# Patient Record
Sex: Male | Born: 1960 | Race: White | Hispanic: No | Marital: Married | Smoking: Former smoker
Health system: Southern US, Community
[De-identification: ages and names within clinical notes are randomized; demographics above are authoritative.]

## PROBLEM LIST (undated history)

## (undated) DIAGNOSIS — C801 Malignant (primary) neoplasm, unspecified: Secondary | ICD-10-CM

## (undated) HISTORY — PX: COLONOSCOPY: SHX174

---

## 1999-02-26 ENCOUNTER — Ambulatory Visit (HOSPITAL_COMMUNITY): Admission: RE | Admit: 1999-02-26 | Discharge: 1999-02-26 | Payer: Self-pay | Admitting: Family Medicine

## 1999-02-26 ENCOUNTER — Encounter: Payer: Self-pay | Admitting: Family Medicine

## 2017-07-05 DIAGNOSIS — J029 Acute pharyngitis, unspecified: Secondary | ICD-10-CM | POA: Diagnosis not present

## 2017-07-11 DIAGNOSIS — J029 Acute pharyngitis, unspecified: Secondary | ICD-10-CM | POA: Diagnosis not present

## 2017-07-11 DIAGNOSIS — H6123 Impacted cerumen, bilateral: Secondary | ICD-10-CM | POA: Diagnosis not present

## 2017-07-21 DIAGNOSIS — D649 Anemia, unspecified: Secondary | ICD-10-CM | POA: Diagnosis not present

## 2017-07-21 DIAGNOSIS — R7301 Impaired fasting glucose: Secondary | ICD-10-CM | POA: Diagnosis not present

## 2017-07-26 DIAGNOSIS — Z0000 Encounter for general adult medical examination without abnormal findings: Secondary | ICD-10-CM | POA: Diagnosis not present

## 2017-07-26 DIAGNOSIS — M1710 Unilateral primary osteoarthritis, unspecified knee: Secondary | ICD-10-CM | POA: Diagnosis not present

## 2017-07-26 DIAGNOSIS — R7301 Impaired fasting glucose: Secondary | ICD-10-CM | POA: Diagnosis not present

## 2018-01-27 DIAGNOSIS — R7301 Impaired fasting glucose: Secondary | ICD-10-CM | POA: Diagnosis not present

## 2018-01-27 DIAGNOSIS — Z0000 Encounter for general adult medical examination without abnormal findings: Secondary | ICD-10-CM | POA: Diagnosis not present

## 2018-01-27 DIAGNOSIS — D509 Iron deficiency anemia, unspecified: Secondary | ICD-10-CM | POA: Diagnosis not present

## 2018-02-02 DIAGNOSIS — M2550 Pain in unspecified joint: Secondary | ICD-10-CM | POA: Diagnosis not present

## 2018-02-02 DIAGNOSIS — R7301 Impaired fasting glucose: Secondary | ICD-10-CM | POA: Diagnosis not present

## 2018-02-02 DIAGNOSIS — Z23 Encounter for immunization: Secondary | ICD-10-CM | POA: Diagnosis not present

## 2018-02-02 DIAGNOSIS — D649 Anemia, unspecified: Secondary | ICD-10-CM | POA: Diagnosis not present

## 2018-02-23 DIAGNOSIS — X32XXXA Exposure to sunlight, initial encounter: Secondary | ICD-10-CM | POA: Diagnosis not present

## 2018-02-23 DIAGNOSIS — L280 Lichen simplex chronicus: Secondary | ICD-10-CM | POA: Diagnosis not present

## 2018-02-23 DIAGNOSIS — L570 Actinic keratosis: Secondary | ICD-10-CM | POA: Diagnosis not present

## 2019-03-28 ENCOUNTER — Other Ambulatory Visit: Payer: Self-pay | Admitting: Pharmacist

## 2019-12-12 ENCOUNTER — Other Ambulatory Visit: Payer: Self-pay

## 2019-12-12 ENCOUNTER — Encounter (HOSPITAL_COMMUNITY): Payer: Self-pay

## 2019-12-12 ENCOUNTER — Inpatient Hospital Stay (HOSPITAL_COMMUNITY): Payer: 59 | Attending: Hematology | Admitting: Hematology

## 2019-12-12 ENCOUNTER — Inpatient Hospital Stay (HOSPITAL_COMMUNITY): Payer: 59

## 2019-12-12 ENCOUNTER — Encounter (HOSPITAL_COMMUNITY): Payer: Self-pay | Admitting: *Deleted

## 2019-12-12 ENCOUNTER — Encounter (HOSPITAL_COMMUNITY): Payer: Self-pay | Admitting: Hematology

## 2019-12-12 ENCOUNTER — Telehealth (HOSPITAL_COMMUNITY): Payer: Self-pay | Admitting: *Deleted

## 2019-12-12 VITALS — BP 124/70 | HR 88 | Temp 98.0°F | Resp 18 | Wt 226.2 lb

## 2019-12-12 DIAGNOSIS — D649 Anemia, unspecified: Secondary | ICD-10-CM | POA: Diagnosis not present

## 2019-12-12 DIAGNOSIS — Z8052 Family history of malignant neoplasm of bladder: Secondary | ICD-10-CM | POA: Insufficient documentation

## 2019-12-12 DIAGNOSIS — Z79899 Other long term (current) drug therapy: Secondary | ICD-10-CM | POA: Insufficient documentation

## 2019-12-12 DIAGNOSIS — D539 Nutritional anemia, unspecified: Secondary | ICD-10-CM | POA: Insufficient documentation

## 2019-12-12 DIAGNOSIS — R0602 Shortness of breath: Secondary | ICD-10-CM | POA: Insufficient documentation

## 2019-12-12 DIAGNOSIS — D696 Thrombocytopenia, unspecified: Secondary | ICD-10-CM | POA: Diagnosis not present

## 2019-12-12 DIAGNOSIS — R5383 Other fatigue: Secondary | ICD-10-CM | POA: Insufficient documentation

## 2019-12-12 LAB — CBC WITH DIFFERENTIAL/PLATELET
Band Neutrophils: 6 %
Basophils Absolute: 0 10*3/uL (ref 0.0–0.1)
Basophils Relative: 0 %
Blasts: 13 %
Eosinophils Absolute: 0 10*3/uL (ref 0.0–0.5)
Eosinophils Relative: 0 %
HCT: 18.5 % — ABNORMAL LOW (ref 39.0–52.0)
Hemoglobin: 6 g/dL — CL (ref 13.0–17.0)
Lymphocytes Relative: 24 %
Lymphs Abs: 2.5 10*3/uL (ref 0.7–4.0)
MCH: 42.9 pg — ABNORMAL HIGH (ref 26.0–34.0)
MCHC: 32.4 g/dL (ref 30.0–36.0)
MCV: 132.1 fL — ABNORMAL HIGH (ref 80.0–100.0)
Metamyelocytes Relative: 9 %
Monocytes Absolute: 0.8 10*3/uL (ref 0.1–1.0)
Monocytes Relative: 8 %
Myelocytes: 13 %
Neutro Abs: 2.7 10*3/uL (ref 1.7–7.7)
Neutrophils Relative %: 20 %
Platelets: 53 10*3/uL — ABNORMAL LOW (ref 150–400)
Promyelocytes Relative: 7 %
RBC: 1.4 MIL/uL — ABNORMAL LOW (ref 4.22–5.81)
RDW: 18.6 % — ABNORMAL HIGH (ref 11.5–15.5)
WBC: 10.3 10*3/uL (ref 4.0–10.5)
nRBC: 0 % (ref 0.0–0.2)

## 2019-12-12 LAB — SAVE SMEAR(SSMR), FOR PROVIDER SLIDE REVIEW

## 2019-12-12 LAB — RETICULOCYTES
Immature Retic Fract: 28.1 % — ABNORMAL HIGH (ref 2.3–15.9)
RBC.: 1.41 MIL/uL — ABNORMAL LOW (ref 4.22–5.81)
Retic Count, Absolute: 38.6 10*3/uL (ref 19.0–186.0)
Retic Ct Pct: 2.7 % (ref 0.4–3.1)

## 2019-12-12 LAB — DIRECT ANTIGLOBULIN TEST (NOT AT ARMC)
DAT, IgG: NEGATIVE
DAT, complement: NEGATIVE

## 2019-12-12 LAB — FOLATE: Folate: 26.9 ng/mL (ref >5.9)

## 2019-12-12 LAB — LACTATE DEHYDROGENASE: LDH: 311 U/L — ABNORMAL HIGH (ref 98–192)

## 2019-12-12 LAB — MAGNESIUM: Magnesium: 2 mg/dL (ref 1.7–2.4)

## 2019-12-12 LAB — ABO/RH: ABO/RH(D): O POS

## 2019-12-12 LAB — FIBRINOGEN: Fibrinogen: 338 mg/dL (ref 210–475)

## 2019-12-12 LAB — VITAMIN B12: Vitamin B-12: 388 pg/mL (ref 180–914)

## 2019-12-12 LAB — PREPARE RBC (CROSSMATCH)

## 2019-12-12 LAB — URIC ACID: Uric Acid, Serum: 5.3 mg/dL (ref 3.7–8.6)

## 2019-12-12 LAB — VITAMIN D 25 HYDROXY (VIT D DEFICIENCY, FRACTURES): Vit D, 25-Hydroxy: 40.54 ng/mL (ref 30–100)

## 2019-12-12 MED ORDER — SODIUM CHLORIDE 0.9% FLUSH: 10.0000 mL | INTRAVENOUS | Status: DC | PRN

## 2019-12-12 MED ORDER — ACETAMINOPHEN 325 MG PO TABS
650.0000 mg | ORAL_TABLET | Freq: Once | ORAL | Status: AC
  Administered 2019-12-12: 650 mg via ORAL

## 2019-12-12 MED ORDER — HEPARIN SOD (PORK) LOCK FLUSH 100 UNIT/ML IV SOLN: 250.0000 [IU] | INTRAVENOUS | Status: DC | PRN

## 2019-12-12 MED ORDER — SODIUM CHLORIDE 0.9% FLUSH: 3.0000 mL | INTRAVENOUS | Status: DC | PRN

## 2019-12-12 MED ORDER — ACETAMINOPHEN 325 MG PO TABS
ORAL_TABLET | ORAL | Status: AC
  Filled 2019-12-12: qty 2

## 2019-12-12 MED ORDER — DIPHENHYDRAMINE HCL 25 MG PO CAPS
25.0000 mg | ORAL_CAPSULE | Freq: Once | ORAL | Status: AC
  Administered 2019-12-12: 25 mg via ORAL

## 2019-12-12 MED ORDER — HEPARIN SOD (PORK) LOCK FLUSH 100 UNIT/ML IV SOLN: 500.0000 [IU] | Freq: Every day | INTRAVENOUS | Status: DC | PRN

## 2019-12-12 MED ORDER — DIPHENHYDRAMINE HCL 25 MG PO CAPS
ORAL_CAPSULE | ORAL | Status: AC
  Filled 2019-12-12: qty 1

## 2019-12-12 MED ORDER — SODIUM CHLORIDE 0.9% IV SOLUTION
250.0000 mL | Freq: Once | INTRAVENOUS | Status: AC
  Administered 2019-12-12: 250 mL via INTRAVENOUS

## 2019-12-12 NOTE — Progress Notes (Signed)
CONSULT NOTE  Patient Care Team: Celene Squibb, MD as PCP - General (Internal Medicine)  CHIEF COMPLAINTS/PURPOSE OF CONSULTATION: Anemia  HISTORY OF PRESENTING ILLNESS:  Hunter Smith 59 y.o. male was sent here by his PCP for anemia.  Patient went to his PCP for increasing shortness of breath and fatigue.  Labs were drawn and hemoglobin was 6.8.  He was sent here for a consult.  Patient denies any bleeding issues such as hematochezia, hematuria or epistaxis.  He does report he is more short of breath with any type of exertion.  He also has occasional chest pain with the exertion.  He reports he has been so fatigued that he is unable to do any physical activity lately.  He reports his job is very labor-intensive.  He denies any history of blood clots or ever needing a blood transfusion.  He only drinks alcohol socially.  He denies any smoking or illegal drugs.  He denies any B symptoms including fevers, chills, night sweats or unexplained weight loss.  He reports he did lose a few weight by diet recently.  But the weight loss was intentional.  He denies any nausea vomiting or diarrhea.  He denies any over-the-counter NSAIDs or antiplatelet agents.  He reports a family history of a father with bladder cancer.  He lives at home with his wife and performs all of his own ADLs.   MEDICAL HISTORY:  History reviewed. No pertinent past medical history.  SURGICAL HISTORY: Past Surgical History:  Procedure Laterality Date  . COLONOSCOPY      SOCIAL HISTORY: Social History   Socioeconomic History  . Marital status: Married    Spouse name: Not on file  . Number of children: Not on file  . Years of education: Not on file  . Highest education level: Not on file  Occupational History  . Occupation: Employed  Tobacco Use  . Smoking status: Former Smoker    Types: Cigarettes    Quit date: 05/03/1988    Years since quitting: 31.6  . Smokeless tobacco: Never Used  Substance and Sexual Activity  .  Alcohol use: Yes    Alcohol/week: 1.0 - 2.0 standard drink    Types: 1 - 2 Standard drinks or equivalent per week  . Drug use: Never  . Sexual activity: Yes  Other Topics Concern  . Not on file  Social History Narrative  . Not on file   Social Determinants of Health   Financial Resource Strain:   . Difficulty of Paying Living Expenses:   Food Insecurity:   . Worried About Charity fundraiser in the Last Year:   . Arboriculturist in the Last Year:   Transportation Needs:   . Film/video editor (Medical):   Marland Kitchen Lack of Transportation (Non-Medical):   Physical Activity:   . Days of Exercise per Week:   . Minutes of Exercise per Session:   Stress:   . Feeling of Stress :   Social Connections:   . Frequency of Communication with Friends and Family:   . Frequency of Social Gatherings with Friends and Family:   . Attends Religious Services:   . Active Member of Clubs or Organizations:   . Attends Archivist Meetings:   Marland Kitchen Marital Status:   Intimate Partner Violence:   . Fear of Current or Ex-Partner:   . Emotionally Abused:   Marland Kitchen Physically Abused:   . Sexually Abused:     FAMILY HISTORY: Family History  Problem Relation Age of Onset  . Bladder Cancer Father   . Scoliosis Sister     ALLERGIES:  has No Known Allergies.  MEDICATIONS:  Current Outpatient Medications  Medication Sig Dispense Refill  . ferrous sulfate 324 MG TBEC Take 324 mg by mouth.    . Multiple Vitamin (MULTIVITAMIN) tablet Take 1 tablet by mouth daily.     No current facility-administered medications for this visit.   Facility-Administered Medications Ordered in Other Visits  Medication Dose Route Frequency Provider Last Rate Last Admin  . 0.9 %  sodium chloride infusion (Manually program via Guardrails IV Fluids)  250 mL Intravenous Once Lockamy, Randi L, NP-C      . heparin lock flush 100 unit/mL  250 Units Intracatheter PRN Lockamy, Randi L, NP-C      . heparin lock flush 100 unit/mL   500 Units Intracatheter Daily PRN Lockamy, Randi L, NP-C      . sodium chloride flush (NS) 0.9 % injection 10 mL  10 mL Intracatheter PRN Lockamy, Randi L, NP-C      . sodium chloride flush (NS) 0.9 % injection 3 mL  3 mL Intracatheter PRN Lockamy, Randi L, NP-C        REVIEW OF SYSTEMS:   Constitutional: Denies fevers, chills or abnormal night sweats, +headaches Respiratory: Denies cough, wheezes, +SOB Cardiovascular: Denies palpitation, chest discomfort or lower extremity swelling Gastrointestinal:  Denies nausea, heartburn or change in bowel habits Skin: Denies abnormal skin rashes Lymphatics: Denies new lymphadenopathy or easy bruising Neurological:Denies numbness, tingling or new weaknesses Behavioral/Psych: Mood is stable, no new changes  All other systems were reviewed with the patient and are negative.  PHYSICAL EXAMINATION: ECOG PERFORMANCE STATUS: 1 - Symptomatic but completely ambulatory  Vitals:   12/12/19 1148  BP: 130/74  Pulse: (!) 101  Resp: 18  Temp: 98.6 F (37 C)  SpO2: 99%   Filed Weights   12/12/19 1148  Weight: 226 lb 3.2 oz (102.6 kg)    GENERAL:alert, no distress and comfortable SKIN: skin color, texture, turgor are normal, no rashes or significant lesions NECK: supple, thyroid normal size, non-tender, without nodularity LYMPH:  no palpable lymphadenopathy in the cervical, axillary or inguinal LUNGS: clear to auscultation and percussion with normal breathing effort HEART: regular rate & rhythm and no murmurs and no lower extremity edema ABDOMEN:abdomen soft, non-tender and normal bowel sounds Musculoskeletal:no cyanosis of digits and no clubbing  PSYCH: alert & oriented x 3 with fluent speech NEURO: no focal motor/sensory deficits  LABORATORY DATA:  I have reviewed the data as listed No results found for this or any previous visit (from the past 2160 hour(s)).  RADIOGRAPHIC STUDIES: I have personally reviewed the radiological images as listed  and agreed with the findings in the report.  ASSESSMENT:  1.  Severe macrocytic anemia: -Patient seen at the request of Dr. Nevada Crane for severe anemia. -Patient reports 28-month history of worsening shortness of breath. -CBC from 11/30/2019 shows hemoglobin 6.8, MCV 119.  White count was 7.2 and platelet count was 45. -Differential showed 20% neutrophils, 51% lymphocytes, 16% monocytes, 0% eosinophils and 1% basophils.  7% metamyelocytes, 3% myelocytes and 2% blasts/blast-like cells seen.  ANC was 1.4. -Ferritin was 638, percent saturation was 93, TIBC was 238.  LFTs were normal.  Calcium was 8.5. -He lost about 10 pounds but was trying to lose weight.  Denies any fevers or infections. -He works at [employer] and denies any chemical exposure.  He is a non-smoker. -Family  history significant for father with bladder cancer. -Patient reported having an episode of blood in the semen about a year ago but had cystoscopy which was normal.   PLAN:  1.  Macrocytic anemia: -I have reviewed labs from Dr. Juel Burrow office from 11/30/2019 and discussed with the patient. -We will repeat CBC today and review smear.  We will check LDH, uric acid, fibrinogen levels.  We will check for flow cytometry.  Will evaluate for hemolysis by checking direct antiglobulin test, reticulocyte count.  We will also check BCR/ABL by FISH to evaluate for left shift. -As he is symptomatic, we will give him 1 unit of blood transfusion today. -He will require to be out of work until next week because of severe fatigue and shortness of breath. -He will likely need bone marrow aspiration and biopsy to rule out bone marrow infiltrative process like leukemia.  We will see him back in 3 to 4 days to discuss results.  2.  Thrombocytopenia: -Platelet count is 45.  Denies any easy bruising or bleeding. -Differential diagnosis includes bone marrow infiltrative process.   All questions were answered. The patient knows to call the clinic with  any problems, questions or concerns.     Derek Jack, MD 12/12/19 1:52 PM

## 2019-12-12 NOTE — Telephone Encounter (Signed)
.  CRITICAL VALUE ALERT Critical value received:  Hgb 6.0 Date of notification:  12/12/19 Time of notification: 2379 Critical value read back:  Hgb 6.0 Nurse who received alert:  Acquanetta Chain, LPN MD notified time and response: Francene Finders, NP [phone]. Pt to receive 1 unit of blood today in the clinic. Will continue to monitor at this time.

## 2019-12-13 LAB — TYPE AND SCREEN
ABO/RH(D): O POS
Antibody Screen: NEGATIVE
Unit division: 0

## 2019-12-13 LAB — PROTEIN ELECTROPHORESIS, SERUM
A/G Ratio: 1.3 (ref 0.7–1.7)
Albumin ELP: 3.7 g/dL (ref 2.9–4.4)
Alpha-1-Globulin: 0.2 g/dL (ref 0.0–0.4)
Alpha-2-Globulin: 0.6 g/dL (ref 0.4–1.0)
Beta Globulin: 1 g/dL (ref 0.7–1.3)
Gamma Globulin: 1.1 g/dL (ref 0.4–1.8)
Globulin, Total: 2.9 g/dL (ref 2.2–3.9)
Total Protein ELP: 6.6 g/dL (ref 6.0–8.5)

## 2019-12-13 LAB — BPAM RBC
Blood Product Expiration Date: 202109062359
ISSUE DATE / TIME: 202108041456
Unit Type and Rh: 5100

## 2019-12-13 LAB — HAPTOGLOBIN: Haptoglobin: 47 mg/dL (ref 29–370)

## 2019-12-13 LAB — PATHOLOGIST SMEAR REVIEW

## 2019-12-13 LAB — SURGICAL PATHOLOGY

## 2019-12-14 LAB — COPPER, SERUM: Copper: 94 ug/dL (ref 69–132)

## 2019-12-14 NOTE — Progress Notes (Signed)
Late entry- blood infusion stopped at 1645 on 12-12-2019.  Unable to chart this under the blood administration flow sheet.   Late entry- One unit of blood given per orders. Patient tolerated it well without problems. Vitals stable and discharged home from clinic ambulatory. Follow up as scheduled.

## 2019-12-17 ENCOUNTER — Encounter (HOSPITAL_COMMUNITY): Payer: Self-pay | Admitting: *Deleted

## 2019-12-17 ENCOUNTER — Other Ambulatory Visit (HOSPITAL_COMMUNITY): Payer: Self-pay | Admitting: *Deleted

## 2019-12-17 DIAGNOSIS — D696 Thrombocytopenia, unspecified: Secondary | ICD-10-CM

## 2019-12-17 DIAGNOSIS — D649 Anemia, unspecified: Secondary | ICD-10-CM

## 2019-12-17 DIAGNOSIS — D539 Nutritional anemia, unspecified: Secondary | ICD-10-CM

## 2019-12-17 LAB — METHYLMALONIC ACID, SERUM: Methylmalonic Acid, Quantitative: 144 nmol/L (ref 0–378)

## 2019-12-17 NOTE — Progress Notes (Signed)
Patient called clinic with questions about his upcoming appointment.  He states that he is very anxious/nervous about the unknown.  He says that his stomach is in knots. He wants to know if pepto bismol tablets is safe for him to continue taking. I have advised that he can continue taking it.  He asked advice on boost/ensure and I advised him to drink the ones high in protein/calories if he feels like he isn't getting enough nutrition.  He does state that he gets winded easily with stairs and feels like he may need another blood transfusion.  I told him that he would get labs tomorrow prior to his appointment with Dr. Delton Coombes and we will assess his levels at that time. Patient states that he feels better knowing that more answers will be provided at tomorrow's visit.

## 2019-12-17 NOTE — Progress Notes (Signed)
Orders placed for blood bank sample for tomorrow.

## 2019-12-18 ENCOUNTER — Other Ambulatory Visit: Payer: Self-pay

## 2019-12-18 ENCOUNTER — Inpatient Hospital Stay (HOSPITAL_BASED_OUTPATIENT_CLINIC_OR_DEPARTMENT_OTHER): Payer: 59 | Admitting: Hematology

## 2019-12-18 ENCOUNTER — Inpatient Hospital Stay (HOSPITAL_COMMUNITY): Payer: 59

## 2019-12-18 VITALS — BP 147/81 | HR 91 | Temp 97.7°F | Resp 18 | Wt 222.4 lb

## 2019-12-18 DIAGNOSIS — D649 Anemia, unspecified: Secondary | ICD-10-CM

## 2019-12-18 DIAGNOSIS — D696 Thrombocytopenia, unspecified: Secondary | ICD-10-CM

## 2019-12-18 DIAGNOSIS — D539 Nutritional anemia, unspecified: Secondary | ICD-10-CM | POA: Diagnosis not present

## 2019-12-18 LAB — CBC WITH DIFFERENTIAL/PLATELET
Basophils Absolute: 0 10*3/uL (ref 0.0–0.1)
Basophils Relative: 0 %
Blasts: 5 %
Eosinophils Absolute: 0 10*3/uL (ref 0.0–0.5)
Eosinophils Relative: 0 %
HCT: 21.2 % — ABNORMAL LOW (ref 39.0–52.0)
Hemoglobin: 7 g/dL — ABNORMAL LOW (ref 13.0–17.0)
Lymphocytes Relative: 23 %
Lymphs Abs: 3.3 10*3/uL (ref 0.7–4.0)
MCH: 39.8 pg — ABNORMAL HIGH (ref 26.0–34.0)
MCHC: 33 g/dL (ref 30.0–36.0)
MCV: 120.5 fL — ABNORMAL HIGH (ref 80.0–100.0)
Metamyelocytes Relative: 18 %
Monocytes Absolute: 2.6 10*3/uL — ABNORMAL HIGH (ref 0.1–1.0)
Monocytes Relative: 18 %
Myelocytes: 11 %
Neutro Abs: 3 10*3/uL (ref 1.7–7.7)
Neutrophils Relative %: 21 %
Platelets: 51 10*3/uL — ABNORMAL LOW (ref 150–400)
Promyelocytes Relative: 4 %
RBC: 1.76 MIL/uL — ABNORMAL LOW (ref 4.22–5.81)
RDW: 24.8 % — ABNORMAL HIGH (ref 11.5–15.5)
WBC: 14.5 10*3/uL — ABNORMAL HIGH (ref 4.0–10.5)
nRBC: 0.1 % (ref 0.0–0.2)

## 2019-12-18 LAB — COMPREHENSIVE METABOLIC PANEL
ALT: 34 U/L (ref 0–44)
AST: 30 U/L (ref 15–41)
Albumin: 4.5 g/dL (ref 3.5–5.0)
Alkaline Phosphatase: 46 U/L (ref 38–126)
Anion gap: 6 (ref 5–15)
BUN: 20 mg/dL (ref 6–20)
CO2: 24 mmol/L (ref 22–32)
Calcium: 8.7 mg/dL — ABNORMAL LOW (ref 8.9–10.3)
Chloride: 106 mmol/L (ref 98–111)
Creatinine, Ser: 1.11 mg/dL (ref 0.61–1.24)
GFR calc Af Amer: >60 mL/min (ref >60)
GFR calc non Af Amer: >60 mL/min (ref >60)
Glucose, Bld: 110 mg/dL — ABNORMAL HIGH (ref 70–99)
Potassium: 4.4 mmol/L (ref 3.5–5.1)
Sodium: 136 mmol/L (ref 135–145)
Total Bilirubin: 0.7 mg/dL (ref 0.3–1.2)
Total Protein: 6.9 g/dL (ref 6.5–8.1)

## 2019-12-18 LAB — SAMPLE TO BLOOD BANK

## 2019-12-18 NOTE — Progress Notes (Signed)
[place], [place] [address]   CLINIC:  Medical Oncology/Hematology  PCP:  Celene Squibb, MD [address] [place] [address]  [phone]  REASON FOR VISIT:  Follow-up for macrocytic anemia and thrombocytopenia  PRIOR THERAPY: None  CURRENT THERAPY: Intermittent blood transfusion  INTERVAL HISTORY:  [name], a 59 y.o. male, returns for routine follow-up for his macrocytic anemia and thrombocytopenia. Gerrell was last seen on 12/12/2019.  Today he is accompanied by his wife. He has not been over-exerting himself and monitors his BP every day; he gets SOB with even mild exertion. His appetite has decreased and he drinks Ensure to maintain his weight. He denies having any hematochezia, hematuria, or nosebleeds. He denies having any MI or CVA.  He saw Dr. Florene Glen at [facility] at 12/14/19.   REVIEW OF SYSTEMS:  Review of Systems  Constitutional: Positive for appetite change (mildly decreased) and fatigue (mild).  HENT:   Negative for nosebleeds.   Respiratory: Positive for shortness of breath (w/ exertion).   Cardiovascular: Positive for chest pain.  Gastrointestinal: Negative for blood in stool.  Genitourinary: Negative for hematuria.   All other systems reviewed and are negative.   PAST MEDICAL/SURGICAL HISTORY:  No past medical history on file. Past Surgical History:  Procedure Laterality Date  . COLONOSCOPY      SOCIAL HISTORY:  Social History   Socioeconomic History  . Marital status: Married    Spouse name: Not on file  . Number of children: Not on file  . Years of education: Not on file  . Highest education level: Not on file  Occupational History  . Occupation: Employed  Tobacco Use  . Smoking status: Former Smoker    Types: Cigarettes    Quit date: 05/03/1988    Years since quitting: 31.6  . Smokeless tobacco: Never Used  Substance and Sexual Activity  . Alcohol use: Yes    Alcohol/week: 1.0 - 2.0  standard drink    Types: 1 - 2 Standard drinks or equivalent per week  . Drug use: Never  . Sexual activity: Yes  Other Topics Concern  . Not on file  Social History Narrative  . Not on file   Social Determinants of Health   Financial Resource Strain:   . Difficulty of Paying Living Expenses:   Food Insecurity:   . Worried About Charity fundraiser in the Last Year:   . Arboriculturist in the Last Year:   Transportation Needs:   . Film/video editor (Medical):   Marland Kitchen Lack of Transportation (Non-Medical):   Physical Activity:   . Days of Exercise per Week:   . Minutes of Exercise per Session:   Stress:   . Feeling of Stress :   Social Connections:   . Frequency of Communication with Friends and Family:   . Frequency of Social Gatherings with Friends and Family:   . Attends Religious Services:   . Active Member of Clubs or Organizations:   . Attends Archivist Meetings:   Marland Kitchen Marital Status:   Intimate Partner Violence:   . Fear of Current or Ex-Partner:   . Emotionally Abused:   Marland Kitchen Physically Abused:   . Sexually Abused:     FAMILY HISTORY:  Family History  Problem Relation Age of Onset  . Bladder Cancer Father   . Scoliosis Sister     CURRENT MEDICATIONS:  Current Outpatient Medications  Medication Sig  Dispense Refill  . Multiple Vitamin (MULTIVITAMIN) tablet Take 1 tablet by mouth daily.     No current facility-administered medications for this visit.    ALLERGIES:  No Known Allergies  PHYSICAL EXAM:  Performance status (ECOG): 1 - Symptomatic but completely ambulatory  Vitals:   12/18/19 1554  BP: (!) 147/81  Pulse: 91  Resp: 18  Temp: 97.7 F (36.5 C)  SpO2: 98%   Wt Readings from Last 3 Encounters:  12/18/19 222 lb 6.4 oz (100.9 kg)  12/12/19 226 lb 3.2 oz (102.6 kg)   Physical Exam Vitals reviewed.  Constitutional:      Appearance: Normal appearance.  Cardiovascular:     Rate and Rhythm: Normal rate and regular rhythm.      Pulses: Normal pulses.     Heart sounds: Normal heart sounds.  Pulmonary:     Effort: Pulmonary effort is normal.     Breath sounds: Normal breath sounds.  Neurological:     General: No focal deficit present.     Mental Status: He is alert and oriented to person, place, and time.  Psychiatric:        Mood and Affect: Mood normal.        Behavior: Behavior normal.     LABORATORY DATA:  I have reviewed the labs as listed.  CBC Latest Ref Rng & Units 12/18/2019 12/12/2019  WBC 4.0 - 10.5 K/uL 14.5(H) 10.3  Hemoglobin 13.0 - 17.0 g/dL 7.0(L) 6.0(LL)  Hematocrit 39 - 52 % 21.2(L) 18.5(L)  Platelets 150 - 400 K/uL 51(L) 53(L)   CMP Latest Ref Rng & Units 12/18/2019  Glucose 70 - 99 mg/dL 110(H)  BUN 6 - 20 mg/dL 20  Creatinine 0.61 - 1.24 mg/dL 1.11  Sodium 135 - 145 mmol/L 136  Potassium 3.5 - 5.1 mmol/L 4.4  Chloride 98 - 111 mmol/L 106  CO2 22 - 32 mmol/L 24  Calcium 8.9 - 10.3 mg/dL 8.7(L)  Total Protein 6.5 - 8.1 g/dL 6.9  Total Bilirubin 0.3 - 1.2 mg/dL 0.7  Alkaline Phos 38 - 126 U/L 46  AST 15 - 41 U/L 30  ALT 0 - 44 U/L 34      Component Value Date/Time   RBC 1.76 (L) 12/18/2019 1457   MCV 120.5 (H) 12/18/2019 1457   MCH 39.8 (H) 12/18/2019 1457   MCHC 33.0 12/18/2019 1457   RDW 24.8 (H) 12/18/2019 1457   LYMPHSABS 3.3 12/18/2019 1457   MONOABS 2.6 (H) 12/18/2019 1457   EOSABS 0.0 12/18/2019 1457   BASOSABS 0.0 12/18/2019 1457   Lab Results  Component Value Date   VD25OH 40.54 12/12/2019    DIAGNOSTIC IMAGING:  I have independently reviewed the scans and discussed with the patient. No results found.   ASSESSMENT:  1.  Severe macrocytic anemia: -Patient seen at the request of Dr. Nevada Crane for severe anemia. -Patient reports 5-monthhistory of worsening shortness of breath. -CBC from 11/30/2019 shows hemoglobin 6.8, MCV 119.  White count was 7.2 and platelet count was 45. -Differential showed 20% neutrophils, 51% lymphocytes, 16% monocytes, 0% eosinophils and 1%  basophils.  7% metamyelocytes, 3% myelocytes and 2% blasts/blast-like cells seen.  ANC was 1.4. -Ferritin was 638, percent saturation was 93, TIBC was 238.  LFTs were normal.  Calcium was 8.5. -He lost about 10 pounds but was trying to lose weight.  Denies any fevers or infections. -He works at [employer] denies any chemical exposure.  He is a non-smoker. -Family history significant for  father with bladder cancer. -Patient reported having an episode of blood in the semen about a year ago but had cystoscopy which was normal.   PLAN:  1.  Macrocytic anemia: -He was evaluated by Dr. Florene Glen at [facility] and a bone marrow biopsy was done on 12/14/2019. 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 -We reviewed labs from today.  White count is 14.5 with predominantly monocytes.  Hemoglobin is 7.0 and platelet count is 51.  We will proceed with 1 unit of blood transfusion.  We will give irradiated blood. -I will get in touch with Dr. Florene Glen for final diagnosis.  We will see him back in 1 week for follow-up.  2.  Thrombocytopenia: -Denies any easy bruising or bleeding.  Platelet count today is 51.  No transfusion necessary.  Orders placed this encounter:  No orders of the defined types were placed in this encounter.    Derek Jack, MD [place] [phone]   I, Milinda Antis, am acting as a scribe for Dr. Sanda Linger.  I, Derek Jack MD, have reviewed the above documentation for accuracy and completeness, and I agree with the above.

## 2019-12-19 ENCOUNTER — Inpatient Hospital Stay (HOSPITAL_COMMUNITY): Payer: 59

## 2019-12-19 ENCOUNTER — Encounter (HOSPITAL_COMMUNITY): Payer: Self-pay

## 2019-12-19 DIAGNOSIS — D649 Anemia, unspecified: Secondary | ICD-10-CM

## 2019-12-19 DIAGNOSIS — D539 Nutritional anemia, unspecified: Secondary | ICD-10-CM | POA: Diagnosis not present

## 2019-12-19 LAB — PREPARE RBC (CROSSMATCH)

## 2019-12-19 MED ORDER — DIPHENHYDRAMINE HCL 25 MG PO CAPS
25.0000 mg | ORAL_CAPSULE | Freq: Once | ORAL | Status: AC
  Administered 2019-12-19: 25 mg via ORAL
  Filled 2019-12-19: qty 1

## 2019-12-19 MED ORDER — ACETAMINOPHEN 325 MG PO TABS: 650.0000 mg | ORAL_TABLET | Freq: Once | ORAL | Status: DC

## 2019-12-19 MED ORDER — SODIUM CHLORIDE 0.9% FLUSH
10.0000 mL | INTRAVENOUS | Status: AC | PRN
  Administered 2019-12-19: 10 mL

## 2019-12-19 MED ORDER — SODIUM CHLORIDE 0.9% IV SOLUTION
250.0000 mL | Freq: Once | INTRAVENOUS | Status: AC
  Administered 2019-12-19: 250 mL via INTRAVENOUS

## 2019-12-19 NOTE — Progress Notes (Signed)
Patient tolerated blood transfusion with no complaints voiced.  Peripheral IV site clean and dry with good blood return noted.  No bruising or swelling noted at site.  VSs with discharge and left in satisfactory condition with family with no s/s of distress noted.

## 2019-12-20 LAB — BCR-ABL1 FISH
Cells Analyzed: 200
Cells Counted: 200

## 2019-12-20 LAB — TYPE AND SCREEN
ABO/RH(D): O POS
Antibody Screen: NEGATIVE
Unit division: 0

## 2019-12-20 LAB — BPAM RBC
Blood Product Expiration Date: 202108232359
ISSUE DATE / TIME: 202108111113
Unit Type and Rh: 5100

## 2019-12-20 MED ORDER — PEGFILGRASTIM-JMDB 6 MG/0.6ML ~~LOC~~ SOSY
PREFILLED_SYRINGE | SUBCUTANEOUS | Status: AC
  Filled 2019-12-20: qty 0.6

## 2019-12-25 ENCOUNTER — Other Ambulatory Visit (HOSPITAL_COMMUNITY): Payer: Self-pay

## 2019-12-25 DIAGNOSIS — D696 Thrombocytopenia, unspecified: Secondary | ICD-10-CM

## 2019-12-25 DIAGNOSIS — D649 Anemia, unspecified: Secondary | ICD-10-CM

## 2019-12-25 DIAGNOSIS — D539 Nutritional anemia, unspecified: Secondary | ICD-10-CM

## 2019-12-26 ENCOUNTER — Inpatient Hospital Stay (HOSPITAL_COMMUNITY): Payer: 59

## 2019-12-26 ENCOUNTER — Inpatient Hospital Stay (HOSPITAL_BASED_OUTPATIENT_CLINIC_OR_DEPARTMENT_OTHER): Payer: 59 | Admitting: Hematology

## 2019-12-26 ENCOUNTER — Other Ambulatory Visit: Payer: Self-pay

## 2019-12-26 VITALS — BP 133/69 | HR 85 | Temp 97.2°F | Resp 18 | Wt 224.0 lb

## 2019-12-26 DIAGNOSIS — D46Z Other myelodysplastic syndromes: Secondary | ICD-10-CM | POA: Diagnosis not present

## 2019-12-26 DIAGNOSIS — D539 Nutritional anemia, unspecified: Secondary | ICD-10-CM | POA: Diagnosis not present

## 2019-12-26 DIAGNOSIS — D696 Thrombocytopenia, unspecified: Secondary | ICD-10-CM

## 2019-12-26 DIAGNOSIS — Z7189 Other specified counseling: Secondary | ICD-10-CM

## 2019-12-26 DIAGNOSIS — D649 Anemia, unspecified: Secondary | ICD-10-CM

## 2019-12-26 LAB — CBC WITH DIFFERENTIAL/PLATELET
Band Neutrophils: 4 %
Basophils Absolute: 0 10*3/uL (ref 0.0–0.1)
Basophils Relative: 0 %
Blasts: 3 %
Eosinophils Absolute: 0.2 10*3/uL (ref 0.0–0.5)
Eosinophils Relative: 1 %
HCT: 23.5 % — ABNORMAL LOW (ref 39.0–52.0)
Hemoglobin: 7.6 g/dL — ABNORMAL LOW (ref 13.0–17.0)
Lymphocytes Relative: 20 %
Lymphs Abs: 3.1 10*3/uL (ref 0.7–4.0)
MCH: 36.5 pg — ABNORMAL HIGH (ref 26.0–34.0)
MCHC: 32.3 g/dL (ref 30.0–36.0)
MCV: 113 fL — ABNORMAL HIGH (ref 80.0–100.0)
Metamyelocytes Relative: 12 %
Monocytes Absolute: 2.8 10*3/uL — ABNORMAL HIGH (ref 0.1–1.0)
Monocytes Relative: 18 %
Myelocytes: 16 %
Neutro Abs: 3.4 10*3/uL (ref 1.7–7.7)
Neutrophils Relative %: 18 %
Platelets: 44 10*3/uL — ABNORMAL LOW (ref 150–400)
Promyelocytes Relative: 8 %
RBC: 2.08 MIL/uL — ABNORMAL LOW (ref 4.22–5.81)
RDW: 27 % — ABNORMAL HIGH (ref 11.5–15.5)
WBC: 15.6 10*3/uL — ABNORMAL HIGH (ref 4.0–10.5)
nRBC: 0.1 % (ref 0.0–0.2)

## 2019-12-26 LAB — COMPREHENSIVE METABOLIC PANEL
ALT: 37 U/L (ref 0–44)
AST: 28 U/L (ref 15–41)
Albumin: 4 g/dL (ref 3.5–5.0)
Alkaline Phosphatase: 42 U/L (ref 38–126)
Anion gap: 7 (ref 5–15)
BUN: 18 mg/dL (ref 6–20)
CO2: 25 mmol/L (ref 22–32)
Calcium: 9 mg/dL (ref 8.9–10.3)
Chloride: 105 mmol/L (ref 98–111)
Creatinine, Ser: 1.06 mg/dL (ref 0.61–1.24)
GFR calc Af Amer: >60 mL/min (ref >60)
GFR calc non Af Amer: >60 mL/min (ref >60)
Glucose, Bld: 124 mg/dL — ABNORMAL HIGH (ref 70–99)
Potassium: 4 mmol/L (ref 3.5–5.1)
Sodium: 137 mmol/L (ref 135–145)
Total Bilirubin: 0.5 mg/dL (ref 0.3–1.2)
Total Protein: 6.8 g/dL (ref 6.5–8.1)

## 2019-12-26 NOTE — Patient Instructions (Addendum)
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  You will be treated in the clinic 5 days a week one week and on Monday and Tuesday of the following week.  You will repeat this process every 28 days/every 4 weeks.  The chemotherapy drug you'll be receiving is called azacitadine (Vidaza).  The intent of treatment is to control your disease, prevent it from growing or getting worse, and to alleviate any symptoms you may be having related to your disease.  You will see the doctor regularly throughout treatment. We will obtain blood work from you prior to every treatment and monitor your results to make sure it is safe to give your treatment. The doctor monitors your response to treatment by the way you are feeling, your blood work, and by obtaining scans periodically.  There will be wait times while you are here for treatment.  It will take about 30 minutes to 1 hour for your lab work to result.  Then there will be wait times while pharmacy mixes your medications.     Medication you will receive in the clinic prior to each Vidaza infusion:  Zofran - this is an anti-nausea medication that is given to help prevent nausea that may be caused by some chemotherapy drugs.   Azacitidine (Vidaza)  About This Drug Azacitidine is used to treat cancer. It is given in the vein (IV).  It will take 15 minutes to infuse.    Possible Side Effects . Bone marrow suppression. This is a decrease in the number of white blood cells, red blood cells, and platelets. This may raise your risk of infection, make you tired and weak, and raise your risk of bleeding.  . Fever and chills  . Nausea and vomiting (throwing up)  . Constipation (not able to move bowels)  . Diarrhea (loose bowel movements)  . Decreased potassium  . Weakness  . Bruising  . Skin and tissue irritation including redness, pain, warmth, or swelling at the injection site.  Marland Kitchen Petechiae. Tiny  red spots on the skin, often from low platelets.  Note: Each of the side effects above was reported in 30% or greater of patients treated with azacitidine. Not all possible side effects are included above.   Warnings and Precautions  . Risk of changes in your liver function if you have underlying liver disease.  . Changes in your kidney function, which can cause kidney failure and be life-threatening.  . Tumor lysis syndrome which can be life-threatening: This drug may act on the cancer cells very quickly. This may affect how your kidneys work.   Important Information  . This drug may be present in the saliva, tears, sweat, urine, stool, vomit, semen, and vaginal secretions. Talk to your doctor and/or your nurse about the necessary precautions to take during this time.   Treating Side Effects . Manage tiredness by pacing your activities for the day.  . Be sure to include periods of rest between energy-draining activities.  . To help decrease the risk of infections, wash your hands regularly.  . Avoid close contact with people who have a cold, the flu, or other infections.  . Take your temperature as your doctor or nurse tells you, and whenever you feel like you may have a fever.  . To help decrease the risk of bleeding, use a soft toothbrush. Check with your nurse before using dental floss.  . Be very careful when using knives or tools.  Marland Kitchen  Use an electric shaver instead of a razor.  . Drink plenty of fluids (a minimum of eight glasses per day is recommended).  . If you throw up or have diarrhea, you should drink more fluids so that you do not become dehydrated (lack of water in the body from losing too much fluid).  . To help with nausea and vomiting, eat small, frequent meals instead of three large meals a day. Choose foods and drinks that are at room temperature. Ask your nurse or doctor about other helpful tips and medicine that is available to help stop or lessen these  symptoms.  . If you have diarrhea, eat low-fiber foods that are high in protein and calories and avoid foods that can irritate your digestive tracts or lead to cramping.  . If you are not able to move your bowels, check with your doctor or nurse before you use enemas, laxatives, or suppositories.  . Ask your nurse or doctor about medicine that can lessen or stop your diarrhea and/or constipation.  Food and Drug Interactions . There are no known interactions of azacitidine with food.  . This drug may interact with other medicines. Tell your doctor and pharmacist about all the medicines and dietary supplements (vitamins, minerals, herbs, and others) that you are taking at this time. Also, check with your doctor or pharmacist before starting any new prescription or over-the-counter medicines, or dietary supplements to make sure that there are no interactions.  When to Call the Doctor  Call your doctor or nurse if you have any of these symptoms and/or any new or unusual symptoms: . Fever of 100.4 F (38 C) or higher  . Chills  . Tiredness that interferes with your daily activities  . Feeling dizzy or lightheaded  . Easy bleeding or bruising  . Nausea that stops you from eating or drinking and/or is not relieved by prescribed medicines  . Throwing up  . Diarrhea, 4 times in one day or diarrhea with lack of strength or a feeling of being dizzy  . No bowel movement in 3 days or when you feel uncomfortable  . Pain, redness, or swelling at the site of the injection  . Any new tiny red spots on the skin  . Decreased and/or dark urine  . Signs of possible liver problems: dark urine, pale bowel movements, pain in your abdomen, feeling very tired and weak, unusual itching, or yellowing of the eyes or skin  . Signs of tumor lysis: confusion or agitation, decreased urine, nausea/vomiting, diarrhea, muscle cramping, numbness and/or tingling, seizures  . If you think you may be pregnant  or may have impregnated your partner  Reproduction Warnings . Pregnancy warning: This drug can have harmful effects on the unborn baby. Women of childbearing potential and men with male partners of childbearing potential should use effective methods of birth control during your cancer treatment. Let your doctor know right away if you think you may be pregnant or may have impregnated your partner.  . Breastfeeding warning: It is not known if this drug passes into breast milk. Women should not breastfeed during treatment because this drug could enter the breast milk and cause harm to a breastfeeding baby.  . Fertility warning: In men and women both, this drug may affect your ability to have children in the future. Talk with your doctor or nurse if you plan to have children. Ask for information on sperm or egg banking.   SELF CARE ACTIVITIES WHILE RECEIVING CHEMOTHERAPY:  Hydration  Increase your fluid intake 48 hours prior to treatment and drink at least 8 to 12 cups (64 ounces) of water/decaffeinated beverages per day after treatment. You can still have your cup of coffee or soda but these beverages do not count as part of your 8 to 12 cups that you need to drink daily. No alcohol intake.  Medications Continue taking your normal prescription medication as prescribed.  If you start any new herbal or new supplements please let us know first to make sure it is safe.  Mouth Care Have teeth cleaned professionally before starting treatment. Keep dentures and partial plates clean. Use soft toothbrush and do not use mouthwashes that contain alcohol. Biotene is a good mouthwash that is available at most pharmacies or may be ordered by calling [phone]. Use warm salt water gargles (1 teaspoon salt per 1 quart warm water) before and after meals and at bedtime. If you need dental work, please let the doctor know before you go for your appointment so that we can coordinate the best possible time for  you in regards to your chemo regimen. You need to also let your dentist know that you are actively taking chemo. We may need to do labs prior to your dental appointment.  Skin Care Always use sunscreen that has not expired and with SPF (Sun Protection Factor) of 50 or higher. Wear hats to protect your head from the sun. Remember to use sunscreen on your hands, ears, face, & feet.  Use good moisturizing lotions such as udder cream, eucerin, or even Vaseline. Some chemotherapies can cause dry skin, color changes in your skin and nails.    . Avoid long, hot showers or baths. . Use gentle, fragrance-free soaps and laundry detergent. . Use moisturizers, preferably creams or ointments rather than lotions because the thicker consistency is better at preventing skin dehydration. Apply the cream or ointment within 15 minutes of showering. Reapply moisturizer at night, and moisturize your hands every time after you wash them.  Hair Loss (if your doctor says your hair will fall out)  . If your doctor says that your hair is likely to fall out, decide before you begin chemo whether you want to wear a wig. You may want to shop before treatment to match your hair color. . Hats, turbans, and scarves can also camouflage hair loss, although some people prefer to leave their heads uncovered. If you go bare-headed outdoors, be sure to use sunscreen on your scalp. . Cut your hair short. It eases the inconvenience of shedding lots of hair, but it also can reduce the emotional impact of watching your hair fall out. . Don't perm or color your hair during chemotherapy. Those chemical treatments are already damaging to hair and can enhance hair loss. Once your chemo treatments are done and your hair has grown back, it's OK to resume dyeing or perming hair.  With chemotherapy, hair loss is almost always temporary. But when it grows back, it may be a different color or texture. In older adults who still had hair color before  chemotherapy, the new growth may be completely gray.  Often, new hair is very fine and soft.  Infection Prevention Please wash your hands for at least 30 seconds using warm soapy water. Handwashing is the #1 way to prevent the spread of germs. Stay away from sick people or people who are getting over a cold. If you develop respiratory systems such as green/yellow mucus production or productive cough or persistent cough  let us know and we will see if you need an antibiotic. It is a good idea to keep a pair of gloves on when going into grocery stores/[employer] to decrease your risk of coming into contact with germs on the carts, etc. Carry alcohol hand gel with you at all times and use it frequently if out in public. If your temperature reaches 100.4 or higher please call the clinic and let us know.  If it is after hours or on the weekend please go to the ER if your temperature is over 100.4.  Please have your own personal thermometer at home to use.    Sex and bodily fluids If you are going to have sex, a condom must be used to protect the person that isn't taking chemotherapy. Chemo can decrease your libido (sex drive). For a few days after chemotherapy, chemotherapy can be excreted through your bodily fluids.  When using the toilet please close the lid and flush the toilet twice.  Do this for a few day after you have had chemotherapy.   Effects of chemotherapy on your sex life Some changes are simple and won't last long. They won't affect your sex life permanently.  Sometimes you may feel: . too tired . not strong enough to be very active . sick or sore  . not in the mood . anxious or low  Your anxiety might not seem related to sex. For example, you may be worried about the cancer and how your treatment is going. Or you may be worried about money, or about how you family are coping with your illness.  These things can cause stress, which can affect your interest in sex. It's important to talk to  your partner about how you feel.  Remember - the changes to your sex life don't usually last long. There's usually no medical reason to stop having sex during chemo. The drugs won't have any long term physical effects on your performance or enjoyment of sex. Cancer can't be passed on to your partner during sex  Contraception It's important to use reliable contraception during treatment. Avoid getting pregnant while you or your partner are having chemotherapy. This is because the drugs may harm the baby. Sometimes chemotherapy drugs can leave a man or woman infertile.  This means you would not be able to have children in the future. You might want to talk to someone about permanent infertility. It can be very difficult to learn that you may no longer be able to have children. Some people find counselling helpful. There might be ways to preserve your fertility, although this is easier for men than for women. You may want to speak to a fertility expert. You can talk about sperm banking or harvesting your eggs. You can also ask about other fertility options, such as donor eggs. If you have or have had breast cancer, your doctor might advise you not to take the contraceptive pill. This is because the hormones in it might affect the cancer. It is not known for sure whether or not chemotherapy drugs can be passed on through semen or secretions from the vagina. Because of this some doctors advise people to use a barrier method if you have sex during treatment. This applies to vaginal, anal or oral sex. Generally, doctors advise a barrier method only for the time you are actually having the treatment and for about a week after your treatment. Advice like this can be worrying, but this does not mean that you  have to avoid being intimate with your partner. You can still have close contact with your partner and continue to enjoy sex.  Animals If you have cats or birds we just ask that you not change the litter or change  the cage.  Please have someone else do this for you while you are on chemotherapy.   Food Safety During and After Cancer Treatment Food safety is important for people both during and after cancer treatment. Cancer and cancer treatments, such as chemotherapy, radiation therapy, and stem cell/bone marrow transplantation, often weaken the immune system. This makes it harder for your body to protect itself from foodborne illness, also called food poisoning. Foodborne illness is caused by eating food that contains harmful bacteria, parasites, or viruses.  Foods to avoid Some foods have a higher risk of becoming tainted with bacteria. These include: Marland Kitchen Unwashed fresh fruit and vegetables, especially leafy vegetables that can hide dirt and other contaminants . Raw sprouts, such as alfalfa sprouts . Raw or undercooked beef, especially ground beef, or other raw or undercooked meat and poultry . Fatty, fried, or spicy foods immediately before or after treatment.  These can sit heavy on your stomach and make you feel nauseous. . Raw or undercooked shellfish, such as oysters. . Sushi and sashimi, which often contain raw fish.  . Unpasteurized beverages, such as unpasteurized fruit juices, raw milk, raw yogurt, or cider . Undercooked eggs, such as soft boiled, over easy, and poached; raw, unpasteurized eggs; or foods made with raw egg, such as homemade raw cookie dough and homemade mayonnaise  Simple steps for food safety  Shop smart. . Do not buy food stored or displayed in an unclean area. . Do not buy bruised or damaged fruits or vegetables. . Do not buy cans that have cracks, dents, or bulges. . Pick up foods that can spoil at the end of your shopping trip and store them in a cooler on the way home.  Prepare and clean up foods carefully. . Rinse all fresh fruits and vegetables under running water, and dry them with a clean towel or paper towel. . Clean the top of cans before opening them. . After  preparing food, wash your hands for 20 seconds with hot water and soap. Pay special attention to areas between fingers and under nails. . Clean your utensils and dishes with hot water and soap. Marland Kitchen Disinfect your kitchen and cutting boards using 1 teaspoon of liquid, unscented bleach mixed into 1 quart of water.    Dispose of old food. . Eat canned and packaged food before its expiration date (the "use by" or "best before" date). . Consume refrigerated leftovers within 3 to 4 days. After that time, throw out the food. Even if the food does not smell or look spoiled, it still may be unsafe. Some bacteria, such as Listeria, can grow even on foods stored in the refrigerator if they are kept for too long.  Take precautions when eating out. . At restaurants, avoid buffets and salad bars where food sits out for a long time and comes in contact with many people. Food can become contaminated when someone with a virus, often a norovirus, or another "bug" handles it. . Put any leftover food in a "to-go" container yourself, rather than having the server do it. And, refrigerate leftovers as soon as you get home. . Choose restaurants that are clean and that are willing to prepare your food as you order it cooked.   AT HOME  MEDICATIONS:                                                                                                                                                                Compazine/Prochlorperazine 10mg  tablet. Take 1 tablet every 6 hours as needed for nausea/vomiting. (This can make you sleepy)   EMLA cream. Apply a quarter size amount to port site 1 hour prior to chemo. Do not rub in. Cover with plastic wrap.    Diarrhea Sheet   If you are having loose stools/diarrhea, please purchase Imodium and begin taking as outlined:  At the first sign of poorly formed or loose stools you should begin taking Imodium (loperamide) 2 mg capsules.  Take two tablets (4mg ) followed by one tablet (2mg )  every 2 hours - DO NOT EXCEED 8 tablets in 24 hours.  If it is bedtime and you are having loose stools, take 2 tablets at bedtime, then 2 tablets every 4 hours until morning.   Always call the [facility] if you are having loose stools/diarrhea that you can't get under control.  Loose stools/diarrhea leads to dehydration (loss of water) in your body.  We have other options of trying to get the loose stools/diarrhea to stop but you must let us know!   Constipation Sheet  Colace - 100 mg capsules - take 2 capsules daily.  If this doesn't help then you can increase to 2 capsules twice daily.  Please call if the above does not work for you. Do not go more than 2 days without a bowel movement.  It is very important that you do not become constipated.  It will make you feel sick to your stomach (nausea) and can cause abdominal pain and vomiting.  Nausea Sheet   Compazine/Prochlorperazine 10mg  tablet. Take 1 tablet every 6 hours as needed for nausea/vomiting (This can make you drowsy).  If you are having persistent nausea (nausea that does not stop) please call the [facility] and let us know the amount of nausea that you are experiencing.  If you begin to vomit, you need to call the [facility] and if it is the weekend and you have vomited more than one time and can't get it to stop-go to the Emergency Room.  Persistent nausea/vomiting can lead to dehydration (loss of fluid in your body) and will make you feel very weak and unwell. Ice chips, sips of clear liquids, foods that are at room temperature, crackers, and toast tend to be better tolerated.   SYMPTOMS TO REPORT AS SOON AS POSSIBLE AFTER TREATMENT:  FEVER GREATER THAN 100.4 F  CHILLS WITH OR WITHOUT FEVER  NAUSEA AND VOMITING THAT IS NOT CONTROLLED WITH YOUR NAUSEA MEDICATION  UNUSUAL SHORTNESS OF BREATH  UNUSUAL BRUISING OR BLEEDING  TENDERNESS  IN MOUTH AND THROAT WITH OR WITHOUT PRESENCE OF ULCERS  URINARY PROBLEMS  BOWEL  PROBLEMS  UNUSUAL RASH      Wear comfortable clothing and clothing appropriate for easy access to any Portacath or PICC line. Let us know if there is anything that we can do to make your therapy better!    What to do if you need assistance after hours or on the weekends: CALL [phone].  HOLD on the line, do not hang up.  You will hear multiple messages but at the end you will be connected with a nurse triage line.  They will contact the doctor if necessary.  Most of the time they will be able to assist you.  Do not call the hospital operator.      I have been informed and understand all of the instructions given to me and have received a copy. I have been instructed to call the clinic [phone] or my family physician as soon as possible for continued medical care, if indicated. I do not have any more questions at this time but understand that I may call the [facility] or the Patient Navigator at [phone] during office hours should I have questions or need assistance in obtaining follow-up care.

## 2019-12-26 NOTE — Progress Notes (Signed)
[place]unter [place], [place] [address]   CLINIC:  Medical Oncology/Hematology  PCP:  Celene Squibb, MD [address] Liana Crocker [place] [address]  [phone]  REASON FOR VISIT:  Follow-up for macrocytic anemia and thrombocytopenia  PRIOR THERAPY: None  CURRENT THERAPY: Intermittent blood transfusion  INTERVAL HISTORY:  Hunter Smith, a 59 y.o. male, returns for routine follow-up for his macrocytic anemia and thrombocytopenia. Hunter Smith was last seen on 12/18/2019.  Today he reports that he is feeling well after his blood transfusion on 8/10, though his energy levels have not improved that greatly. He denies having chest pain, though he gets chest tightness when he is exerting himself.  He received his first COVID vaccine was on 12/19/2019.  REVIEW OF SYSTEMS:  Review of Systems  Constitutional: Positive for appetite change (mildly decreased).  Respiratory: Positive for chest tightness (w/ exertion) and shortness of breath.   Cardiovascular: Positive for palpitations. Negative for chest pain.  Neurological: Positive for headaches.  All other systems reviewed and are negative.   PAST MEDICAL/SURGICAL HISTORY:  No past medical history on file. Past Surgical History:  Procedure Laterality Date  . COLONOSCOPY      SOCIAL HISTORY:  Social History   Socioeconomic History  . Marital status: Married    Spouse name: Not on file  . Number of children: Not on file  . Years of education: Not on file  . Highest education level: Not on file  Occupational History  . Occupation: Employed  Tobacco Use  . Smoking status: Former Smoker    Types: Cigarettes    Quit date: 05/03/1988    Years since quitting: 31.6  . Smokeless tobacco: Never Used  Substance and Sexual Activity  . Alcohol use: Yes    Alcohol/week: 1.0 - 2.0 standard drink    Types: 1 - 2 Standard drinks or equivalent per week  . Drug use: Never  . Sexual activity: Yes  Other Topics  Concern  . Not on file  Social History Narrative  . Not on file   Social Determinants of Health   Financial Resource Strain:   . Difficulty of Paying Living Expenses:   Food Insecurity:   . Worried About Charity fundraiser in the Last Year:   . Arboriculturist in the Last Year:   Transportation Needs:   . Film/video editor (Medical):   Marland Kitchen Lack of Transportation (Non-Medical):   Physical Activity:   . Days of Exercise per Week:   . Minutes of Exercise per Session:   Stress:   . Feeling of Stress :   Social Connections:   . Frequency of Communication with Friends and Family:   . Frequency of Social Gatherings with Friends and Family:   . Attends Religious Services:   . Active Member of Clubs or Organizations:   . Attends Archivist Meetings:   Marland Kitchen Marital Status:   Intimate Partner Violence:   . Fear of Current or Ex-Partner:   . Emotionally Abused:   Marland Kitchen Physically Abused:   . Sexually Abused:     FAMILY HISTORY:  Family History  Problem Relation Age of Onset  . Bladder Cancer Father   . Scoliosis Sister     CURRENT MEDICATIONS:  Current Outpatient Medications  Medication Sig Dispense Refill  . Multiple Vitamin (MULTIVITAMIN) tablet Take 1 tablet by mouth daily.     No current facility-administered medications for this visit.  ALLERGIES:  No Known Allergies  PHYSICAL EXAM:  Performance status (ECOG): 1 - Symptomatic but completely ambulatory  Vitals:   12/26/19 0953  BP: 133/69  Pulse: 85  Resp: 18  Temp: (!) 97.2 F (36.2 C)  SpO2: 100%   Wt Readings from Last 3 Encounters:  12/26/19 224 lb (101.6 kg)  12/19/19 220 lb 11.2 oz (100.1 kg)  12/18/19 222 lb 6.4 oz (100.9 kg)   Physical Exam Vitals reviewed.  Constitutional:      Appearance: Normal appearance. He is obese.  Cardiovascular:     Rate and Rhythm: Normal rate and regular rhythm.     Pulses: Normal pulses.     Heart sounds: Normal heart sounds.  Pulmonary:     Effort:  Pulmonary effort is normal.     Breath sounds: Normal breath sounds.  Abdominal:     Palpations: Abdomen is soft. There is no hepatomegaly, splenomegaly or mass.     Tenderness: There is no abdominal tenderness.     Hernia: No hernia is present.  Lymphadenopathy:     Cervical: No cervical adenopathy.     Upper Body:     Right upper body: No supraclavicular or axillary adenopathy.     Left upper body: No supraclavicular or axillary adenopathy.  Neurological:     General: No focal deficit present.     Mental Status: He is alert and oriented to person, place, and time.  Psychiatric:        Mood and Affect: Mood normal.        Behavior: Behavior normal.     LABORATORY DATA:  I have reviewed the labs as listed.  CBC Latest Ref Rng & Units 12/26/2019 12/18/2019 12/12/2019  WBC 4.0 - 10.5 K/uL 15.6(H) 14.5(H) 10.3  Hemoglobin 13.0 - 17.0 g/dL 7.6(L) 7.0(L) 6.0(LL)  Hematocrit 39 - 52 % 23.5(L) 21.2(L) 18.5(L)  Platelets 150 - 400 K/uL 44(L) 51(L) 53(L)   CMP Latest Ref Rng & Units 12/26/2019 12/18/2019  Glucose 70 - 99 mg/dL 767(M) 094(B)  BUN 6 - 20 mg/dL 18 20  Creatinine 0.96 - 1.24 mg/dL 2.83 6.62  Sodium 947 - 145 mmol/L 137 136  Potassium 3.5 - 5.1 mmol/L 4.0 4.4  Chloride 98 - 111 mmol/L 105 106  CO2 22 - 32 mmol/L 25 24  Calcium 8.9 - 10.3 mg/dL 9.0 6.5(Y)  Total Protein 6.5 - 8.1 g/dL 6.8 6.9  Total Bilirubin 0.3 - 1.2 mg/dL 0.5 0.7  Alkaline Phos 38 - 126 U/L 42 46  AST 15 - 41 U/L 28 30  ALT 0 - 44 U/L 37 34      Component Value Date/Time   RBC 2.08 (L) 12/26/2019 0914   MCV 113.0 (H) 12/26/2019 0914   MCH 36.5 (H) 12/26/2019 0914   MCHC 32.3 12/26/2019 0914   RDW 27.0 (H) 12/26/2019 0914   LYMPHSABS 3.1 12/26/2019 0914   MONOABS 2.8 (H) 12/26/2019 0914   EOSABS 0.2 12/26/2019 0914   BASOSABS 0.0 12/26/2019 0914    DIAGNOSTIC IMAGING:  I have independently reviewed the scans and discussed with the patient. No results found.   ASSESSMENT:  1.  High-grade MDS  with excess blasts/CMML: -Presentation with pancytopenia and transfusion dependent anemia. -Bone marrow biopsy on 12/14/2019 at [facility] showed hypercellular marrow (90%) with dyserythropoiesis, dysgranulopoiesis and 16% blasts.  Findings consistent with MDS-EB 2.  FLT3 ITD/TKD negative. -Chromosome analysis 47, XY, +8 [19]/46, XY[1]. -MDS FISH panel pending. -However his last 2 CBC showing increased  white count of 14-15 K with peripheral blood monocytosis, also likely CMML. -He was evaluated by Dr. Jerrye Noble at [facility].  Bone marrow biopsy after cycle 2 at [facility] recommended.   PLAN:  1.  High-grade MDS: -I have reached out to Dr. Florene Glen at [facility].  Unfortunately there are no frontline clinical trials available for this patient. -We have recommended first-line therapy with azacitidine (75 mg per metered square) 5-2-2 regimen. -We discussed the side effects and regimen in detail. -He will have a bone marrow biopsy done after cycle 2 at [facility]. -We will likely start his chemotherapy next Monday.  I have also discussed the need for port placement.  We will make referral to our surgical colleagues.  2. Thrombocytopenia: -Denies any easy bruising or bleeding.  Platelet count at presentation between 50-60 K.  Today it has dropped to 40 4K.  3.  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  As he is a candidate for transplant, we will use irradiated blood products only.  Orders placed this encounter:  No orders of the defined types were placed in this encounter.    Derek Jack, MD [place] [phone]   I, Milinda Antis, am acting as a scribe for Dr. Sanda Linger.  I, Derek Jack MD, have reviewed the above documentation for accuracy and completeness, and I agree with the above.

## 2019-12-26 NOTE — Patient Instructions (Signed)
[facility] at [facility] Discharge Instructions  You were see today by Dr. Katragadda.  He talked with you about your recent labs/procedures by Dr. Powell.  He talked about the recommendations of using Vidaza.  It is given 7 days in a row.  So it will be given Monday through Friday and then Monday and Tuesday the next week.  It is given as an infusion.  We will do this every 28 days.  You may experience but not limited to: tiredness, decrease in white blood cell count, diarrhea, and nausea.    We will send you to a local surgeon here in town to have them place a port a cath.  This is the safest way to administer treatments.  We will plan to start treatments next Monday. It may be that we start with IV treatment until the port is placed.   Dr. Katragadda discussed with you the need to start treatment to get you into remission before we move forward with talking about transplant.    We will do another bone marrow biopsy after 2 cycles.  We will get Dr. Powell to do this at [facility].     Thank you for choosing  Cancer Center at [facility] to provide your oncology and hematology care.  To afford each patient quality time with our provider, please arrive at least 15 minutes before your scheduled appointment time.   If you have a lab appointment with the Cancer Center please come in thru the Main Entrance and check in at the main information desk.  You need to re-schedule your appointment should you arrive 10 or more minutes late.  We strive to give you quality time with our providers, and arriving late affects you and other patients whose appointments are after yours.  Also, if you no show three or more times for appointments you may be dismissed from the clinic at the providers discretion.     Again, thank you for choosing [facility].  Our hope is that these requests will decrease the amount of time that you wait before being seen by our physicians.        _____________________________________________________________  Should you have questions after your visit to [facility], please contact our office at [phone] and follow the prompts.  Our office hours are 8:00 a.m. and 4:30 p.m. Monday - Friday.  Please note that voicemails left after 4:00 p.m. may not be returned until the following business day.  We are closed weekends and major holidays.  You do have access to a nurse 24-7, just call the main number to the clinic [phone] and do not press any options, hold on the line and a nurse will answer the phone.    For prescription refill requests, have your pharmacy contact our office and allow 72 hours.    Due to Covid, you will need to wear a mask upon entering the hospital. If you do not have a mask, a mask will be given to you at the Main Entrance upon arrival. For doctor visits, patients may have 1 support person age 18 or older with them. For treatment visits, patients can not have anyone with them due to social distancing guidelines and our immunocompromised population.      

## 2019-12-27 ENCOUNTER — Inpatient Hospital Stay (HOSPITAL_COMMUNITY): Payer: 59

## 2019-12-27 ENCOUNTER — Other Ambulatory Visit: Payer: Self-pay

## 2019-12-27 NOTE — Progress Notes (Signed)

## 2019-12-28 ENCOUNTER — Encounter (HOSPITAL_COMMUNITY): Payer: Self-pay

## 2019-12-28 DIAGNOSIS — D46Z Other myelodysplastic syndromes: Secondary | ICD-10-CM | POA: Insufficient documentation

## 2019-12-28 DIAGNOSIS — Z95828 Presence of other vascular implants and grafts: Secondary | ICD-10-CM

## 2019-12-28 DIAGNOSIS — Z7189 Other specified counseling: Secondary | ICD-10-CM | POA: Insufficient documentation

## 2019-12-28 HISTORY — DX: Presence of other vascular implants and grafts: Z95.828

## 2019-12-28 MED ORDER — LIDOCAINE-PRILOCAINE 2.5-2.5 % EX CREA: TOPICAL_CREAM | CUTANEOUS | 3 refills | Status: DC

## 2019-12-28 MED ORDER — PROCHLORPERAZINE MALEATE 10 MG PO TABS: 10.0000 mg | ORAL_TABLET | Freq: Four times a day (QID) | ORAL | 1 refills | Status: DC | PRN

## 2019-12-28 NOTE — Progress Notes (Signed)
START ON PATHWAY REGIMEN - MDS     A cycle is every 28 days:     Azacitidine   **Always confirm dose/schedule in your pharmacy ordering system**  Patient Characteristics: Higher-Risk (IPSS-R Score > 3.5), First Line, Transplant Candidate WHO Disease Classification: MDS-EB2 Bone Marrow Blasts (percent): > 10% Cytogenetic Category: Unknown Platelets (x 10^9/L): 50 to < 100 Absolute Neutrophil Count (x 10^9/L): Unknown Line of Therapy: First Line IPSS-R Risk Category: Unknown IPSS-R Risk Score: Unknown Check here if patient's risk score was calculated prior to the International Prognostic Scoring System-Revised (IPSS-R): true Hemoglobin (g/dl): < 8 Patient Characteristics: Transplant Candidate Intent of Therapy: Non-Curative / Palliative Intent, Discussed with Patient

## 2019-12-31 ENCOUNTER — Inpatient Hospital Stay (HOSPITAL_BASED_OUTPATIENT_CLINIC_OR_DEPARTMENT_OTHER): Payer: 59 | Admitting: Hematology

## 2019-12-31 ENCOUNTER — Inpatient Hospital Stay (HOSPITAL_COMMUNITY): Payer: 59

## 2019-12-31 ENCOUNTER — Encounter (HOSPITAL_COMMUNITY): Payer: Self-pay | Admitting: Hematology

## 2019-12-31 ENCOUNTER — Other Ambulatory Visit (HOSPITAL_COMMUNITY): Payer: Self-pay

## 2019-12-31 ENCOUNTER — Other Ambulatory Visit: Payer: Self-pay

## 2019-12-31 VITALS — BP 114/53 | HR 83 | Temp 97.3°F | Resp 18

## 2019-12-31 VITALS — BP 130/72 | HR 95 | Temp 98.9°F | Resp 16 | Wt 223.8 lb

## 2019-12-31 DIAGNOSIS — D46Z Other myelodysplastic syndromes: Secondary | ICD-10-CM

## 2019-12-31 DIAGNOSIS — D696 Thrombocytopenia, unspecified: Secondary | ICD-10-CM

## 2019-12-31 DIAGNOSIS — D539 Nutritional anemia, unspecified: Secondary | ICD-10-CM

## 2019-12-31 DIAGNOSIS — D649 Anemia, unspecified: Secondary | ICD-10-CM

## 2019-12-31 DIAGNOSIS — Z95828 Presence of other vascular implants and grafts: Secondary | ICD-10-CM

## 2019-12-31 LAB — CBC WITH DIFFERENTIAL/PLATELET
Abs Immature Granulocytes: 5 10*3/uL — ABNORMAL HIGH (ref 0.00–0.07)
Band Neutrophils: 14 %
Basophils Absolute: 0 10*3/uL (ref 0.0–0.1)
Basophils Relative: 0 %
Eosinophils Absolute: 0 10*3/uL (ref 0.0–0.5)
Eosinophils Relative: 0 %
HCT: 22.2 % — ABNORMAL LOW (ref 39.0–52.0)
Hemoglobin: 7.1 g/dL — ABNORMAL LOW (ref 13.0–17.0)
Lymphocytes Relative: 20 %
Lymphs Abs: 3.7 10*3/uL (ref 0.7–4.0)
MCH: 36.6 pg — ABNORMAL HIGH (ref 26.0–34.0)
MCHC: 32 g/dL (ref 30.0–36.0)
MCV: 114.4 fL — ABNORMAL HIGH (ref 80.0–100.0)
Metamyelocytes Relative: 6 %
Monocytes Absolute: 6.5 10*3/uL — ABNORMAL HIGH (ref 0.1–1.0)
Monocytes Relative: 35 %
Myelocytes: 6 %
Neutro Abs: 5.9 10*3/uL (ref 1.7–7.7)
Neutrophils Relative %: 18 %
Platelets: 37 10*3/uL — ABNORMAL LOW (ref 150–400)
Promyelocytes Relative: 1 %
RBC: 1.94 MIL/uL — ABNORMAL LOW (ref 4.22–5.81)
RDW: 28 % — ABNORMAL HIGH (ref 11.5–15.5)
WBC: 18.5 10*3/uL — ABNORMAL HIGH (ref 4.0–10.5)
nRBC: 0.1 % (ref 0.0–0.2)

## 2019-12-31 LAB — SAMPLE TO BLOOD BANK

## 2019-12-31 LAB — PREPARE RBC (CROSSMATCH)

## 2019-12-31 MED ORDER — AZACITIDINE CHEMO SQ INJECTION: 75.0000 mg/m2 | Freq: Once | INTRAMUSCULAR | Status: DC

## 2019-12-31 MED ORDER — ONDANSETRON HCL 4 MG PO TABS
ORAL_TABLET | ORAL | Status: AC
  Filled 2019-12-31: qty 2

## 2019-12-31 MED ORDER — SODIUM CHLORIDE 0.9 % IV SOLN
10.0000 mg | Freq: Once | INTRAVENOUS | Status: AC
  Administered 2019-12-31: 10 mg via INTRAVENOUS
  Filled 2019-12-31: qty 10

## 2019-12-31 MED ORDER — ONDANSETRON HCL 4 MG PO TABS
8.0000 mg | ORAL_TABLET | Freq: Once | ORAL | Status: AC
  Administered 2019-12-31: 8 mg via ORAL

## 2019-12-31 MED ORDER — LIDOCAINE-PRILOCAINE 2.5-2.5 % EX CREA: TOPICAL_CREAM | CUTANEOUS | 3 refills | Status: DC

## 2019-12-31 MED ORDER — SODIUM CHLORIDE 0.9 % IV SOLN
75.0000 mg/m2 | Freq: Once | INTRAVENOUS | Status: AC
  Administered 2019-12-31: 170 mg via INTRAVENOUS
  Filled 2019-12-31: qty 17

## 2019-12-31 MED ORDER — PROCHLORPERAZINE MALEATE 10 MG PO TABS: 10.0000 mg | ORAL_TABLET | Freq: Four times a day (QID) | ORAL | 1 refills | Status: DC | PRN

## 2019-12-31 MED ORDER — SODIUM CHLORIDE 0.9 % IV SOLN: INTRAVENOUS | Status: DC

## 2019-12-31 NOTE — Patient Instructions (Signed)
[name] at [facility] Discharge Instructions  You were seen today by Dr. Delton Coombes. He went over your recent results. You received your treatment today. Continue getting your weekly labs to monitor the need for transfusions. Dr. Delton Coombes will see you back in 4 weeks for labs and follow up.   Thank you for choosing [place] at [facility] to provide your oncology and hematology care.  To afford each patient quality time with our provider, please arrive at least 15 minutes before your scheduled appointment time.   If you have a lab appointment with the [facility] please come in thru the Main Entrance and check in at the main information desk  You need to re-schedule your appointment should you arrive 10 or more minutes late.  We strive to give you quality time with our providers, and arriving late affects you and other patients whose appointments are after yours.  Also, if you no show three or more times for appointments you may be dismissed from the clinic at the providers discretion.     Again, thank you for choosing [facility].  Our hope is that these requests will decrease the amount of time that you wait before being seen by our physicians.       _____________________________________________________________  Should you have questions after your visit to [facility], please contact our office at [phone] between the hours of 8:00 a.m. and 4:30 p.m.  Voicemails left after 4:00 p.m. will not be returned until the following business day.  For prescription refill requests, have your pharmacy contact our office and allow 72 hours.    Cancer Center Support Programs:   > Cancer Support Group  2nd Tuesday of the month 1pm-2pm, Journey Room

## 2019-12-31 NOTE — Patient Instructions (Signed)
[facility] Discharge Instructions for Patients Receiving Chemotherapy  Today you received the following chemotherapy agents   To help prevent nausea and vomiting after your treatment, we encourage you to take your nausea medication   If you develop nausea and vomiting that is not controlled by your nausea medication, call the clinic.   BELOW ARE SYMPTOMS THAT SHOULD BE REPORTED IMMEDIATELY:  *FEVER GREATER THAN 100.5 F  *CHILLS WITH OR WITHOUT FEVER  NAUSEA AND VOMITING THAT IS NOT CONTROLLED WITH YOUR NAUSEA MEDICATION  *UNUSUAL SHORTNESS OF BREATH  *UNUSUAL BRUISING OR BLEEDING  TENDERNESS IN MOUTH AND THROAT WITH OR WITHOUT PRESENCE OF ULCERS  *URINARY PROBLEMS  *BOWEL PROBLEMS  UNUSUAL RASH Items with * indicate a potential emergency and should be followed up as soon as possible.  Feel free to call the clinic should you have any questions or concerns. The clinic phone number is [phone].  Please show the CHEMO ALERT CARD at check-in to the Emergency Department and triage nurse.   

## 2019-12-31 NOTE — Progress Notes (Signed)
Patient has been assessed by Dr. Delton Coombes and labs from last week reviewed. He is okay to proceed with cycle 1 treatment today.  We will recheck his CBC to assess need for blood transfusion today.  Primary RN and pharmacy aware.

## 2019-12-31 NOTE — Progress Notes (Signed)
H[place], [place] [address]   CLINIC:  Medical Oncology/Hematology  PCP:  Hunter Squibb, MD [address] [place] [address] [phone]   REASON FOR VISIT:  Follow-up for MDS, macrocytic anemia and thrombocytopenia  PRIOR THERAPY: None  NGS Results: Not done  CURRENT THERAPY: Intermittent blood transfusion and azacitidine monthly  BRIEF ONCOLOGIC HISTORY:  Oncology History  MDS (myelodysplastic syndrome), high grade ([place])  12/28/2019 Initial Diagnosis   MDS (myelodysplastic syndrome), high grade ([place])   12/31/2019 -  Chemotherapy   The patient had azaCITIDine (VIDAZA) chemo injection 170 mg, 75 mg/m2, Subcutaneous,  Once, 0 of 4 cycles  for chemotherapy treatment.      CANCER STAGING: Cancer Staging No matching staging information was found for the patient.  INTERVAL HISTORY:  Hunter Smith, a 59 y.o. male, returns for routine follow-up and consideration for first cycle of chemotherapy. Hunter Smith was last seen on 12/26/2019.   Due for initiating cycle #1 of azacitidine today.   Overall, he tells me he has been feeling pretty well. He got his last blood transfusion on 8/10. He endorses having CP when he exerts himself, which is a chronic for him. He also reports getting light-headed if he rises up quickly. His initial port visit is scheduled on 8/26.  Overall, he feels ready for first cycle of chemo today.    REVIEW OF SYSTEMS:  Review of Systems  Constitutional: Positive for appetite change (mildly decreased) and fatigue (mild).  Cardiovascular: Positive for chest pain (occasional w/ tachycardia).  Skin: Positive for rash (left armpit).  All other systems reviewed and are negative.   PAST MEDICAL/SURGICAL HISTORY:  Past Medical History:  Diagnosis Date  . Port-A-Cath in place 12/28/2019   Past Surgical History:  Procedure Laterality Date  . COLONOSCOPY      SOCIAL HISTORY:  Social History    Socioeconomic History  . Marital status: Married    Spouse name: Not on file  . Number of children: Not on file  . Years of education: Not on file  . Highest education level: Not on file  Occupational History  . Occupation: Employed  Tobacco Use  . Smoking status: Former Smoker    Types: Cigarettes    Quit date: 05/03/1988    Years since quitting: 31.6  . Smokeless tobacco: Never Used  Substance and Sexual Activity  . Alcohol use: Yes    Alcohol/week: 1.0 - 2.0 standard drink    Types: 1 - 2 Standard drinks or equivalent per week  . Drug use: Never  . Sexual activity: Yes  Other Topics Concern  . Not on file  Social History Narrative  . Not on file   Social Determinants of Health   Financial Resource Strain:   . Difficulty of Paying Living Expenses: Not on file  Food Insecurity:   . Worried About Charity fundraiser in the Last Year: Not on file  . Ran Out of Food in the Last Year: Not on file  Transportation Needs:   . Lack of Transportation (Medical): Not on file  . Lack of Transportation (Non-Medical): Not on file  Physical Activity:   . Days of Exercise per Week: Not on file  . Minutes of Exercise per Session: Not on file  Stress:   . Feeling of Stress : Not on file  Social Connections:   . Frequency of Communication with Friends and Family: Not on file  . Frequency of  Social Gatherings with Friends and Family: Not on file  . Attends Religious Services: Not on file  . Active Member of Clubs or Organizations: Not on file  . Attends Archivist Meetings: Not on file  . Marital Status: Not on file  Intimate Partner Violence:   . Fear of Current or Ex-Partner: Not on file  . Emotionally Abused: Not on file  . Physically Abused: Not on file  . Sexually Abused: Not on file    FAMILY HISTORY:  Family History  Problem Relation Age of Onset  . Bladder Cancer Father   . Scoliosis Sister     CURRENT MEDICATIONS:  Current Outpatient Medications   Medication Sig Dispense Refill  . azaCITIDine 5 mg/2 mLs in lactated ringers infusion Inject into the vein daily. Days 1-7 every 28 days    . lidocaine-prilocaine (EMLA) cream Apply a small amount to port a cath site and cover with plastic wrap 1 hour prior to chemotherapy appointments 30 g 3  . Multiple Vitamin (MULTIVITAMIN) tablet Take 1 tablet by mouth daily.    . prochlorperazine (COMPAZINE) 10 MG tablet Take 1 tablet (10 mg total) by mouth every 6 (six) hours as needed (Nausea or vomiting). 30 tablet 1   No current facility-administered medications for this visit.    ALLERGIES:  No Known Allergies  PHYSICAL EXAM:  Performance status (ECOG): 1 - Symptomatic but completely ambulatory  Vitals:   12/31/19 0825  BP: 130/72  Pulse: 95  Resp: 16  Temp: 98.9 F (37.2 C)  SpO2: 98%   Wt Readings from Last 3 Encounters:  12/31/19 223 lb 13.6 oz (101.5 kg)  12/26/19 224 lb (101.6 kg)  12/19/19 220 lb 11.2 oz (100.1 kg)   Physical Exam Vitals reviewed.  Constitutional:      Appearance: Normal appearance. He is obese.  Cardiovascular:     Rate and Rhythm: Normal rate and regular rhythm.     Pulses: Normal pulses.     Heart sounds: Normal heart sounds.  Pulmonary:     Effort: Pulmonary effort is normal.     Breath sounds: Normal breath sounds.  Abdominal:     General: Bowel sounds are normal.     Palpations: Abdomen is soft. There is no mass.     Tenderness: There is no abdominal tenderness.  Musculoskeletal:     Right lower leg: No edema.     Left lower leg: No edema.  Skin:    Findings: Rash (petechial rash on posterior L armpit) present.  Neurological:     General: No focal deficit present.     Mental Status: He is alert and oriented to person, place, and time.  Psychiatric:        Mood and Affect: Mood normal.        Behavior: Behavior normal.     LABORATORY DATA:  I have reviewed the labs as listed.  CBC Latest Ref Rng & Units 12/26/2019 12/18/2019 12/12/2019   WBC 4.0 - 10.5 K/uL 15.6(H) 14.5(H) 10.3  Hemoglobin 13.0 - 17.0 g/dL 7.6(L) 7.0(L) 6.0(LL)  Hematocrit 39 - 52 % 23.5(L) 21.2(L) 18.5(L)  Platelets 150 - 400 K/uL 44(L) 51(L) 53(L)   CMP Latest Ref Rng & Units 12/26/2019 12/18/2019  Glucose 70 - 99 mg/dL 124(H) 110(H)  BUN 6 - 20 mg/dL 18 20  Creatinine 0.61 - 1.24 mg/dL 1.06 1.11  Sodium 135 - 145 mmol/L 137 136  Potassium 3.5 - 5.1 mmol/L 4.0 4.4  Chloride 98 - 111 mmol/L  105 106  CO2 22 - 32 mmol/L 25 24  Calcium 8.9 - 10.3 mg/dL 9.0 8.7(L)  Total Protein 6.5 - 8.1 g/dL 6.8 6.9  Total Bilirubin 0.3 - 1.2 mg/dL 0.5 0.7  Alkaline Phos 38 - 126 U/L 42 46  AST 15 - 41 U/L 28 30  ALT 0 - 44 U/L 37 34    DIAGNOSTIC IMAGING:  I have independently reviewed the scans and discussed with the patient. No results found.   ASSESSMENT:  1.  High-grade MDS with excess blasts/CMML: -Presentation with pancytopenia and transfusion dependent anemia. -Bone marrow biopsy on 12/14/2019 at [facility] showed hypercellular marrow (90%) with dyserythropoiesis, dysgranulopoiesis and 16% blasts.  Findings consistent with MDS-EB 2.  FLT3 ITD/TKD negative. -Chromosome analysis 47, XY, +8 [19]/46, XY[1]. -MDS FISH panel pending. -However his last 2 CBC showing increased white count of 14-15 K with peripheral blood monocytosis, also likely CMML. -He was evaluated by Dr. Jerrye Noble at [facility].  Bone marrow biopsy after cycle 2 at [facility] recommended.   PLAN:  1.  High-grade MDS/CMML: -He has occasional chest pain with exertion. -Reviewed his labs.  Hemoglobin is 7.1, white count is 18.7 platelet count 37. -His absolute monocyte count is steadily going up. -We will proceed with his first cycle of azacitidine today.  We will continue monitor his labs weekly to see if he needs any blood transfusion. -For his hemoglobin of 7.1, will arrange for 1 unit of PRBC. -I will see him back in 4 weeks for follow-up. -Plan is to repeat bone marrow  biopsy after cycle 2 prior to cycle 3.  2. Thrombocytopenia: -He had some petechial rash in the left posterior axillary fold. -Platelets are 37 today.  No other active bleeding issues. -We will continue to monitor platelet count weekly.  3.  Macrocytic anemia: -We will give 1 unit of irradiated PRBC today for hemoglobin of 7.1. -Continue to monitor hemoglobin weekly.  Addendum: -Towards the end of his azacitidine infusion, he developed warmth over his chest.  I have reassessed him and his vitals were stable.  He denies any chest pain.  No nausea or vomiting reported. I would recommend watching him for 30 minutes and give him 250 mL of normal saline.  Orders placed this encounter:  Orders Placed This Encounter  Procedures  . CBC with Differential/Platelet  . CBC with Differential/Platelet  . Comprehensive metabolic panel     Derek Jack, MD [place] [phone]   I, Milinda Antis, am acting as a scribe for Dr. Sanda Linger.  I, Derek Jack MD, have reviewed the above documentation for accuracy and completeness, and I agree with the above.

## 2019-12-31 NOTE — Progress Notes (Signed)
Labs reviewed from last week, waiting on CBC today. Proceed with treatment per MD. Do not have to wait on CBC to start treatment per MD.  Hemoglobin  7.1. will give one unit PRBC per MD.  Treatment given per orders. Patient tolerated it well without problems. Vitals stable and discharged home from clinic ambulatory. Follow up as scheduled.

## 2020-01-01 ENCOUNTER — Inpatient Hospital Stay (HOSPITAL_COMMUNITY): Payer: 59

## 2020-01-01 ENCOUNTER — Inpatient Hospital Stay (HOSPITAL_COMMUNITY): Payer: 59 | Admitting: General Practice

## 2020-01-01 VITALS — BP 126/73 | HR 65 | Temp 97.5°F | Resp 18

## 2020-01-01 DIAGNOSIS — D46Z Other myelodysplastic syndromes: Secondary | ICD-10-CM

## 2020-01-01 DIAGNOSIS — D539 Nutritional anemia, unspecified: Secondary | ICD-10-CM | POA: Diagnosis not present

## 2020-01-01 DIAGNOSIS — Z95828 Presence of other vascular implants and grafts: Secondary | ICD-10-CM

## 2020-01-01 DIAGNOSIS — D649 Anemia, unspecified: Secondary | ICD-10-CM

## 2020-01-01 MED ORDER — SODIUM CHLORIDE 0.9 % IV SOLN: Freq: Once | INTRAVENOUS | Status: AC

## 2020-01-01 MED ORDER — SODIUM CHLORIDE 0.9 % IV SOLN
75.0000 mg/m2 | Freq: Once | INTRAVENOUS | Status: AC
  Administered 2020-01-01: 170 mg via INTRAVENOUS
  Filled 2020-01-01: qty 17

## 2020-01-01 MED ORDER — SODIUM CHLORIDE 0.9% IV SOLUTION
250.0000 mL | Freq: Once | INTRAVENOUS | Status: AC
  Administered 2020-01-01: 250 mL via INTRAVENOUS

## 2020-01-01 MED ORDER — HEPARIN SOD (PORK) LOCK FLUSH 100 UNIT/ML IV SOLN: 500.0000 [IU] | Freq: Every day | INTRAVENOUS | Status: DC | PRN

## 2020-01-01 MED ORDER — ACETAMINOPHEN 325 MG PO TABS
ORAL_TABLET | ORAL | Status: AC
  Filled 2020-01-01: qty 2

## 2020-01-01 MED ORDER — SODIUM CHLORIDE 0.9% FLUSH: 10.0000 mL | INTRAVENOUS | Status: DC | PRN

## 2020-01-01 MED ORDER — SODIUM CHLORIDE 0.9% FLUSH: 3.0000 mL | INTRAVENOUS | Status: DC | PRN

## 2020-01-01 MED ORDER — DIPHENHYDRAMINE HCL 25 MG PO CAPS
25.0000 mg | ORAL_CAPSULE | Freq: Once | ORAL | Status: AC
  Administered 2020-01-01: 25 mg via ORAL
  Filled 2020-01-01: qty 1

## 2020-01-01 MED ORDER — DIPHENHYDRAMINE HCL 25 MG PO CAPS
ORAL_CAPSULE | ORAL | Status: AC
  Filled 2020-01-01: qty 1

## 2020-01-01 MED ORDER — ONDANSETRON HCL 4 MG PO TABS
ORAL_TABLET | ORAL | Status: AC
  Filled 2020-01-01: qty 2

## 2020-01-01 MED ORDER — ONDANSETRON HCL 4 MG PO TABS
8.0000 mg | ORAL_TABLET | Freq: Once | ORAL | Status: AC
  Administered 2020-01-01: 8 mg via ORAL

## 2020-01-01 MED ORDER — ACETAMINOPHEN 325 MG PO TABS
650.0000 mg | ORAL_TABLET | Freq: Once | ORAL | Status: AC
  Administered 2020-01-01: 650 mg via ORAL

## 2020-01-01 MED ORDER — SODIUM CHLORIDE 0.9 % IV SOLN
10.0000 mg | Freq: Once | INTRAVENOUS | Status: AC
  Administered 2020-01-01: 10 mg via INTRAVENOUS
  Filled 2020-01-01: qty 10

## 2020-01-01 NOTE — Patient Instructions (Signed)
[facility] Discharge Instructions for Patients Receiving Chemotherapy  Today you received the following chemotherapy agents   To help prevent nausea and vomiting after your treatment, we encourage you to take your nausea medication   If you develop nausea and vomiting that is not controlled by your nausea medication, call the clinic.   BELOW ARE SYMPTOMS THAT SHOULD BE REPORTED IMMEDIATELY:  *FEVER GREATER THAN 100.5 F  *CHILLS WITH OR WITHOUT FEVER  NAUSEA AND VOMITING THAT IS NOT CONTROLLED WITH YOUR NAUSEA MEDICATION  *UNUSUAL SHORTNESS OF BREATH  *UNUSUAL BRUISING OR BLEEDING  TENDERNESS IN MOUTH AND THROAT WITH OR WITHOUT PRESENCE OF ULCERS  *URINARY PROBLEMS  *BOWEL PROBLEMS  UNUSUAL RASH Items with * indicate a potential emergency and should be followed up as soon as possible.  Feel free to call the clinic should you have any questions or concerns. The clinic phone number is [phone].  Please show the CHEMO ALERT CARD at check-in to the Emergency Department and triage nurse.   

## 2020-01-01 NOTE — Progress Notes (Signed)
One unit of blood given today per orders.   Treatment given per orders. Patient tolerated it well without problems. Vitals stable and discharged home from clinic ambulatory. Follow up as scheduled.  

## 2020-01-01 NOTE — Progress Notes (Signed)
[facility] Initial Psychosocial Assessment Clinical Social Work  Clinical Social Work met w patient in infusion to assess psychosocial, emotional, mental health, and spiritual needs of the patient.   Barriers to care/review of distress screen:  - Transportation:  Do you anticipate any problems getting to appointments?  Do you have someone who can help run errands for you if you need it?  Able to drive.  Wife takes to/from appointments. - Help at home:  What is your living situation (alone, family, other)?  If you are physically unable to care for yourself, who would you call on to help you?  Lives w wife who is able to support him as needed.   - Support system:  What does your support system look like?  Who would you call on if you needed some kind of practical help?  What if you needed someone to talk to for emotional support?  Significant support from family, friends, coworkers, [name], academic at work.  Helps to care for two grandchildren in afternoons (ages 81 and 22).   - Finances:  Are you concerned about finances.  Considering returning to work?  If not, applying for disability?  On short term disability, no financial concerns.  Works for [employer].    What is your understanding of where you are with your cancer? Its cause?  Your treatment plan and what happens next?  Newly diagnosed w MDS, high grade.  Will undergo infusion therapy, possible BMT in a year approx.  Was basically asymptomatic except for some SOB and mild fatigue prior to diagnosis. Investigation of issue began after routine physical blood work showed abnormalities.   What are your worries for the future as you begin treatment for cancer? Does not have worries, feels his situation is very hopeful.  He reports no concerning side effects or symptoms at this time, has a very positive attitude.    What are your hopes and priorities during your treatment? What is important to you? What are your goals for your care?  Wants to remain active as  possible, does not like to "sit around at home."  Has increased his physic al activity and this makes him feel better.   CSW Summary:  Patient and family psychosocial functioning including strengths, limitations, and coping skills:  59 year old married male, newly diagnosed w MDS, just beginning initial treatment.  Out of work on short term disability, has good coverage from his employer.  Experiencing mild fatigue, but says he is tolerating treatment well.  Lives w wife, has significant support from family and friends.  Does not worry about his prognosis or the treatments needed - "I take it one day at a time."  Wife tends to worry more and does research disease and treatments.  He is using his engineering/problems solving mindset on his current situation - seeing obstacles as problems to be analyzed and solves vs insurmountable hurdles.  CSW and patient discussed common feeling and emotions when being diagnosed with cancer, and the importance of support during treatment. CSW informed patient of the support team and support services at [facility]. CSW provided contact information and encouraged patient to call with any questions or concerns.  Identifications of barriers to care:  None noted  Availability of community resources:  Provided resources for [place] and mentioned [place] as possible avenues for support/connection.    Clinical Social Worker follow up needed: No.   Edwyna Shell, [place] Social Worker Phone:  [phone] Cell:  [phone]

## 2020-01-02 ENCOUNTER — Other Ambulatory Visit: Payer: Self-pay

## 2020-01-02 ENCOUNTER — Encounter (HOSPITAL_COMMUNITY): Payer: Self-pay

## 2020-01-02 ENCOUNTER — Ambulatory Visit (HOSPITAL_COMMUNITY): Payer: 59

## 2020-01-02 ENCOUNTER — Inpatient Hospital Stay (HOSPITAL_COMMUNITY): Payer: 59

## 2020-01-02 VITALS — BP 108/61 | HR 72 | Temp 97.8°F | Resp 18

## 2020-01-02 DIAGNOSIS — D539 Nutritional anemia, unspecified: Secondary | ICD-10-CM | POA: Diagnosis not present

## 2020-01-02 DIAGNOSIS — Z95828 Presence of other vascular implants and grafts: Secondary | ICD-10-CM

## 2020-01-02 DIAGNOSIS — D46Z Other myelodysplastic syndromes: Secondary | ICD-10-CM

## 2020-01-02 LAB — TYPE AND SCREEN
ABO/RH(D): O POS
Antibody Screen: NEGATIVE
Unit division: 0

## 2020-01-02 LAB — BPAM RBC
Blood Product Expiration Date: 202109282359
ISSUE DATE / TIME: 202108240952
Unit Type and Rh: 5100

## 2020-01-02 MED ORDER — HEPARIN SOD (PORK) LOCK FLUSH 100 UNIT/ML IV SOLN: 500.0000 [IU] | Freq: Once | INTRAVENOUS | Status: DC | PRN

## 2020-01-02 MED ORDER — PALONOSETRON HCL INJECTION 0.25 MG/5ML
0.2500 mg | Freq: Once | INTRAVENOUS | Status: AC
  Administered 2020-01-02: 0.25 mg via INTRAVENOUS
  Filled 2020-01-02: qty 5

## 2020-01-02 MED ORDER — SODIUM CHLORIDE 0.9 % IV SOLN: Freq: Once | INTRAVENOUS | Status: AC

## 2020-01-02 MED ORDER — SODIUM CHLORIDE 0.9 % IV SOLN
10.0000 mg | Freq: Once | INTRAVENOUS | Status: AC
  Administered 2020-01-02: 10 mg via INTRAVENOUS
  Filled 2020-01-02: qty 10

## 2020-01-02 MED ORDER — SODIUM CHLORIDE 0.9 % IV SOLN
75.0000 mg/m2 | Freq: Once | INTRAVENOUS | Status: AC
  Administered 2020-01-02: 170 mg via INTRAVENOUS
  Filled 2020-01-02: qty 17

## 2020-01-02 MED ORDER — SODIUM CHLORIDE 0.9% FLUSH
10.0000 mL | INTRAVENOUS | Status: DC | PRN
  Administered 2020-01-02: 10 mL

## 2020-01-02 NOTE — Progress Notes (Signed)
Here for day 3 treatment.  Vital signs WNL for treatment today.  Hunter Smith tolerated treatment well today without incidence. Vital signs stable prior to discharge.  Discharged ambulatory.

## 2020-01-02 NOTE — Patient Instructions (Signed)
[facility] Discharge Instructions for Patients Receiving Chemotherapy  Today you received the following chemotherapy agents   To help prevent nausea and vomiting after your treatment, we encourage you to take your nausea medication   If you develop nausea and vomiting that is not controlled by your nausea medication, call the clinic.   BELOW ARE SYMPTOMS THAT SHOULD BE REPORTED IMMEDIATELY:  *FEVER GREATER THAN 100.5 F  *CHILLS WITH OR WITHOUT FEVER  NAUSEA AND VOMITING THAT IS NOT CONTROLLED WITH YOUR NAUSEA MEDICATION  *UNUSUAL SHORTNESS OF BREATH  *UNUSUAL BRUISING OR BLEEDING  TENDERNESS IN MOUTH AND THROAT WITH OR WITHOUT PRESENCE OF ULCERS  *URINARY PROBLEMS  *BOWEL PROBLEMS  UNUSUAL RASH Items with * indicate a potential emergency and should be followed up as soon as possible.  Feel free to call the clinic should you have any questions or concerns. The clinic phone number is [phone].  Please show the CHEMO ALERT CARD at check-in to the Emergency Department and triage nurse.   

## 2020-01-03 ENCOUNTER — Ambulatory Visit (INDEPENDENT_AMBULATORY_CARE_PROVIDER_SITE_OTHER): Payer: 59 | Admitting: General Surgery

## 2020-01-03 ENCOUNTER — Inpatient Hospital Stay (HOSPITAL_COMMUNITY): Payer: 59

## 2020-01-03 ENCOUNTER — Encounter (HOSPITAL_COMMUNITY): Payer: Self-pay

## 2020-01-03 ENCOUNTER — Ambulatory Visit (HOSPITAL_COMMUNITY): Payer: Self-pay | Admitting: Hematology

## 2020-01-03 ENCOUNTER — Encounter: Payer: Self-pay | Admitting: General Surgery

## 2020-01-03 ENCOUNTER — Other Ambulatory Visit: Payer: Self-pay

## 2020-01-03 VITALS — BP 118/63 | HR 73 | Temp 97.5°F | Resp 18

## 2020-01-03 VITALS — BP 135/79 | HR 76 | Temp 97.8°F | Resp 12 | Ht 71.0 in | Wt 224.0 lb

## 2020-01-03 DIAGNOSIS — Z95828 Presence of other vascular implants and grafts: Secondary | ICD-10-CM

## 2020-01-03 DIAGNOSIS — D46Z Other myelodysplastic syndromes: Secondary | ICD-10-CM

## 2020-01-03 DIAGNOSIS — D539 Nutritional anemia, unspecified: Secondary | ICD-10-CM | POA: Diagnosis not present

## 2020-01-03 MED ORDER — SODIUM CHLORIDE 0.9 % IV SOLN: Freq: Once | INTRAVENOUS | Status: AC

## 2020-01-03 MED ORDER — SODIUM CHLORIDE 0.9% FLUSH: 10.0000 mL | INTRAVENOUS | Status: DC | PRN

## 2020-01-03 MED ORDER — SODIUM CHLORIDE 0.9 % IV SOLN
75.0000 mg/m2 | Freq: Once | INTRAVENOUS | Status: AC
  Administered 2020-01-03: 170 mg via INTRAVENOUS
  Filled 2020-01-03: qty 17

## 2020-01-03 MED ORDER — SODIUM CHLORIDE 0.9 % IV SOLN
10.0000 mg | Freq: Once | INTRAVENOUS | Status: AC
  Administered 2020-01-03: 10 mg via INTRAVENOUS
  Filled 2020-01-03: qty 10

## 2020-01-03 NOTE — Patient Instructions (Signed)

## 2020-01-03 NOTE — Progress Notes (Signed)
Hunter Smith; [id number]; Dec 15, 1960   HPI Patient is a 59yo wm who is referred to my care by Drs. [place] Crane and [place] for a portacath placement.  Has myelodysplastic disorder and needs central venous access for chemotherapy.  Does have thrombocytopenia. Past Medical History:  Diagnosis Date  . Port-A-Cath in place 12/28/2019    Past Surgical History:  Procedure Laterality Date  . COLONOSCOPY      Family History  Problem Relation Age of Onset  . Bladder Cancer Father   . Scoliosis Sister     Current Outpatient Medications on File Prior to Visit  Medication Sig Dispense Refill  . azaCITIDine 5 mg/2 mLs in lactated ringers infusion Inject into the vein daily. Days 1-7 every 28 days    . lidocaine-prilocaine (EMLA) cream Apply a small amount to port a cath site and cover with plastic wrap 1 hour prior to chemotherapy appointments 30 g 3  . Multiple Vitamin (MULTIVITAMIN) tablet Take 1 tablet by mouth daily.    . prochlorperazine (COMPAZINE) 10 MG tablet Take 1 tablet (10 mg total) by mouth every 6 (six) hours as needed (Nausea or vomiting). 30 tablet 1   Current Facility-Administered Medications on File Prior to Visit  Medication Dose Route Frequency Provider Last Rate Last Admin  . sodium chloride flush (NS) 0.9 % injection 10 mL  10 mL Intracatheter PRN Derek Jack, MD        No Known Allergies  Social History   Substance and Sexual Activity  Alcohol Use Yes  . Alcohol/week: 1.0 - 2.0 standard drink  . Types: 1 - 2 Standard drinks or equivalent per week    Social History   Tobacco Use  Smoking Status Former Smoker  . Types: Cigarettes  . Quit date: 05/03/1988  . Years since quitting: 31.6  Smokeless Tobacco Never Used    Review of Systems  Constitutional: Negative.   HENT: Negative.   Eyes: Negative.   Respiratory: Negative.   Cardiovascular: Negative.   Gastrointestinal: Negative.   Genitourinary: Negative.   Musculoskeletal: Negative.   Skin:  Negative.   Neurological: Negative.   Endo/Heme/Allergies: Bruises/bleeds easily.  Psychiatric/Behavioral: Negative.     Objective   Vitals:   01/03/20 1018  BP: 135/79  Pulse: 76  Resp: 12  Temp: 97.8 F (36.6 C)  SpO2: 95%    Physical Exam Vitals reviewed.  Constitutional:      Appearance: Normal appearance. He is not ill-appearing.  HENT:     Head: Normocephalic and atraumatic.  Cardiovascular:     Rate and Rhythm: Normal rate and regular rhythm.     Heart sounds: Normal heart sounds. No murmur heard.  No friction rub. No gallop.   Pulmonary:     Effort: Pulmonary effort is normal. No respiratory distress.     Breath sounds: Normal breath sounds. No wheezing, rhonchi or rales.  Skin:    General: Skin is warm and dry.  Neurological:     Mental Status: He is alert and oriented to person, place, and time.   Oncology notes reviewed.  Assessment  Myelodysplastic syndrome Plan   Will have to coordinate platelet transfusion with portacath placement.  The risks and benefits of the procedure including bleeding, infection, and pneumothorax were fully explained to the patient, who gives informed consent.

## 2020-01-03 NOTE — Patient Instructions (Signed)
[facility] Discharge Instructions for Patients Receiving Chemotherapy  Today you received the following chemotherapy agents   To help prevent nausea and vomiting after your treatment, we encourage you to take your nausea medication   If you develop nausea and vomiting that is not controlled by your nausea medication, call the clinic.   BELOW ARE SYMPTOMS THAT SHOULD BE REPORTED IMMEDIATELY:  *FEVER GREATER THAN 100.5 F  *CHILLS WITH OR WITHOUT FEVER  NAUSEA AND VOMITING THAT IS NOT CONTROLLED WITH YOUR NAUSEA MEDICATION  *UNUSUAL SHORTNESS OF BREATH  *UNUSUAL BRUISING OR BLEEDING  TENDERNESS IN MOUTH AND THROAT WITH OR WITHOUT PRESENCE OF ULCERS  *URINARY PROBLEMS  *BOWEL PROBLEMS  UNUSUAL RASH Items with * indicate a potential emergency and should be followed up as soon as possible.  Feel free to call the clinic should you have any questions or concerns. The clinic phone number is [phone].  Please show the CHEMO ALERT CARD at check-in to the Emergency Department and triage nurse.   

## 2020-01-03 NOTE — Progress Notes (Signed)
Pt here for day 4 treatment [place].  Vital signs WNL for treatment.    Hunter Smith tolerated treatment well today without incidence.  Discharged ambulatory.  Vital signs WNL prior to discharge.

## 2020-01-04 ENCOUNTER — Encounter (HOSPITAL_COMMUNITY): Payer: Self-pay

## 2020-01-04 ENCOUNTER — Other Ambulatory Visit: Payer: Self-pay

## 2020-01-04 ENCOUNTER — Inpatient Hospital Stay (HOSPITAL_COMMUNITY): Payer: 59

## 2020-01-04 VITALS — BP 111/72 | HR 73 | Temp 97.6°F | Resp 18

## 2020-01-04 DIAGNOSIS — D539 Nutritional anemia, unspecified: Secondary | ICD-10-CM | POA: Diagnosis not present

## 2020-01-04 DIAGNOSIS — Z95828 Presence of other vascular implants and grafts: Secondary | ICD-10-CM

## 2020-01-04 DIAGNOSIS — D46Z Other myelodysplastic syndromes: Secondary | ICD-10-CM

## 2020-01-04 MED ORDER — SODIUM CHLORIDE 0.9 % IV SOLN: Freq: Once | INTRAVENOUS | Status: AC

## 2020-01-04 MED ORDER — PALONOSETRON HCL INJECTION 0.25 MG/5ML
0.2500 mg | Freq: Once | INTRAVENOUS | Status: AC
  Administered 2020-01-04: 0.25 mg via INTRAVENOUS
  Filled 2020-01-04: qty 5

## 2020-01-04 MED ORDER — SODIUM CHLORIDE 0.9 % IV SOLN
10.0000 mg | Freq: Once | INTRAVENOUS | Status: AC
  Administered 2020-01-04: 10 mg via INTRAVENOUS
  Filled 2020-01-04: qty 10

## 2020-01-04 MED ORDER — SODIUM CHLORIDE 0.9 % IV SOLN
75.0000 mg/m2 | Freq: Once | INTRAVENOUS | Status: AC
  Administered 2020-01-04: 170 mg via INTRAVENOUS
  Filled 2020-01-04: qty 17

## 2020-01-04 MED ORDER — SODIUM CHLORIDE 0.9% FLUSH: 10.0000 mL | INTRAVENOUS | Status: DC | PRN

## 2020-01-04 NOTE — Patient Instructions (Signed)
[facility] Discharge Instructions for Patients Receiving Chemotherapy  Today you received the following chemotherapy agents   To help prevent nausea and vomiting after your treatment, we encourage you to take your nausea medication   If you develop nausea and vomiting that is not controlled by your nausea medication, call the clinic.   BELOW ARE SYMPTOMS THAT SHOULD BE REPORTED IMMEDIATELY:  *FEVER GREATER THAN 100.5 F  *CHILLS WITH OR WITHOUT FEVER  NAUSEA AND VOMITING THAT IS NOT CONTROLLED WITH YOUR NAUSEA MEDICATION  *UNUSUAL SHORTNESS OF BREATH  *UNUSUAL BRUISING OR BLEEDING  TENDERNESS IN MOUTH AND THROAT WITH OR WITHOUT PRESENCE OF ULCERS  *URINARY PROBLEMS  *BOWEL PROBLEMS  UNUSUAL RASH Items with * indicate a potential emergency and should be followed up as soon as possible.  Feel free to call the clinic should you have any questions or concerns. The clinic phone number is [phone].  Please show the CHEMO ALERT CARD at check-in to the Emergency Department and triage nurse.   

## 2020-01-04 NOTE — Progress Notes (Signed)
Day 5 treatment today.  Vital signs WNL for treatment. No change from yesterday.  Tolerated treatment well today without incidence.  Vital signs WNL prior to discharge. Discharged ambulatory.

## 2020-01-07 ENCOUNTER — Inpatient Hospital Stay (HOSPITAL_COMMUNITY): Payer: 59

## 2020-01-07 ENCOUNTER — Encounter (HOSPITAL_COMMUNITY): Payer: Self-pay

## 2020-01-07 ENCOUNTER — Ambulatory Visit (HOSPITAL_COMMUNITY): Payer: 59 | Admitting: Hematology

## 2020-01-07 ENCOUNTER — Other Ambulatory Visit (HOSPITAL_COMMUNITY): Payer: Self-pay

## 2020-01-07 ENCOUNTER — Other Ambulatory Visit: Payer: Self-pay

## 2020-01-07 VITALS — BP 114/70 | HR 87 | Temp 98.6°F | Resp 18 | Wt 222.8 lb

## 2020-01-07 DIAGNOSIS — D46Z Other myelodysplastic syndromes: Secondary | ICD-10-CM

## 2020-01-07 DIAGNOSIS — D539 Nutritional anemia, unspecified: Secondary | ICD-10-CM | POA: Diagnosis not present

## 2020-01-07 DIAGNOSIS — D649 Anemia, unspecified: Secondary | ICD-10-CM

## 2020-01-07 DIAGNOSIS — Z95828 Presence of other vascular implants and grafts: Secondary | ICD-10-CM

## 2020-01-07 DIAGNOSIS — D696 Thrombocytopenia, unspecified: Secondary | ICD-10-CM

## 2020-01-07 LAB — CBC WITH DIFFERENTIAL/PLATELET
Abs Immature Granulocytes: 4.46 10*3/uL — ABNORMAL HIGH (ref 0.00–0.07)
Band Neutrophils: 7 %
Basophils Absolute: 0 10*3/uL (ref 0.0–0.1)
Basophils Relative: 0 %
Eosinophils Absolute: 0.3 10*3/uL (ref 0.0–0.5)
Eosinophils Relative: 2 %
HCT: 25 % — ABNORMAL LOW (ref 39.0–52.0)
Hemoglobin: 8.2 g/dL — ABNORMAL LOW (ref 13.0–17.0)
Lymphocytes Relative: 22 %
Lymphs Abs: 3.5 10*3/uL (ref 0.7–4.0)
MCH: 35.2 pg — ABNORMAL HIGH (ref 26.0–34.0)
MCHC: 32.8 g/dL (ref 30.0–36.0)
MCV: 107.3 fL — ABNORMAL HIGH (ref 80.0–100.0)
Metamyelocytes Relative: 11 %
Monocytes Absolute: 6.7 10*3/uL — ABNORMAL HIGH (ref 0.1–1.0)
Monocytes Relative: 42 %
Myelocytes: 3 %
Neutro Abs: 3.2 10*3/uL (ref 1.7–7.7)
Neutrophils Relative %: 13 %
Platelets: 32 10*3/uL — ABNORMAL LOW (ref 150–400)
RBC: 2.33 MIL/uL — ABNORMAL LOW (ref 4.22–5.81)
RDW: 24.9 % — ABNORMAL HIGH (ref 11.5–15.5)
WBC: 15.9 10*3/uL — ABNORMAL HIGH (ref 4.0–10.5)
nRBC: 0 % (ref 0.0–0.2)

## 2020-01-07 LAB — COMPREHENSIVE METABOLIC PANEL
ALT: 30 U/L (ref 0–44)
AST: 20 U/L (ref 15–41)
Albumin: 3.8 g/dL (ref 3.5–5.0)
Alkaline Phosphatase: 38 U/L (ref 38–126)
Anion gap: 7 (ref 5–15)
BUN: 19 mg/dL (ref 6–20)
CO2: 26 mmol/L (ref 22–32)
Calcium: 8.9 mg/dL (ref 8.9–10.3)
Chloride: 103 mmol/L (ref 98–111)
Creatinine, Ser: 1.06 mg/dL (ref 0.61–1.24)
GFR calc Af Amer: >60 mL/min (ref >60)
GFR calc non Af Amer: >60 mL/min (ref >60)
Glucose, Bld: 98 mg/dL (ref 70–99)
Potassium: 4.4 mmol/L (ref 3.5–5.1)
Sodium: 136 mmol/L (ref 135–145)
Total Bilirubin: 0.6 mg/dL (ref 0.3–1.2)
Total Protein: 6.4 g/dL — ABNORMAL LOW (ref 6.5–8.1)

## 2020-01-07 LAB — SAMPLE TO BLOOD BANK

## 2020-01-07 MED ORDER — PALONOSETRON HCL INJECTION 0.25 MG/5ML
INTRAVENOUS | Status: AC
  Filled 2020-01-07: qty 5

## 2020-01-07 MED ORDER — SODIUM CHLORIDE 0.9 % IV SOLN
10.0000 mg | Freq: Once | INTRAVENOUS | Status: AC
  Administered 2020-01-07: 10 mg via INTRAVENOUS
  Filled 2020-01-07: qty 10

## 2020-01-07 MED ORDER — SODIUM CHLORIDE 0.9% FLUSH: 10.0000 mL | INTRAVENOUS | Status: DC | PRN

## 2020-01-07 MED ORDER — SODIUM CHLORIDE 0.9 % IV SOLN
75.0000 mg/m2 | Freq: Once | INTRAVENOUS | Status: AC
  Administered 2020-01-07: 170 mg via INTRAVENOUS
  Filled 2020-01-07: qty 17

## 2020-01-07 MED ORDER — SODIUM CHLORIDE 0.9 % IV SOLN: Freq: Once | INTRAVENOUS | Status: AC

## 2020-01-07 MED ORDER — PALONOSETRON HCL INJECTION 0.25 MG/5ML
0.2500 mg | Freq: Once | INTRAVENOUS | Status: AC
  Administered 2020-01-07: 0.25 mg via INTRAVENOUS

## 2020-01-07 NOTE — Patient Instructions (Signed)
[facility] Discharge Instructions for Patients Receiving Chemotherapy  Today you received the following chemotherapy agents   To help prevent nausea and vomiting after your treatment, we encourage you to take your nausea medication   If you develop nausea and vomiting that is not controlled by your nausea medication, call the clinic.   BELOW ARE SYMPTOMS THAT SHOULD BE REPORTED IMMEDIATELY:  *FEVER GREATER THAN 100.5 F  *CHILLS WITH OR WITHOUT FEVER  NAUSEA AND VOMITING THAT IS NOT CONTROLLED WITH YOUR NAUSEA MEDICATION  *UNUSUAL SHORTNESS OF BREATH  *UNUSUAL BRUISING OR BLEEDING  TENDERNESS IN MOUTH AND THROAT WITH OR WITHOUT PRESENCE OF ULCERS  *URINARY PROBLEMS  *BOWEL PROBLEMS  UNUSUAL RASH Items with * indicate a potential emergency and should be followed up as soon as possible.  Feel free to call the clinic should you have any questions or concerns. The clinic phone number is [phone].  Please show the CHEMO ALERT CARD at check-in to the Emergency Department and triage nurse.   

## 2020-01-07 NOTE — Progress Notes (Signed)
Pt here for [facility] for azacitidine.  Pt did have issues with constipation over the weekend.  He did take ex-lax which gave him diarrhea.  We went over how to use stool softeners/miralax.    Labs verified with Dr Raliegh Ip, Platelets 32,000 and hemoglobin 8.2.  Okay to proceed with treatment.   Tolerated treatment today without incidence. Vital signs stable prior to discharge.  Discharged ambulatory.

## 2020-01-07 NOTE — H&P (Addendum)
Hunter Smith; [id number]; 01-Nov-1960   HPI Patient is a 59yo wm who is referred to my care by Drs. [place] Crane and [place] for a portacath placement.  Has myelodysplastic disorder and needs central venous access for chemotherapy.  Does have thrombocytopenia. Past Medical History:  Diagnosis Date  . Port-A-Cath in place 12/28/2019    Past Surgical History:  Procedure Laterality Date  . COLONOSCOPY      Family History  Problem Relation Age of Onset  . Bladder Cancer Father   . Scoliosis Sister     Current Outpatient Medications on File Prior to Visit  Medication Sig Dispense Refill  . azaCITIDine 5 mg/2 mLs in lactated ringers infusion Inject into the vein daily. Days 1-7 every 28 days    . lidocaine-prilocaine (EMLA) cream Apply a small amount to port a cath site and cover with plastic wrap 1 hour prior to chemotherapy appointments 30 g 3  . Multiple Vitamin (MULTIVITAMIN) tablet Take 1 tablet by mouth daily.    . prochlorperazine (COMPAZINE) 10 MG tablet Take 1 tablet (10 mg total) by mouth every 6 (six) hours as needed (Nausea or vomiting). 30 tablet 1   Current Facility-Administered Medications on File Prior to Visit  Medication Dose Route Frequency Provider Last Rate Last Admin  . sodium chloride flush (NS) 0.9 % injection 10 mL  10 mL Intracatheter PRN Derek Jack, MD        No Known Allergies  Social History   Substance and Sexual Activity  Alcohol Use Yes  . Alcohol/week: 1.0 - 2.0 standard drink  . Types: 1 - 2 Standard drinks or equivalent per week    Social History   Tobacco Use  Smoking Status Former Smoker  . Types: Cigarettes  . Quit date: 05/03/1988  . Years since quitting: 31.6  Smokeless Tobacco Never Used    Review of Systems  Constitutional: Negative.   HENT: Negative.   Eyes: Negative.   Respiratory: Negative.   Cardiovascular: Negative.   Gastrointestinal: Negative.   Genitourinary: Negative.   Musculoskeletal: Negative.   Skin:  Negative.   Neurological: Negative.   Endo/Heme/Allergies: Bruises/bleeds easily.  Psychiatric/Behavioral: Negative.     Objective   Vitals:   01/03/20 1018  BP: 135/79  Pulse: 76  Resp: 12  Temp: 97.8 F (36.6 C)  SpO2: 95%    Physical Exam Vitals reviewed.  Constitutional:      Appearance: Normal appearance. He is not ill-appearing.  HENT:     Head: Normocephalic and atraumatic.  Cardiovascular:     Rate and Rhythm: Normal rate and regular rhythm.     Heart sounds: Normal heart sounds. No murmur heard.  No friction rub. No gallop.   Pulmonary:     Effort: Pulmonary effort is normal. No respiratory distress.     Breath sounds: Normal breath sounds. No wheezing, rhonchi or rales.  Skin:    General: Skin is warm and dry.  Neurological:     Mental Status: He is alert and oriented to person, place, and time.   Oncology notes reviewed.  Assessment  Myelodysplastic syndrome Plan   Will have to coordinate platelet transfusion with portacath placement.  The risks and benefits of the procedure including bleeding, infection, and pneumothorax were fully explained to the patient, who gives informed consent. Addendum:  To receive platelet transfusion in Oncology just before procedure that morning.

## 2020-01-08 ENCOUNTER — Inpatient Hospital Stay (HOSPITAL_COMMUNITY): Payer: 59

## 2020-01-08 VITALS — BP 120/67 | HR 68 | Temp 97.9°F | Resp 16

## 2020-01-08 DIAGNOSIS — D539 Nutritional anemia, unspecified: Secondary | ICD-10-CM | POA: Diagnosis not present

## 2020-01-08 DIAGNOSIS — Z95828 Presence of other vascular implants and grafts: Secondary | ICD-10-CM

## 2020-01-08 DIAGNOSIS — D46Z Other myelodysplastic syndromes: Secondary | ICD-10-CM

## 2020-01-08 MED ORDER — SODIUM CHLORIDE 0.9 % IV SOLN
10.0000 mg | Freq: Once | INTRAVENOUS | Status: AC
  Administered 2020-01-08: 10 mg via INTRAVENOUS
  Filled 2020-01-08: qty 10

## 2020-01-08 MED ORDER — SODIUM CHLORIDE 0.9 % IV SOLN: Freq: Once | INTRAVENOUS | Status: AC

## 2020-01-08 MED ORDER — SODIUM CHLORIDE 0.9 % IV SOLN
75.0000 mg/m2 | Freq: Once | INTRAVENOUS | Status: AC
  Administered 2020-01-08: 170 mg via INTRAVENOUS
  Filled 2020-01-08: qty 17

## 2020-01-08 NOTE — Patient Instructions (Signed)
[facility] Discharge Instructions for Patients Receiving Chemotherapy  Today you received the following chemotherapy agents   To help prevent nausea and vomiting after your treatment, we encourage you to take your nausea medication   If you develop nausea and vomiting that is not controlled by your nausea medication, call the clinic.   BELOW ARE SYMPTOMS THAT SHOULD BE REPORTED IMMEDIATELY:  *FEVER GREATER THAN 100.5 F  *CHILLS WITH OR WITHOUT FEVER  NAUSEA AND VOMITING THAT IS NOT CONTROLLED WITH YOUR NAUSEA MEDICATION  *UNUSUAL SHORTNESS OF BREATH  *UNUSUAL BRUISING OR BLEEDING  TENDERNESS IN MOUTH AND THROAT WITH OR WITHOUT PRESENCE OF ULCERS  *URINARY PROBLEMS  *BOWEL PROBLEMS  UNUSUAL RASH Items with * indicate a potential emergency and should be followed up as soon as possible.  Feel free to call the clinic should you have any questions or concerns. The clinic phone number is [phone].  Please show the CHEMO ALERT CARD at check-in to the Emergency Department and triage nurse.   

## 2020-01-08 NOTE — Progress Notes (Signed)
PT here for day 7 of treatment. PT denies any changes from yesterday. Proceed with treatment today per protocol.

## 2020-01-08 NOTE — Progress Notes (Signed)
Treatment given per orders. Patient tolerated it well without problems. Vitals stable and discharged home from clinic ambulatory. Follow up as scheduled.  

## 2020-01-09 ENCOUNTER — Telehealth (HOSPITAL_COMMUNITY): Payer: Self-pay

## 2020-01-09 NOTE — Telephone Encounter (Signed)
24-follow up call- spoke with patient this morning. Patient stated he felt good today, no issues at this time.

## 2020-01-10 NOTE — Patient Instructions (Signed)
Hunter Smith  01/10/2020     @PREFPERIOPPHARMACY @   Your procedure is scheduled on  01/18/2020.  Report to [facility] at  0830  A.M.  Call this number if you have problems the morning of surgery:  [phone]   Remember:  Do not eat or drink after midnight.                        Take these medicines the morning of surgery with A SIP OF WATER  Compazine(if needed).    Do not wear jewelry, make-up or nail polish.  Do not wear lotions, powders, or perfumes or deodorant. Please brush your teeth.  Do not shave 48 hours prior to surgery.  Men may shave face and neck.  Do not bring valuables to the hospital.  [facility] is not responsible for any belongings or valuables.  Contacts, dentures or bridgework may not be worn into surgery.  Leave your suitcase in the car.  After surgery it may be brought to your room.  For patients admitted to the hospital, discharge time will be determined by your treatment team.  Patients discharged the day of surgery will not be allowed to drive home.   Name and phone number of your driver:   family Special instructions:  DO NOT smoke the morning of your procedure.  Please read over the following fact sheets that you were given. Anesthesia Post-op Instructions and Care and Recovery After Surgery       Implanted Port Insertion, Care After This sheet gives you information about how to care for yourself after your procedure. Your health care provider may also give you more specific instructions. If you have problems or questions, contact your health care provider. What can I expect after the procedure? After the procedure, it is common to have:  Discomfort at the port insertion site.  Bruising on the skin over the port. This should improve over 3-4 days. Follow these instructions at home: [employer] care  After your port is placed, you will get a manufacturer's information card. The card has information about your port. Keep this card  with you at all times.  Take care of the port as told by your health care provider. Ask your health care provider if you or a family member can get training for taking care of the port at home. A home health care nurse may also take care of the port.  Make sure to remember what type of port you have. Incision care      Follow instructions from your health care provider about how to take care of your port insertion site. Make sure you: ? Wash your hands with soap and water before and after you change your bandage (dressing). If soap and water are not available, use hand sanitizer. ? Change your dressing as told by your health care provider. ? Leave stitches (sutures), skin glue, or adhesive strips in place. These skin closures may need to stay in place for 2 weeks or longer. If adhesive strip edges start to loosen and curl up, you may trim the loose edges. Do not remove adhesive strips completely unless your health care provider tells you to do that.  Check your port insertion site every day for signs of infection. Check for: ? Redness, swelling, or pain. ? Fluid or blood. ? Warmth. ? Pus or a bad smell. Activity  Return to your normal activities as  told by your health care provider. Ask your health care provider what activities are safe for you.  Do not lift anything that is heavier than 10 lb (4.5 kg), or the limit that you are told, until your health care provider says that it is safe. General instructions  Take over-the-counter and prescription medicines only as told by your health care provider.  Do not take baths, swim, or use a hot tub until your health care provider approves. Ask your health care provider if you may take showers. You may only be allowed to take sponge baths.  Do not drive for 24 hours if you were given a sedative during your procedure.  Wear a medical alert bracelet in case of an emergency. This will tell any health care providers that you have a port.  Keep  all follow-up visits as told by your health care provider. This is important. Contact a health care provider if:  You cannot flush your port with saline as directed, or you cannot draw blood from the port.  You have a fever or chills.  You have redness, swelling, or pain around your port insertion site.  You have fluid or blood coming from your port insertion site.  Your port insertion site feels warm to the touch.  You have pus or a bad smell coming from the port insertion site. Get help right away if:  You have chest pain or shortness of breath.  You have bleeding from your port that you cannot control. Summary  Take care of the port as told by your health care provider. Keep the manufacturer's information card with you at all times.  Change your dressing as told by your health care provider.  Contact a health care provider if you have a fever or chills or if you have redness, swelling, or pain around your port insertion site.  Keep all follow-up visits as told by your health care provider. This information is not intended to replace advice given to you by your health care provider. Make sure you discuss any questions you have with your health care provider. Document Revised: 11/22/2017 Document Reviewed: 11/22/2017 Elsevier Patient Education  [place] An implanted port is a device that is placed under the skin. It is usually placed in the chest. The device can be used to give IV medicine, to take blood, or for dialysis. You may have an implanted port if:  You need IV medicine that would be irritating to the small veins in your hands or arms.  You need IV medicines, such as antibiotics, for a long period of time.  You need IV nutrition for a long period of time.  You need dialysis. Having a port means that your health care provider will not need to use the veins in your arms for these procedures. You may have fewer limitations when  using a port than you would if you used other types of long-term IVs, and you will likely be able to return to normal activities after your incision heals. An implanted port has two main parts:  Reservoir. The reservoir is the part where a needle is inserted to give medicines or draw blood. The reservoir is round. After it is placed, it appears as a small, raised area under your skin.  Catheter. The catheter is a thin, flexible tube that connects the reservoir to a vein. Medicine that is inserted into the reservoir goes into the catheter and then into the vein. How  is my port accessed? To access your port:  A numbing cream may be placed on the skin over the port site.  Your health care provider will put on a mask and sterile gloves.  The skin over your port will be cleaned carefully with a germ-killing soap and allowed to dry.  Your health care provider will gently pinch the port and insert a needle into it.  Your health care provider will check for a blood return to make sure the port is in the vein and is not clogged.  If your port needs to remain accessed to get medicine continuously (constant infusion), your health care provider will place a clear bandage (dressing) over the needle site. The dressing and needle will need to be changed every week, or as told by your health care provider. What is flushing? Flushing helps keep the port from getting clogged. Follow instructions from your health care provider about how and when to flush the port. Ports are usually flushed with saline solution or a medicine called heparin. The need for flushing will depend on how the port is used:  If the port is only used from time to time to give medicines or draw blood, the port may need to be flushed: ? Before and after medicines have been given. ? Before and after blood has been drawn. ? As part of routine maintenance. Flushing may be recommended every 4-6 weeks.  If a constant infusion is running, the  port may not need to be flushed.  Throw away any syringes in a disposal container that is meant for sharp items (sharps container). You can buy a sharps container from a pharmacy, or you can make one by using an empty hard plastic bottle with a cover. How long will my port stay implanted? The port can stay in for as long as your health care provider thinks it is needed. When it is time for the port to come out, a surgery will be done to remove it. The surgery will be similar to the procedure that was done to put the port in. Follow these instructions at home:   Flush your port as told by your health care provider.  If you need an infusion over several days, follow instructions from your health care provider about how to take care of your port site. Make sure you: ? Wash your hands with soap and water before you change your dressing. If soap and water are not available, use alcohol-based hand sanitizer. ? Change your dressing as told by your health care provider. ? Place any used dressings or infusion bags into a plastic bag. Throw that bag in the trash. ? Keep the dressing that covers the needle clean and dry. Do not get it wet. ? Do not use scissors or sharp objects near the tube. ? Keep the tube clamped, unless it is being used.  Check your port site every day for signs of infection. Check for: ? Redness, swelling, or pain. ? Fluid or blood. ? Pus or a bad smell.  Protect the skin around the port site. ? Avoid wearing bra straps that rub or irritate the site. ? Protect the skin around your port from seat belts. Place a soft pad over your chest if needed.  Bathe or shower as told by your health care provider. The site may get wet as long as you are not actively receiving an infusion.  Return to your normal activities as told by your health care provider. Ask your  health care provider what activities are safe for you.  Carry a medical alert card or wear a medical alert bracelet at all  times. This will let health care providers know that you have an implanted port in case of an emergency. Get help right away if:  You have redness, swelling, or pain at the port site.  You have fluid or blood coming from your port site.  You have pus or a bad smell coming from the port site.  You have a fever. Summary  Implanted ports are usually placed in the chest for long-term IV access.  Follow instructions from your health care provider about flushing the port and changing bandages (dressings).  Take care of the area around your port by avoiding clothing that puts pressure on the area, and by watching for signs of infection.  Protect the skin around your port from seat belts. Place a soft pad over your chest if needed.  Get help right away if you have a fever or you have redness, swelling, pain, drainage, or a bad smell at the port site. This information is not intended to replace advice given to you by your health care provider. Make sure you discuss any questions you have with your health care provider. Document Revised: 08/18/2018 Document Reviewed: 05/29/2016 Elsevier Patient Education  2020 [place] After These instructions provide you with information about caring for yourself after your procedure. Your health care provider may also give you more specific instructions. Your treatment has been planned according to current medical practices, but problems sometimes occur. Call your health care provider if you have any problems or questions after your procedure. What can I expect after the procedure? After your procedure, you may:  Feel sleepy for several hours.  Feel clumsy and have poor balance for several hours.  Feel forgetful about what happened after the procedure.  Have poor judgment for several hours.  Feel nauseous or vomit.  Have a sore throat if you had a breathing tube during the procedure. Follow these instructions at  home: For at least 24 hours after the procedure:      Have a responsible adult stay with you. It is important to have someone help care for you until you are awake and alert.  Rest as needed.  Do not: ? Participate in activities in which you could fall or become injured. ? Drive. ? Use heavy machinery. ? Drink alcohol. ? Take sleeping pills or medicines that cause drowsiness. ? Make important decisions or sign legal documents. ? Take care of children on your own. Eating and drinking  Follow the diet that is recommended by your health care provider.  If you vomit, drink water, juice, or soup when you can drink without vomiting.  Make sure you have little or no nausea before eating solid foods. General instructions  Take over-the-counter and prescription medicines only as told by your health care provider.  If you have sleep apnea, surgery and certain medicines can increase your risk for breathing problems. Follow instructions from your health care provider about wearing your sleep device: ? Anytime you are sleeping, including during daytime naps. ? While taking prescription pain medicines, sleeping medicines, or medicines that make you drowsy.  If you smoke, do not smoke without supervision.  Keep all follow-up visits as told by your health care provider. This is important. Contact a health care provider if:  You keep feeling nauseous or you keep vomiting.  You feel light-headed.  You develop a rash.  You have a fever. Get help right away if:  You have trouble breathing. Summary  For several hours after your procedure, you may feel sleepy and have poor judgment.  Have a responsible adult stay with you for at least 24 hours or until you are awake and alert. This information is not intended to replace advice given to you by your health care provider. Make sure you discuss any questions you have with your health care provider. Document Revised: 07/25/2017 Document  Reviewed: 08/17/2015 Elsevier Patient Education  [place]. How to Use Chlorhexidine for Bathing Chlorhexidine gluconate (CHG) is a germ-killing (antiseptic) solution that is used to clean the skin. It can get rid of the bacteria that normally live on the skin and can keep them away for about 24 hours. To clean your skin with CHG, you may be given:  A CHG solution to use in the shower or as part of a sponge bath.  A prepackaged cloth that contains CHG. Cleaning your skin with CHG may help lower the risk for infection:  While you are staying in the intensive care unit of the hospital.  If you have a vascular access, such as a central line, to provide short-term or long-term access to your veins.  If you have a catheter to drain urine from your bladder.  If you are on a ventilator. A ventilator is a machine that helps you breathe by moving air in and out of your lungs.  After surgery. What are the risks? Risks of using CHG include:  A skin reaction.  Hearing loss, if CHG gets in your ears.  Eye injury, if CHG gets in your eyes and is not rinsed out.  The CHG product catching fire. Make sure that you avoid smoking and flames after applying CHG to your skin. Do not use CHG:  If you have a chlorhexidine allergy or have previously reacted to chlorhexidine.  On babies younger than 92 months of age. How to use CHG solution  Use CHG only as told by your health care provider, and follow the instructions on the label.  Use the full amount of CHG as directed. Usually, this is one bottle. During a shower Follow these steps when using CHG solution during a shower (unless your health care provider gives you different instructions): 1. Start the shower. 2. Use your normal soap and shampoo to wash your face and hair. 3. Turn off the shower or move out of the shower stream. 4. Pour the CHG onto a clean washcloth. Do not use any type of brush or rough-edged sponge. 5. Starting at  your neck, lather your body down to your toes. Make sure you follow these instructions: ? If you will be having surgery, pay special attention to the part of your body where you will be having surgery. Scrub this area for at least 1 minute. ? Do not use CHG on your head or face. If the solution gets into your ears or eyes, rinse them well with water. ? Avoid your genital area. ? Avoid any areas of skin that have broken skin, cuts, or scrapes. ? Scrub your back and under your arms. Make sure to wash skin folds. 6. Let the lather sit on your skin for 1-2 minutes or as long as told by your health care provider. 7. Thoroughly rinse your entire body in the shower. Make sure that all body creases and crevices are rinsed well. 8. Dry off with a clean  towel. Do not put any substances on your body afterward--such as powder, lotion, or perfume--unless you are told to do so by your health care provider. Only use lotions that are recommended by the manufacturer. 9. Put on clean clothes or pajamas. 10. If it is the night before your surgery, sleep in clean sheets.  During a sponge bath Follow these steps when using CHG solution during a sponge bath (unless your health care provider gives you different instructions): 1. Use your normal soap and shampoo to wash your face and hair. 2. Pour the CHG onto a clean washcloth. 3. Starting at your neck, lather your body down to your toes. Make sure you follow these instructions: ? If you will be having surgery, pay special attention to the part of your body where you will be having surgery. Scrub this area for at least 1 minute. ? Do not use CHG on your head or face. If the solution gets into your ears or eyes, rinse them well with water. ? Avoid your genital area. ? Avoid any areas of skin that have broken skin, cuts, or scrapes. ? Scrub your back and under your arms. Make sure to wash skin folds. 4. Let the lather sit on your skin for 1-2 minutes or as long as told  by your health care provider. 5. Using a different clean, wet washcloth, thoroughly rinse your entire body. Make sure that all body creases and crevices are rinsed well. 6. Dry off with a clean towel. Do not put any substances on your body afterward--such as powder, lotion, or perfume--unless you are told to do so by your health care provider. Only use lotions that are recommended by the manufacturer. 7. Put on clean clothes or pajamas. 8. If it is the night before your surgery, sleep in clean sheets. How to use CHG prepackaged cloths  Only use CHG cloths as told by your health care provider, and follow the instructions on the label.  Use the CHG cloth on clean, dry skin.  Do not use the CHG cloth on your head or face unless your health care provider tells you to.  When washing with the CHG cloth: ? Avoid your genital area. ? Avoid any areas of skin that have broken skin, cuts, or scrapes. Before surgery Follow these steps when using a CHG cloth to clean before surgery (unless your health care provider gives you different instructions): 1. Using the CHG cloth, vigorously scrub the part of your body where you will be having surgery. Scrub using a back-and-forth motion for 3 minutes. The area on your body should be completely wet with CHG when you are done scrubbing. 2. Do not rinse. Discard the cloth and let the area air-dry. Do not put any substances on the area afterward, such as powder, lotion, or perfume. 3. Put on clean clothes or pajamas. 4. If it is the night before your surgery, sleep in clean sheets.  For general bathing Follow these steps when using CHG cloths for general bathing (unless your health care provider gives you different instructions). 1. Use a separate CHG cloth for each area of your body. Make sure you wash between any folds of skin and between your fingers and toes. Wash your body in the following order, switching to a new cloth after each step: ? The front of your  neck, shoulders, and chest. ? Both of your arms, under your arms, and your hands. ? Your stomach and groin area, avoiding the genitals. ? Your right  leg and foot. ? Your left leg and foot. ? The back of your neck, your back, and your buttocks. 2. Do not rinse. Discard the cloth and let the area air-dry. Do not put any substances on your body afterward--such as powder, lotion, or perfume--unless you are told to do so by your health care provider. Only use lotions that are recommended by the manufacturer. 3. Put on clean clothes or pajamas. Contact a health care provider if:  Your skin gets irritated after scrubbing.  You have questions about using your solution or cloth. Get help right away if:  Your eyes become very red or swollen.  Your eyes itch badly.  Your skin itches badly and is red or swollen.  Your hearing changes.  You have trouble seeing.  You have swelling or tingling in your mouth or throat.  You have trouble breathing.  You swallow any chlorhexidine. Summary  Chlorhexidine gluconate (CHG) is a germ-killing (antiseptic) solution that is used to clean the skin. Cleaning your skin with CHG may help to lower your risk for infection.  You may be given CHG to use for bathing. It may be in a bottle or in a prepackaged cloth to use on your skin. Carefully follow your health care provider's instructions and the instructions on the product label.  Do not use CHG if you have a chlorhexidine allergy.  Contact your health care provider if your skin gets irritated after scrubbing. This information is not intended to replace advice given to you by your health care provider. Make sure you discuss any questions you have with your health care provider. Document Revised: 07/13/2018 Document Reviewed: 03/24/2017 Elsevier Patient Education  [place].

## 2020-01-11 LAB — FLOW CYTOMETRY

## 2020-01-15 ENCOUNTER — Inpatient Hospital Stay (HOSPITAL_COMMUNITY): Payer: 59 | Attending: Hematology

## 2020-01-15 ENCOUNTER — Other Ambulatory Visit: Payer: Self-pay

## 2020-01-15 DIAGNOSIS — D696 Thrombocytopenia, unspecified: Secondary | ICD-10-CM | POA: Insufficient documentation

## 2020-01-15 DIAGNOSIS — Z5111 Encounter for antineoplastic chemotherapy: Secondary | ICD-10-CM | POA: Insufficient documentation

## 2020-01-15 DIAGNOSIS — Z79899 Other long term (current) drug therapy: Secondary | ICD-10-CM | POA: Insufficient documentation

## 2020-01-15 DIAGNOSIS — D469 Myelodysplastic syndrome, unspecified: Secondary | ICD-10-CM | POA: Insufficient documentation

## 2020-01-15 DIAGNOSIS — D46Z Other myelodysplastic syndromes: Secondary | ICD-10-CM

## 2020-01-15 LAB — CBC WITH DIFFERENTIAL/PLATELET
Band Neutrophils: 2 %
Basophils Absolute: 0 10*3/uL (ref 0.0–0.1)
Basophils Relative: 0 %
Blasts: 7 %
Eosinophils Absolute: 0.2 10*3/uL (ref 0.0–0.5)
Eosinophils Relative: 1 %
HCT: 19.9 % — ABNORMAL LOW (ref 39.0–52.0)
Hemoglobin: 6.3 g/dL — CL (ref 13.0–17.0)
Lymphocytes Relative: 13 %
Lymphs Abs: 2.3 10*3/uL (ref 0.7–4.0)
MCH: 34.6 pg — ABNORMAL HIGH (ref 26.0–34.0)
MCHC: 31.7 g/dL (ref 30.0–36.0)
MCV: 109.3 fL — ABNORMAL HIGH (ref 80.0–100.0)
Monocytes Absolute: 5.2 10*3/uL — ABNORMAL HIGH (ref 0.1–1.0)
Monocytes Relative: 29 %
Myelocytes: 5 %
Neutro Abs: 6.7 10*3/uL (ref 1.7–7.7)
Neutrophils Relative %: 35 %
Platelets: 17 10*3/uL — CL (ref 150–400)
Promyelocytes Relative: 8 %
RBC: 1.82 MIL/uL — ABNORMAL LOW (ref 4.22–5.81)
RDW: 25.4 % — ABNORMAL HIGH (ref 11.5–15.5)
WBC: 18 10*3/uL — ABNORMAL HIGH (ref 4.0–10.5)
nRBC: 0 % (ref 0.0–0.2)

## 2020-01-15 LAB — COMPREHENSIVE METABOLIC PANEL
ALT: 29 U/L (ref 0–44)
AST: 24 U/L (ref 15–41)
Albumin: 3.5 g/dL (ref 3.5–5.0)
Alkaline Phosphatase: 36 U/L — ABNORMAL LOW (ref 38–126)
Anion gap: 9 (ref 5–15)
BUN: 12 mg/dL (ref 6–20)
CO2: 24 mmol/L (ref 22–32)
Calcium: 8.5 mg/dL — ABNORMAL LOW (ref 8.9–10.3)
Chloride: 105 mmol/L (ref 98–111)
Creatinine, Ser: 1.06 mg/dL (ref 0.61–1.24)
GFR calc Af Amer: >60 mL/min (ref >60)
GFR calc non Af Amer: >60 mL/min (ref >60)
Glucose, Bld: 130 mg/dL — ABNORMAL HIGH (ref 70–99)
Potassium: 4.1 mmol/L (ref 3.5–5.1)
Sodium: 138 mmol/L (ref 135–145)
Total Bilirubin: 1.1 mg/dL (ref 0.3–1.2)
Total Protein: 6.3 g/dL — ABNORMAL LOW (ref 6.5–8.1)

## 2020-01-15 LAB — PREPARE RBC (CROSSMATCH)

## 2020-01-15 NOTE — Progress Notes (Signed)
CRITICAL VALUE ALERT  Critical Value:  hgb 6.3; platelets 17  Date & Time Notied:  01/15/2020 at [place]  Provider Notified: Dr. Delton Coombes  Orders Received/Actions taken: transfuse 2 units PRBC and 1 unit platelets

## 2020-01-16 ENCOUNTER — Inpatient Hospital Stay (HOSPITAL_COMMUNITY): Payer: 59

## 2020-01-16 ENCOUNTER — Other Ambulatory Visit (HOSPITAL_COMMUNITY): Payer: 59

## 2020-01-16 ENCOUNTER — Encounter (HOSPITAL_COMMUNITY)
Admission: RE | Admit: 2020-01-16 | Discharge: 2020-01-16 | Disposition: A | Discharge status: Home / Self Care | Payer: 59 | Source: Ambulatory Visit | Attending: General Surgery | Admitting: General Surgery

## 2020-01-16 ENCOUNTER — Other Ambulatory Visit (HOSPITAL_COMMUNITY)
Admission: RE | Admit: 2020-01-16 | Discharge: 2020-01-16 | Disposition: A | Discharge status: Home / Self Care | Payer: 59 | Source: Ambulatory Visit | Attending: General Surgery | Admitting: General Surgery

## 2020-01-16 ENCOUNTER — Other Ambulatory Visit: Payer: Self-pay

## 2020-01-16 VITALS — BP 115/71 | HR 67 | Temp 96.9°F | Resp 18

## 2020-01-16 DIAGNOSIS — Z01812 Encounter for preprocedural laboratory examination: Secondary | ICD-10-CM | POA: Diagnosis present

## 2020-01-16 DIAGNOSIS — Z20822 Contact with and (suspected) exposure to covid-19: Secondary | ICD-10-CM | POA: Diagnosis not present

## 2020-01-16 DIAGNOSIS — D649 Anemia, unspecified: Secondary | ICD-10-CM

## 2020-01-16 DIAGNOSIS — D469 Myelodysplastic syndrome, unspecified: Secondary | ICD-10-CM | POA: Diagnosis not present

## 2020-01-16 DIAGNOSIS — D46Z Other myelodysplastic syndromes: Secondary | ICD-10-CM

## 2020-01-16 DIAGNOSIS — D696 Thrombocytopenia, unspecified: Secondary | ICD-10-CM

## 2020-01-16 LAB — SARS CORONAVIRUS 2 (TAT 6-24 HRS): SARS Coronavirus 2: NEGATIVE

## 2020-01-16 MED ORDER — SODIUM CHLORIDE 0.9% FLUSH: 10.0000 mL | INTRAVENOUS | Status: DC | PRN

## 2020-01-16 MED ORDER — DIPHENHYDRAMINE HCL 25 MG PO CAPS
25.0000 mg | ORAL_CAPSULE | Freq: Once | ORAL | Status: AC
  Administered 2020-01-16: 25 mg via ORAL

## 2020-01-16 MED ORDER — SODIUM CHLORIDE 0.9% IV SOLUTION
250.0000 mL | Freq: Once | INTRAVENOUS | Status: AC
  Administered 2020-01-16: 250 mL via INTRAVENOUS

## 2020-01-16 MED ORDER — DIPHENHYDRAMINE HCL 25 MG PO CAPS
ORAL_CAPSULE | ORAL | Status: AC
  Filled 2020-01-16: qty 1

## 2020-01-16 NOTE — Patient Instructions (Signed)
[facility]  Discharge Instructions:   _______________________________________________________________  Thank you for choosing [facility] at [facility] to provide your oncology and hematology care.  To afford each patient quality time with our providers, please arrive at least 15 minutes before your scheduled appointment.  You need to re-schedule your appointment if you arrive 10 or more minutes late.  We strive to give you quality time with our providers, and arriving late affects you and other patients whose appointments are after yours.  Also, if you no show three or more times for appointments you may be dismissed from the clinic.  Again, thank you for choosing [facility]. Our hope is that these requests will allow you access to exceptional care and in a timely manner. _______________________________________________________________  If you have questions after your visit, please contact our office at [phone] between the hours of 8:30 a.m. and 5:00 p.m. Voicemails left after 4:30 p.m. will not be returned until the following business day. _______________________________________________________________  For prescription refill requests, have your pharmacy contact our office. _______________________________________________________________  Recommendations made by the consultant and any test results will be sent to your referring physician. _______________________________________________________________ 

## 2020-01-16 NOTE — Progress Notes (Signed)
Tolerated 2 units PRBCs and 1 unit of platelets.  Vital signs stable prior to discharge.  Discharged ambulatory.

## 2020-01-17 LAB — PREPARE PLATELET PHERESIS: Unit division: 0

## 2020-01-17 LAB — TYPE AND SCREEN
ABO/RH(D): O POS
Antibody Screen: NEGATIVE
Unit division: 0
Unit division: 0

## 2020-01-17 LAB — BPAM RBC
Blood Product Expiration Date: 202110042359
Blood Product Expiration Date: 202110042359
ISSUE DATE / TIME: 202109080854
ISSUE DATE / TIME: 202109081018
Unit Type and Rh: 5100
Unit Type and Rh: 5100

## 2020-01-17 LAB — BPAM PLATELET PHERESIS
Blood Product Expiration Date: 202109102359
ISSUE DATE / TIME: 202109081212
Unit Type and Rh: 6200

## 2020-01-18 ENCOUNTER — Ambulatory Visit (HOSPITAL_COMMUNITY): Payer: 59 | Admitting: Certified Registered"

## 2020-01-18 ENCOUNTER — Encounter (HOSPITAL_COMMUNITY)
Admission: RE | Disposition: A | Discharge status: Home / Self Care | Payer: Self-pay | Source: Home / Self Care | Attending: General Surgery

## 2020-01-18 ENCOUNTER — Inpatient Hospital Stay (HOSPITAL_COMMUNITY): Payer: 59 | Attending: Hematology

## 2020-01-18 ENCOUNTER — Ambulatory Visit (HOSPITAL_COMMUNITY): Payer: 59

## 2020-01-18 ENCOUNTER — Encounter (HOSPITAL_COMMUNITY): Payer: Self-pay | Admitting: General Surgery

## 2020-01-18 ENCOUNTER — Other Ambulatory Visit: Payer: Self-pay

## 2020-01-18 ENCOUNTER — Ambulatory Visit (HOSPITAL_COMMUNITY)
Admission: RE | Admit: 2020-01-18 | Discharge: 2020-01-18 | Disposition: A | Discharge status: Home / Self Care | Payer: 59 | Attending: General Surgery | Admitting: General Surgery

## 2020-01-18 ENCOUNTER — Encounter (HOSPITAL_COMMUNITY): Payer: Self-pay

## 2020-01-18 VITALS — BP 126/61 | HR 81 | Temp 97.5°F | Resp 18

## 2020-01-18 DIAGNOSIS — Z8052 Family history of malignant neoplasm of bladder: Secondary | ICD-10-CM | POA: Insufficient documentation

## 2020-01-18 DIAGNOSIS — Z87891 Personal history of nicotine dependence: Secondary | ICD-10-CM | POA: Insufficient documentation

## 2020-01-18 DIAGNOSIS — D469 Myelodysplastic syndrome, unspecified: Secondary | ICD-10-CM | POA: Insufficient documentation

## 2020-01-18 DIAGNOSIS — D539 Nutritional anemia, unspecified: Secondary | ICD-10-CM | POA: Insufficient documentation

## 2020-01-18 DIAGNOSIS — Z79899 Other long term (current) drug therapy: Secondary | ICD-10-CM | POA: Diagnosis not present

## 2020-01-18 DIAGNOSIS — D696 Thrombocytopenia, unspecified: Secondary | ICD-10-CM | POA: Insufficient documentation

## 2020-01-18 DIAGNOSIS — Z8269 Family history of other diseases of the musculoskeletal system and connective tissue: Secondary | ICD-10-CM | POA: Insufficient documentation

## 2020-01-18 DIAGNOSIS — R630 Anorexia: Secondary | ICD-10-CM | POA: Insufficient documentation

## 2020-01-18 DIAGNOSIS — Z8049 Family history of malignant neoplasm of other genital organs: Secondary | ICD-10-CM | POA: Insufficient documentation

## 2020-01-18 DIAGNOSIS — R079 Chest pain, unspecified: Secondary | ICD-10-CM | POA: Diagnosis not present

## 2020-01-18 DIAGNOSIS — D46Z Other myelodysplastic syndromes: Secondary | ICD-10-CM

## 2020-01-18 DIAGNOSIS — Z95828 Presence of other vascular implants and grafts: Secondary | ICD-10-CM

## 2020-01-18 DIAGNOSIS — R21 Rash and other nonspecific skin eruption: Secondary | ICD-10-CM | POA: Diagnosis not present

## 2020-01-18 HISTORY — PX: PORTACATH PLACEMENT: SHX2246

## 2020-01-18 SURGERY — INSERTION, TUNNELED CENTRAL VENOUS DEVICE, WITH PORT
Anesthesia: General | Site: Chest | Laterality: Right

## 2020-01-18 MED ORDER — MIDAZOLAM HCL 5 MG/5ML IJ SOLN
INTRAMUSCULAR | Status: DC | PRN
  Administered 2020-01-18: 2 mg via INTRAVENOUS

## 2020-01-18 MED ORDER — LACTATED RINGERS IV SOLN: INTRAVENOUS | Status: DC

## 2020-01-18 MED ORDER — CHLORHEXIDINE GLUCONATE CLOTH 2 % EX PADS: 6.0000 | MEDICATED_PAD | Freq: Once | CUTANEOUS | Status: DC

## 2020-01-18 MED ORDER — PROPOFOL 500 MG/50ML IV EMUL
INTRAVENOUS | Status: DC | PRN
  Administered 2020-01-18: 125 ug/kg/min via INTRAVENOUS

## 2020-01-18 MED ORDER — CEFAZOLIN SODIUM-DEXTROSE 2-4 GM/100ML-% IV SOLN
2.0000 g | INTRAVENOUS | Status: AC
  Administered 2020-01-18: 2 g via INTRAVENOUS

## 2020-01-18 MED ORDER — LIDOCAINE HCL (PF) 1 % IJ SOLN
INTRAMUSCULAR | Status: AC
  Filled 2020-01-18: qty 30

## 2020-01-18 MED ORDER — PROPOFOL 10 MG/ML IV BOLUS
INTRAVENOUS | Status: AC
  Filled 2020-01-18: qty 20

## 2020-01-18 MED ORDER — FENTANYL CITRATE (PF) 100 MCG/2ML IJ SOLN: 25.0000 ug | INTRAMUSCULAR | Status: DC | PRN

## 2020-01-18 MED ORDER — DIPHENHYDRAMINE HCL 25 MG PO CAPS
25.0000 mg | ORAL_CAPSULE | Freq: Once | ORAL | Status: AC
  Administered 2020-01-18: 25 mg via ORAL

## 2020-01-18 MED ORDER — SODIUM CHLORIDE (PF) 0.9 % IJ SOLN
INTRAMUSCULAR | Status: DC | PRN
  Administered 2020-01-18: 10 mL via INTRAVENOUS

## 2020-01-18 MED ORDER — HEPARIN SOD (PORK) LOCK FLUSH 100 UNIT/ML IV SOLN
INTRAVENOUS | Status: DC | PRN
  Administered 2020-01-18: 500 [IU] via INTRAVENOUS

## 2020-01-18 MED ORDER — SODIUM CHLORIDE 0.9% IV SOLUTION
250.0000 mL | Freq: Once | INTRAVENOUS | Status: AC
  Administered 2020-01-18: 250 mL via INTRAVENOUS

## 2020-01-18 MED ORDER — HYDROCODONE-ACETAMINOPHEN 5-325 MG PO TABS
1.0000 | ORAL_TABLET | ORAL | 0 refills | Status: DC | PRN
Start: 2020-01-18 — End: 2020-03-03

## 2020-01-18 MED ORDER — LACTATED RINGERS IV SOLN: INTRAVENOUS | Status: DC | PRN

## 2020-01-18 MED ORDER — CHLORHEXIDINE GLUCONATE 0.12 % MT SOLN: 15.0000 mL | Freq: Once | OROMUCOSAL | Status: DC

## 2020-01-18 MED ORDER — DIPHENHYDRAMINE HCL 25 MG PO CAPS
ORAL_CAPSULE | ORAL | Status: AC
  Filled 2020-01-18: qty 1

## 2020-01-18 MED ORDER — FENTANYL CITRATE (PF) 100 MCG/2ML IJ SOLN
INTRAMUSCULAR | Status: AC
  Filled 2020-01-18: qty 2

## 2020-01-18 MED ORDER — ONDANSETRON HCL 4 MG/2ML IJ SOLN: 4.0000 mg | Freq: Once | INTRAMUSCULAR | Status: DC | PRN

## 2020-01-18 MED ORDER — ACETAMINOPHEN 325 MG PO TABS
650.0000 mg | ORAL_TABLET | Freq: Once | ORAL | Status: AC
  Administered 2020-01-18: 650 mg via ORAL

## 2020-01-18 MED ORDER — ACETAMINOPHEN 325 MG PO TABS
ORAL_TABLET | ORAL | Status: AC
  Filled 2020-01-18: qty 2

## 2020-01-18 MED ORDER — CEFAZOLIN SODIUM-DEXTROSE 2-4 GM/100ML-% IV SOLN
INTRAVENOUS | Status: AC
  Filled 2020-01-18: qty 100

## 2020-01-18 MED ORDER — MIDAZOLAM HCL 2 MG/2ML IJ SOLN
INTRAMUSCULAR | Status: AC
  Filled 2020-01-18: qty 2

## 2020-01-18 MED ORDER — ORAL CARE MOUTH RINSE: 15.0000 mL | Freq: Once | OROMUCOSAL | Status: DC

## 2020-01-18 MED ORDER — FENTANYL CITRATE (PF) 100 MCG/2ML IJ SOLN
INTRAMUSCULAR | Status: DC | PRN
  Administered 2020-01-18 (×2): 25 ug via INTRAVENOUS

## 2020-01-18 MED ORDER — HEPARIN SOD (PORK) LOCK FLUSH 100 UNIT/ML IV SOLN
INTRAVENOUS | Status: AC
  Filled 2020-01-18: qty 5

## 2020-01-18 MED ORDER — LIDOCAINE HCL (PF) 1 % IJ SOLN
INTRAMUSCULAR | Status: DC | PRN
  Administered 2020-01-18: 6 mL

## 2020-01-18 SURGICAL SUPPLY — 32 items
ADH SKN CLS APL DERMABOND .7 (GAUZE/BANDAGES/DRESSINGS) ×1
APL PRP STRL LF ISPRP CHG 10.5 (MISCELLANEOUS) ×1
APPLICATOR CHLORAPREP 10.5 ORG (MISCELLANEOUS) ×2 IMPLANT
BAG DECANTER FOR FLEXI CONT (MISCELLANEOUS) ×2 IMPLANT
CLOTH BEACON ORANGE TIMEOUT ST (SAFETY) ×2 IMPLANT
COVER LIGHT HANDLE STERIS (MISCELLANEOUS) ×4 IMPLANT
COVER WAND RF STERILE (DRAPES) ×2 IMPLANT
DECANTER SPIKE VIAL GLASS SM (MISCELLANEOUS) ×2 IMPLANT
DERMABOND ADVANCED (GAUZE/BANDAGES/DRESSINGS) ×1
DERMABOND ADVANCED .7 DNX12 (GAUZE/BANDAGES/DRESSINGS) ×1 IMPLANT
DRAPE C-ARM FOLDED MOBILE STRL (DRAPES) ×2 IMPLANT
ELECT REM PT RETURN 9FT ADLT (ELECTROSURGICAL) ×2
ELECTRODE REM PT RTRN 9FT ADLT (ELECTROSURGICAL) ×1 IMPLANT
GLOVE BIOGEL PI IND STRL 7.0 (GLOVE) ×2 IMPLANT
GLOVE BIOGEL PI INDICATOR 7.0 (GLOVE) ×2
GLOVE ECLIPSE 6.5 STRL STRAW (GLOVE) ×1 IMPLANT
GLOVE SURG SS PI 7.5 STRL IVOR (GLOVE) ×2 IMPLANT
GOWN STRL REUS W/TWL LRG LVL3 (GOWN DISPOSABLE) ×4 IMPLANT
IV NS 500ML (IV SOLUTION) ×2
IV NS 500ML BAXH (IV SOLUTION) ×1 IMPLANT
KIT PORT POWER 8FR ISP MRI (Port) ×2 IMPLANT
KIT TURNOVER KIT A (KITS) ×2 IMPLANT
NDL HYPO 25X1 1.5 SAFETY (NEEDLE) ×1 IMPLANT
NEEDLE HYPO 25X1 1.5 SAFETY (NEEDLE) ×2 IMPLANT
PACK MINOR (CUSTOM PROCEDURE TRAY) ×2 IMPLANT
PAD ARMBOARD 7.5X6 YLW CONV (MISCELLANEOUS) ×2 IMPLANT
SET BASIN LINEN APH (SET/KITS/TRAYS/PACK) ×2 IMPLANT
SUT MNCRL AB 4-0 PS2 18 (SUTURE) ×2 IMPLANT
SUT VIC AB 3-0 SH 27 (SUTURE) ×2
SUT VIC AB 3-0 SH 27X BRD (SUTURE) ×1 IMPLANT
SYR 5ML LL (SYRINGE) ×2 IMPLANT
SYR CONTROL 10ML LL (SYRINGE) ×2 IMPLANT

## 2020-01-18 NOTE — Op Note (Signed)
Patient:  Hunter Smith  DOB:  [birth year]  MRN:  [id number]   Preop Diagnosis: Myelodysplastic syndrome  Postop Diagnosis: Same  Procedure: Port-A-Cath insertion  Surgeon: Aviva Signs, MD  Anes: MAC  Indications: Patient is a 59 year old white male who presents for Port-A-Cath placement.  He has myelodysplastic syndrome and needs central venous access.  The risks and benefits of the procedure including bleeding, infection, and pneumothorax were fully explained to the patient, who gave informed consent.  Procedure note: Prior to the procedure, the patient received a platelet transfusion due to thrombocytopenia.  This was done in the oncology.  The patient was placed in Trendelenburg position after the right upper chest was prepped and draped using usual sterile technique with ChloraPrep.  Surgical site confirmation was performed.  1% Xylocaine was used for local anesthesia.  Incision was made below the left clavicle.  A subcutaneous pocket was formed.  A needle was advanced into the right subclavian vein using the Seldinger technique without difficulty.  A guidewire was then advanced into the right atrium under fluoroscopic guidance.  An introducer and peel-away sheath were placed over the guidewire.  The catheter was inserted through the peel-away sheath and the peel-away sheath was removed.  The catheter was then attached to the port and the port placed in subcutaneous pocket.  Adequate positioning was confirmed by fluoroscopy.  Good backflow of venous blood was noted on aspiration of the port.  The port was flushed with heparin flush.  Subcutaneous layer was reapproximated using a 3-0 Vicryl interrupted suture.  The skin was closed using a 4-0 Monocryl subcuticular suture.  Dermabond was applied.  All tape and needle counts were correct at the end of the procedure.  Patient was awakened and transferred to PACU in stable condition.  A chest x-ray will be performed at that time.  Complications:  None  EBL: Minimal  Specimen: None

## 2020-01-18 NOTE — Progress Notes (Signed)
Pt tolerated 1 unit of Platelets today well with no s/s of distress or reaction. PT transferred to Short Stay and report given to nurses in short stay and pt now in pre-op room 4. PT's wife Hunter Smith) sat in the waiting room and her contact information given to OR staff. PT to follow back up with cancer center as needed and scheduled.

## 2020-01-18 NOTE — Anesthesia Postprocedure Evaluation (Signed)
Anesthesia Post Note  Patient: Hunter Smith  Procedure(s) Performed: INSERTION PORT-A-CATH (Right Chest)  Patient location during evaluation: PACU Anesthesia Type: General Level of consciousness: awake and alert, oriented and patient cooperative Pain management: pain level controlled Respiratory status: spontaneous breathing and respiratory function stable Cardiovascular status: blood pressure returned to baseline and stable Postop Assessment: no apparent nausea or vomiting and adequate PO intake Anesthetic complications: no   No complications documented.   Last Vitals:  Vitals:   01/18/20 0956  BP: 138/75  Pulse: 86  Resp: 18  Temp: 36.9 C  SpO2: 97%    Last Pain:  Vitals:   01/18/20 0956  PainSc: 0-No pain                 Noel Henandez

## 2020-01-18 NOTE — Discharge Instructions (Signed)
Implanted Port Home Guide An implanted port is a device that is placed under the skin. It is usually placed in the chest. The device can be used to give IV medicine, to take blood, or for dialysis. You may have an implanted port if:  You need IV medicine that would be irritating to the small veins in your hands or arms.  You need IV medicines, such as antibiotics, for a long period of time.  You need IV nutrition for a long period of time.  You need dialysis. Having a port means that your health care provider will not need to use the veins in your arms for these procedures. You may have fewer limitations when using a port than you would if you used other types of long-term IVs, and you will likely be able to return to normal activities after your incision heals. An implanted port has two main parts:  Reservoir. The reservoir is the part where a needle is inserted to give medicines or draw blood. The reservoir is round. After it is placed, it appears as a small, raised area under your skin.  Catheter. The catheter is a thin, flexible tube that connects the reservoir to a vein. Medicine that is inserted into the reservoir goes into the catheter and then into the vein. How is my port accessed? To access your port:  A numbing cream may be placed on the skin over the port site.  Your health care provider will put on a mask and sterile gloves.  The skin over your port will be cleaned carefully with a germ-killing soap and allowed to dry.  Your health care provider will gently pinch the port and insert a needle into it.  Your health care provider will check for a blood return to make sure the port is in the vein and is not clogged.  If your port needs to remain accessed to get medicine continuously (constant infusion), your health care provider will place a clear bandage (dressing) over the needle site. The dressing and needle will need to be changed every week, or as told by your health care  provider. What is flushing? Flushing helps keep the port from getting clogged. Follow instructions from your health care provider about how and when to flush the port. Ports are usually flushed with saline solution or a medicine called heparin. The need for flushing will depend on how the port is used:  If the port is only used from time to time to give medicines or draw blood, the port may need to be flushed: ? Before and after medicines have been given. ? Before and after blood has been drawn. ? As part of routine maintenance. Flushing may be recommended every 4-6 weeks.  If a constant infusion is running, the port may not need to be flushed.  Throw away any syringes in a disposal container that is meant for sharp items (sharps container). You can buy a sharps container from a pharmacy, or you can make one by using an empty hard plastic bottle with a cover. How long will my port stay implanted? The port can stay in for as long as your health care provider thinks it is needed. When it is time for the port to come out, a surgery will be done to remove it. The surgery will be similar to the procedure that was done to put the port in. Follow these instructions at home:   Flush your port as told by your health care provider.    If you need an infusion over several days, follow instructions from your health care provider about how to take care of your port site. Make sure you: ? Wash your hands with soap and water before you change your dressing. If soap and water are not available, use alcohol-based hand sanitizer. ? Change your dressing as told by your health care provider. ? Place any used dressings or infusion bags into a plastic bag. Throw that bag in the trash. ? Keep the dressing that covers the needle clean and dry. Do not get it wet. ? Do not use scissors or sharp objects near the tube. ? Keep the tube clamped, unless it is being used.  Check your port site every day for signs of  infection. Check for: ? Redness, swelling, or pain. ? Fluid or blood. ? Pus or a bad smell.  Protect the skin around the port site. ? Avoid wearing bra straps that rub or irritate the site. ? Protect the skin around your port from seat belts. Place a soft pad over your chest if needed.  Bathe or shower as told by your health care provider. The site may get wet as long as you are not actively receiving an infusion.  Return to your normal activities as told by your health care provider. Ask your health care provider what activities are safe for you.  Carry a medical alert card or wear a medical alert bracelet at all times. This will let health care providers know that you have an implanted port in case of an emergency. Get help right away if:  You have redness, swelling, or pain at the port site.  You have fluid or blood coming from your port site.  You have pus or a bad smell coming from the port site.  You have a fever. Summary  Implanted ports are usually placed in the chest for long-term IV access.  Follow instructions from your health care provider about flushing the port and changing bandages (dressings).  Take care of the area around your port by avoiding clothing that puts pressure on the area, and by watching for signs of infection.  Protect the skin around your port from seat belts. Place a soft pad over your chest if needed.  Get help right away if you have a fever or you have redness, swelling, pain, drainage, or a bad smell at the port site. This information is not intended to replace advice given to you by your health care provider. Make sure you discuss any questions you have with your health care provider. Document Revised: 08/18/2018 Document Reviewed: 05/29/2016 Elsevier Patient Education  2020 Elsevier Inc[employer]     General Anesthesia, Adult, Care After This sheet gives you information about how to care for yourself after your procedure. Your health care  provider may also give you more specific instructions. If you have problems or questions, contact your health care provider. What can I expect after the procedure? After the procedure, the following side effects are common:  Pain or discomfort at the IV site.  Nausea.  Vomiting.  Sore throat.  Trouble concentrating.  Feeling cold or chills.  Weak or tired.  Sleepiness and fatigue.  Soreness and body aches. These side effects can affect parts of the body that were not involved in surgery. Follow these instructions at home:  For at least 24 hours after the procedure:  Have a responsible adult stay with you. It is important to have someone help care for you until you   are awake and alert.  Rest as needed.  Do not: ? Participate in activities in which you could fall or become injured. ? Drive. ? Use heavy machinery. ? Drink alcohol. ? Take sleeping pills or medicines that cause drowsiness. ? Make important decisions or sign legal documents. ? Take care of children on your own. Eating and drinking  Follow any instructions from your health care provider about eating or drinking restrictions.  When you feel hungry, start by eating small amounts of foods that are soft and easy to digest (bland), such as toast. Gradually return to your regular diet.  Drink enough fluid to keep your urine pale yellow.  If you vomit, rehydrate by drinking water, juice, or clear broth. General instructions  If you have sleep apnea, surgery and certain medicines can increase your risk for breathing problems. Follow instructions from your health care provider about wearing your sleep device: ? Anytime you are sleeping, including during daytime naps. ? While taking prescription pain medicines, sleeping medicines, or medicines that make you drowsy.  Return to your normal activities as told by your health care provider. Ask your health care provider what activities are safe for you.  Take  over-the-counter and prescription medicines only as told by your health care provider.  If you smoke, do not smoke without supervision.  Keep all follow-up visits as told by your health care provider. This is important. Contact a health care provider if:  You have nausea or vomiting that does not get better with medicine.  You cannot eat or drink without vomiting.  You have pain that does not get better with medicine.  You are unable to pass urine.  You develop a skin rash.  You have a fever.  You have redness around your IV site that gets worse. Get help right away if:  You have difficulty breathing.  You have chest pain.  You have blood in your urine or stool, or you vomit blood. Summary  After the procedure, it is common to have a sore throat or nausea. It is also common to feel tired.  Have a responsible adult stay with you for the first 24 hours after general anesthesia. It is important to have someone help care for you until you are awake and alert.  When you feel hungry, start by eating small amounts of foods that are soft and easy to digest (bland), such as toast. Gradually return to your regular diet.  Drink enough fluid to keep your urine pale yellow.  Return to your normal activities as told by your health care provider. Ask your health care provider what activities are safe for you. This information is not intended to replace advice given to you by your health care provider. Make sure you discuss any questions you have with your health care provider. Document Revised: 04/29/2017 Document Reviewed: 12/10/2016 Elsevier Patient Education  2020 Elsevier Inc.   

## 2020-01-18 NOTE — Transfer of Care (Signed)
Immediate Anesthesia Transfer of Care Note  Patient: Hunter Smith  Procedure(s) Performed: INSERTION PORT-A-CATH (Right Chest)  Patient Location: PACU  Anesthesia Type:General  Level of Consciousness: awake, alert , oriented and patient cooperative  Airway & Oxygen Therapy: Patient Spontanous Breathing and Patient connected to nasal cannula oxygen  Post-op Assessment: Report given to RN and Post -op Vital signs reviewed and stable  Post vital signs: Reviewed and stable  Last Vitals:  Vitals Value Taken Time  BP    Temp    Pulse    Resp    SpO2      Last Pain:  Vitals:   01/18/20 0956  PainSc: 0-No pain         Complications: No complications documented.

## 2020-01-18 NOTE — Anesthesia Preprocedure Evaluation (Signed)
Anesthesia Evaluation  Patient identified by MRN, date of birth, ID band Patient awake    Reviewed: Allergy & Precautions, H&P , NPO status , Patient's Chart, lab work & pertinent test results, reviewed documented beta blocker date and time   Airway Mallampati: II  TM Distance: >3 FB Neck ROM: full    Dental no notable dental hx.    Pulmonary neg pulmonary ROS, former smoker,    Pulmonary exam normal breath sounds clear to auscultation       Cardiovascular Exercise Tolerance: Good negative cardio ROS   Rhythm:regular Rate:Normal     Neuro/Psych negative neurological ROS  negative psych ROS   GI/Hepatic negative GI ROS, Neg liver ROS,   Endo/Other  negative endocrine ROS  Renal/GU negative Renal ROS  negative genitourinary   Musculoskeletal   Abdominal   Peds  Hematology  (+) Blood dyscrasia, anemia ,   Anesthesia Other Findings   Reproductive/Obstetrics negative OB ROS                             Anesthesia Physical Anesthesia Plan  ASA: III  Anesthesia Plan: General   Post-op Pain Management:    Induction:   PONV Risk Score and Plan: Propofol infusion  Airway Management Planned:   Additional Equipment:   Intra-op Plan:   Post-operative Plan:   Informed Consent: I have reviewed the patients History and Physical, chart, labs and discussed the procedure including the risks, benefits and alternatives for the proposed anesthesia with the patient or authorized representative who has indicated his/her understanding and acceptance.     Dental Advisory Given  Plan Discussed with: CRNA  Anesthesia Plan Comments:         Anesthesia Quick Evaluation

## 2020-01-18 NOTE — Interval H&P Note (Signed)
History and Physical Interval Note:  01/18/2020 10:42 AM  Hunter Smith  has presented today for surgery, with the diagnosis of Myelodysplastic syndrome; high grade.  The various methods of treatment have been discussed with the patient and family. After consideration of risks, benefits and other options for treatment, the patient has consented to  Procedure(s) with comments: INSERTION PORT-A-CATH - pt needs to get platelets before surgery as a surgical intervention.  The patient's history has been reviewed, patient examined, no change in status, stable for surgery.  I have reviewed the patient's chart and labs.  Questions were answered to the patient's satisfaction.     Aviva Signs

## 2020-01-19 LAB — PREPARE PLATELET PHERESIS: Unit division: 0

## 2020-01-19 LAB — BPAM PLATELET PHERESIS
Blood Product Expiration Date: 202109112359
ISSUE DATE / TIME: 202109100829
Unit Type and Rh: 6200

## 2020-01-21 ENCOUNTER — Inpatient Hospital Stay (HOSPITAL_COMMUNITY): Payer: 59

## 2020-01-21 ENCOUNTER — Other Ambulatory Visit: Payer: Self-pay

## 2020-01-21 ENCOUNTER — Encounter (HOSPITAL_COMMUNITY): Payer: Self-pay | Admitting: *Deleted

## 2020-01-21 ENCOUNTER — Encounter (HOSPITAL_COMMUNITY): Payer: Self-pay

## 2020-01-21 DIAGNOSIS — D46Z Other myelodysplastic syndromes: Secondary | ICD-10-CM

## 2020-01-21 DIAGNOSIS — D469 Myelodysplastic syndrome, unspecified: Secondary | ICD-10-CM | POA: Diagnosis not present

## 2020-01-21 LAB — CBC WITH DIFFERENTIAL/PLATELET
Band Neutrophils: 4 %
Basophils Absolute: 0.6 10*3/uL — ABNORMAL HIGH (ref 0.0–0.1)
Basophils Relative: 2 %
Blasts: 2 %
Eosinophils Absolute: 0.3 10*3/uL (ref 0.0–0.5)
Eosinophils Relative: 1 %
HCT: 25.5 % — ABNORMAL LOW (ref 39.0–52.0)
Hemoglobin: 8.4 g/dL — ABNORMAL LOW (ref 13.0–17.0)
Lymphocytes Relative: 11 %
Lymphs Abs: 3.5 10*3/uL (ref 0.7–4.0)
MCH: 33.9 pg (ref 26.0–34.0)
MCHC: 32.9 g/dL (ref 30.0–36.0)
MCV: 102.8 fL — ABNORMAL HIGH (ref 80.0–100.0)
Metamyelocytes Relative: 10 %
Monocytes Absolute: 10.2 10*3/uL — ABNORMAL HIGH (ref 0.1–1.0)
Monocytes Relative: 32 %
Myelocytes: 3 %
Neutro Abs: 9.6 10*3/uL — ABNORMAL HIGH (ref 1.7–7.7)
Neutrophils Relative %: 26 %
Platelets: 23 10*3/uL — CL (ref 150–400)
Promyelocytes Relative: 9 %
RBC: 2.48 MIL/uL — ABNORMAL LOW (ref 4.22–5.81)
RDW: 23 % — ABNORMAL HIGH (ref 11.5–15.5)
WBC: 32 10*3/uL — ABNORMAL HIGH (ref 4.0–10.5)
nRBC: 0 % (ref 0.0–0.2)

## 2020-01-21 LAB — COMPREHENSIVE METABOLIC PANEL
ALT: 25 U/L (ref 0–44)
AST: 27 U/L (ref 15–41)
Albumin: 3.8 g/dL (ref 3.5–5.0)
Alkaline Phosphatase: 41 U/L (ref 38–126)
Anion gap: 11 (ref 5–15)
BUN: 18 mg/dL (ref 6–20)
CO2: 24 mmol/L (ref 22–32)
Calcium: 9.2 mg/dL (ref 8.9–10.3)
Chloride: 102 mmol/L (ref 98–111)
Creatinine, Ser: 1.08 mg/dL (ref 0.61–1.24)
GFR calc Af Amer: >60 mL/min (ref >60)
GFR calc non Af Amer: >60 mL/min (ref >60)
Glucose, Bld: 120 mg/dL — ABNORMAL HIGH (ref 70–99)
Potassium: 4.2 mmol/L (ref 3.5–5.1)
Sodium: 137 mmol/L (ref 135–145)
Total Bilirubin: 0.7 mg/dL (ref 0.3–1.2)
Total Protein: 6.8 g/dL (ref 6.5–8.1)

## 2020-01-21 LAB — SAMPLE TO BLOOD BANK

## 2020-01-21 MED ORDER — HEPARIN SOD (PORK) LOCK FLUSH 100 UNIT/ML IV SOLN
500.0000 [IU] | Freq: Once | INTRAVENOUS | Status: AC
  Administered 2020-01-21: 500 [IU] via INTRAVENOUS

## 2020-01-21 MED ORDER — SODIUM CHLORIDE 0.9% FLUSH
20.0000 mL | INTRAVENOUS | Status: DC | PRN
  Administered 2020-01-21: 20 mL via INTRAVENOUS

## 2020-01-21 NOTE — Progress Notes (Signed)
Critical value alert:   Received Platelet count 23 at 1000.   Dr. Delton Coombes aware. No orders regarding platelets at this time   Dr. Delton Coombes observed elevated WBC, wants patient to recheck labs and have office visit this week on Thursday.   Patient aware of new appts this week.

## 2020-01-21 NOTE — Progress Notes (Signed)
Hunter Smith tolerated portacath lab draw well without complaints or incident. Port accessed with 20 gauge needle with blood drawn for labs ordered then flushed easily per protocol and de-accessed. VSS Pt discharged self ambulatory in satisfactory condition

## 2020-01-21 NOTE — Patient Instructions (Signed)
[facility] at [facility] Discharge Instructions  Labs drawn from portacath today   Thank you for choosing [facility] at [facility] to provide your oncology and hematology care.  To afford each patient quality time with our provider, please arrive at least 15 minutes before your scheduled appointment time.   If you have a lab appointment with the Cancer Center please come in thru the Main Entrance and check in at the main information desk.  You need to re-schedule your appointment should you arrive 10 or more minutes late.  We strive to give you quality time with our providers, and arriving late affects you and other patients whose appointments are after yours.  Also, if you no show three or more times for appointments you may be dismissed from the clinic at the providers discretion.     Again, thank you for choosing [facility].  Our hope is that these requests will decrease the amount of time that you wait before being seen by our physicians.       _____________________________________________________________  Should you have questions after your visit to [facility], please contact our office at [phone] and follow the prompts.  Our office hours are 8:00 a.m. and 4:30 p.m. Monday - Friday.  Please note that voicemails left after 4:00 p.m. may not be returned until the following business day.  We are closed weekends and major holidays.  You do have access to a nurse 24-7, just call the main number to the clinic [phone] and do not press any options, hold on the line and a nurse will answer the phone.    For prescription refill requests, have your pharmacy contact our office and allow 72 hours.    Due to Covid, you will need to wear a mask upon entering the hospital. If you do not have a mask, a mask will be given to you at the Main Entrance upon arrival. For doctor visits, patients may have 1 support person age 18  or older with them. For treatment visits, patients can not have anyone with them due to social distancing guidelines and our immunocompromised population.     

## 2020-01-24 ENCOUNTER — Inpatient Hospital Stay (HOSPITAL_BASED_OUTPATIENT_CLINIC_OR_DEPARTMENT_OTHER): Payer: 59 | Admitting: Hematology

## 2020-01-24 ENCOUNTER — Inpatient Hospital Stay (HOSPITAL_COMMUNITY): Payer: 59

## 2020-01-24 ENCOUNTER — Other Ambulatory Visit: Payer: Self-pay

## 2020-01-24 VITALS — BP 145/83 | HR 120 | Temp 97.2°F | Resp 18 | Wt 221.2 lb

## 2020-01-24 DIAGNOSIS — D696 Thrombocytopenia, unspecified: Secondary | ICD-10-CM

## 2020-01-24 DIAGNOSIS — D539 Nutritional anemia, unspecified: Secondary | ICD-10-CM | POA: Diagnosis not present

## 2020-01-24 DIAGNOSIS — D469 Myelodysplastic syndrome, unspecified: Secondary | ICD-10-CM | POA: Diagnosis not present

## 2020-01-24 DIAGNOSIS — D46Z Other myelodysplastic syndromes: Secondary | ICD-10-CM | POA: Diagnosis not present

## 2020-01-24 LAB — COMPREHENSIVE METABOLIC PANEL
ALT: 30 U/L (ref 0–44)
AST: 34 U/L (ref 15–41)
Albumin: 3.9 g/dL (ref 3.5–5.0)
Alkaline Phosphatase: 48 U/L (ref 38–126)
Anion gap: 8 (ref 5–15)
BUN: 19 mg/dL (ref 6–20)
CO2: 25 mmol/L (ref 22–32)
Calcium: 9.4 mg/dL (ref 8.9–10.3)
Chloride: 104 mmol/L (ref 98–111)
Creatinine, Ser: 1.08 mg/dL (ref 0.61–1.24)
GFR calc Af Amer: >60 mL/min (ref >60)
GFR calc non Af Amer: >60 mL/min (ref >60)
Glucose, Bld: 108 mg/dL — ABNORMAL HIGH (ref 70–99)
Potassium: 3.5 mmol/L (ref 3.5–5.1)
Sodium: 137 mmol/L (ref 135–145)
Total Bilirubin: 0.7 mg/dL (ref 0.3–1.2)
Total Protein: 7.1 g/dL (ref 6.5–8.1)

## 2020-01-24 LAB — CBC WITH DIFFERENTIAL/PLATELET
Basophils Absolute: 0 10*3/uL (ref 0.0–0.1)
Basophils Relative: 0 %
Blasts: 11 %
Eosinophils Absolute: 0 10*3/uL (ref 0.0–0.5)
Eosinophils Relative: 0 %
HCT: 24.7 % — ABNORMAL LOW (ref 39.0–52.0)
Hemoglobin: 8.2 g/dL — ABNORMAL LOW (ref 13.0–17.0)
Lymphocytes Relative: 13 %
Lymphs Abs: 5.9 10*3/uL — ABNORMAL HIGH (ref 0.7–4.0)
MCH: 34.3 pg — ABNORMAL HIGH (ref 26.0–34.0)
MCHC: 33.2 g/dL (ref 30.0–36.0)
MCV: 103.3 fL — ABNORMAL HIGH (ref 80.0–100.0)
Metamyelocytes Relative: 13 %
Monocytes Absolute: 11.8 10*3/uL — ABNORMAL HIGH (ref 0.1–1.0)
Monocytes Relative: 26 %
Myelocytes: 12 %
Neutro Abs: 8.6 10*3/uL — ABNORMAL HIGH (ref 1.7–7.7)
Neutrophils Relative %: 19 %
Platelets: 21 10*3/uL — CL (ref 150–400)
Promyelocytes Relative: 6 %
RBC: 2.39 MIL/uL — ABNORMAL LOW (ref 4.22–5.81)
RDW: 23.2 % — ABNORMAL HIGH (ref 11.5–15.5)
WBC: 45.5 10*3/uL — ABNORMAL HIGH (ref 4.0–10.5)
nRBC: 0.1 % (ref 0.0–0.2)

## 2020-01-24 LAB — SAMPLE TO BLOOD BANK

## 2020-01-24 LAB — LACTATE DEHYDROGENASE: LDH: 562 U/L — ABNORMAL HIGH (ref 98–192)

## 2020-01-24 MED ORDER — HEPARIN SOD (PORK) LOCK FLUSH 100 UNIT/ML IV SOLN
500.0000 [IU] | Freq: Once | INTRAVENOUS | Status: AC
  Administered 2020-01-24: 500 [IU] via INTRAVENOUS

## 2020-01-24 MED ORDER — SODIUM CHLORIDE 0.9% FLUSH
10.0000 mL | Freq: Once | INTRAVENOUS | Status: AC
  Administered 2020-01-24: 10 mL via INTRAVENOUS

## 2020-01-24 NOTE — Patient Instructions (Signed)
[place] at [facility] Discharge Instructions  You were seen today by Dr. Delton Coombes. He went over your recent results. You may proceed with your treatment on 9/20. Dr. Delton Coombes will see you back on 9/27 for labs and follow up.   Thank you for choosing [place] at [facility] to provide your oncology and hematology care.  To afford each patient quality time with our provider, please arrive at least 15 minutes before your scheduled appointment time.   If you have a lab appointment with the [facility] please come in thru the Main Entrance and check in at the main information desk  You need to re-schedule your appointment should you arrive 10 or more minutes late.  We strive to give you quality time with our providers, and arriving late affects you and other patients whose appointments are after yours.  Also, if you no show three or more times for appointments you may be dismissed from the clinic at the providers discretion.     Again, thank you for choosing [facility].  Our hope is that these requests will decrease the amount of time that you wait before being seen by our physicians.       _____________________________________________________________  Should you have questions after your visit to [facility], please contact our office at [phone] between the hours of 8:00 a.m. and 4:30 p.m.  Voicemails left after 4:00 p.m. will not be returned until the following business day.  For prescription refill requests, have your pharmacy contact our office and allow 72 hours.    Cancer Center Support Programs:   > Cancer Support Group  2nd Tuesday of the month 1pm-2pm, Journey Room

## 2020-01-24 NOTE — Progress Notes (Signed)
[place], [place] [id number]   CLINIC:  Medical Oncology/Hematology  PCP:  Celene Squibb, MD [address] [name] [address]  [phone]  REASON FOR VISIT:  Follow-up for MDS, macrocytic anemia and thrombocytopenia  PRIOR THERAPY: None  CURRENT THERAPY: Azacitidine monthly  INTERVAL HISTORY:  [name], a 59 y.o. male, returns for routine follow-up for his MDS, macrocytic anemia and thrombocytopenia. Hunter Smith was last seen on 12/31/2019.  Today he is accompanied by his wife. He reports feeling well overall. He also reports that he was feeling mildly feverish after the azacitidine and was taking Tylenol to control it. He has felt warm and sweaty around his neck and chest for the past 3 days and he takes Tylenol for that as well. He had 2 units of PRBC and 1 unit of platelets on 9/7 and 1 more unit of platelets on 9/9. His appetite is good and he denies having abdominal pain. He is taking stool softener for his constipation and he denies having hematochezia. He has some aching around the port site.   REVIEW OF SYSTEMS:  Review of Systems  Constitutional: Positive for appetite change (mildly decreased), fatigue and fever.  Respiratory: Positive for shortness of breath (w/ exertion).   Cardiovascular: Positive for chest pain (2/10 pain around port site).  Gastrointestinal: Positive for constipation (on stool softener). Negative for abdominal pain and blood in stool.  Neurological: Positive for headaches.  All other systems reviewed and are negative.   PAST MEDICAL/SURGICAL HISTORY:  Past Medical History:  Diagnosis Date  . Port-A-Cath in place 12/28/2019   Past Surgical History:  Procedure Laterality Date  . COLONOSCOPY    . PORTACATH PLACEMENT Right 01/18/2020   Procedure: INSERTION PORT-A-CATH;  Surgeon: Aviva Signs, MD;  Location: [facility];  Service: General;  Laterality: Right;    SOCIAL HISTORY:  Social History    Socioeconomic History  . Marital status: Married    Spouse name: Not on file  . Number of children: Not on file  . Years of education: Not on file  . Highest education level: Not on file  Occupational History  . Occupation: Employed  Tobacco Use  . Smoking status: Former Smoker    Types: Cigarettes    Quit date: 05/03/1988    Years since quitting: 31.7  . Smokeless tobacco: Never Used  Substance and Sexual Activity  . Alcohol use: Yes    Alcohol/week: 1.0 - 2.0 standard drink    Types: 1 - 2 Standard drinks or equivalent per week  . Drug use: Never  . Sexual activity: Yes  Other Topics Concern  . Not on file  Social History Narrative  . Not on file   Social Determinants of Health   Financial Resource Strain: Low Risk   . Difficulty of Paying Living Expenses: Not hard at all  Food Insecurity: No Food Insecurity  . Worried About Charity fundraiser in the Last Year: Never true  . Ran Out of Food in the Last Year: Never true  Transportation Needs: No Transportation Needs  . Lack of Transportation (Medical): No  . Lack of Transportation (Non-Medical): No  Physical Activity:   . Days of Exercise per Week: Not on file  . Minutes of Exercise per Session: Not on file  Stress:   . Feeling of Stress : Not on file  Social Connections: Unknown  . Frequency of Communication with Friends and Family: More than three  times a week  . Frequency of Social Gatherings with Friends and Family: More than three times a week  . Attends Religious Services: Not on file  . Active Member of Clubs or Organizations: Not on file  . Attends Archivist Meetings: Not on file  . Marital Status: Married  Human resources officer Violence:   . Fear of Current or Ex-Partner: Not on file  . Emotionally Abused: Not on file  . Physically Abused: Not on file  . Sexually Abused: Not on file    FAMILY HISTORY:  Family History  Problem Relation Age of Onset  . Bladder Cancer Father   . Scoliosis  Sister     CURRENT MEDICATIONS:  Current Outpatient Medications  Medication Sig Dispense Refill  . azaCITIDine 5 mg/2 mLs in lactated ringers infusion Inject into the vein daily. Days 1-7 every 28 days    . docusate sodium (COLACE) 100 MG capsule Take 100 mg by mouth 2 (two) times daily.    . Multiple Vitamin (MULTIVITAMIN) tablet Take 1 tablet by mouth daily.    Marland Kitchen HYDROcodone-acetaminophen (NORCO) 5-325 MG tablet Take 1 tablet by mouth every 4 (four) hours as needed for moderate pain. (Patient not taking: Reported on 01/24/2020) 20 tablet 0  . lidocaine-prilocaine (EMLA) cream Apply a small amount to port a cath site and cover with plastic wrap 1 hour prior to chemotherapy appointments (Patient not taking: Reported on 01/24/2020) 30 g 3  . prochlorperazine (COMPAZINE) 10 MG tablet Take 1 tablet (10 mg total) by mouth every 6 (six) hours as needed (Nausea or vomiting). (Patient not taking: Reported on 01/24/2020) 30 tablet 1   No current facility-administered medications for this visit.   Facility-Administered Medications Ordered in Other Visits  Medication Dose Route Frequency Provider Last Rate Last Admin  . sodium chloride flush (NS) 0.9 % injection 10 mL  10 mL Intravenous Once Derek Jack, MD        ALLERGIES:  No Known Allergies  PHYSICAL EXAM:  Performance status (ECOG): 1 - Symptomatic but completely ambulatory  Vitals:   01/24/20 0755  BP: (!) 145/83  Pulse: (!) 120  Resp: 18  Temp: (!) 97.2 F (36.2 C)  SpO2: 97%   Wt Readings from Last 3 Encounters:  01/24/20 221 lb 3.2 oz (100.3 kg)  01/07/20 222 lb 12.8 oz (101.1 kg)  01/03/20 224 lb (101.6 kg)   Physical Exam Constitutional:      Appearance: Normal appearance.  Cardiovascular:     Rate and Rhythm: Normal rate and regular rhythm.     Pulses: Normal pulses.     Heart sounds: Normal heart sounds.  Pulmonary:     Effort: Pulmonary effort is normal.     Breath sounds: Normal breath sounds.  Chest:      Chest wall: Tenderness (aching around port on R chest) present.     Comments: Port-a-Cath in R chest Abdominal:     Palpations: Abdomen is soft. There is no hepatomegaly or mass.     Tenderness: There is no abdominal tenderness.     Hernia: No hernia is present.  Neurological:     General: No focal deficit present.     Mental Status: He is alert and oriented to person, place, and time.  Psychiatric:        Mood and Affect: Mood normal.        Behavior: Behavior normal.     LABORATORY DATA:  I have reviewed the labs as listed.  CBC Latest  Ref Rng & Units 01/24/2020 01/21/2020 01/15/2020  WBC 4.0 - 10.5 K/uL 45.5(H) 32.0(H) 18.0(H)  Hemoglobin 13.0 - 17.0 g/dL 8.2(L) 8.4(L) 6.3(LL)  Hematocrit 39 - 52 % 24.7(L) 25.5(L) 19.9(L)  Platelets 150 - 400 K/uL 21(LL) 23(LL) 17(LL)   CMP Latest Ref Rng & Units 01/24/2020 01/21/2020 01/15/2020  Glucose 70 - 99 mg/dL 108(H) 120(H) 130(H)  BUN 6 - 20 mg/dL _0 Creatinine 0.61 - 1.24 mg/dL 1.08 1.08 1.06  Sodium 135 - 145 mmol/L 137 137 138  Potassium 3.5 - 5.1 mmol/L 3.5 4.2 4.1  Chloride 98 - 111 mmol/L 104 102 105  CO2 22 - 32 mmol/L _1 Calcium 8.9 - 10.3 mg/dL 9.4 9.2 8.5(L)  Total Protein 6.5 - 8.1 g/dL 7.1 6.8 6.3(L)  Total Bilirubin 0.3 - 1.2 mg/dL 0.7 0.7 1.1  Alkaline Phos 38 - 126 U/L 48 41 36(L)  AST 15 - 41 U/L 34 27 24  ALT 0 - 44 U/L _2 Component Value Date/Time   RBC 2.39 (L) 01/24/2020 0757   MCV 103.3 (H) 01/24/2020 0757   MCH 34.3 (H) 01/24/2020 0757   MCHC 33.2 01/24/2020 0757   RDW 23.2 (H) 01/24/2020 0757   LYMPHSABS 5.9 (H) 01/24/2020 0757   MONOABS 11.8 (H) 01/24/2020 0757   EOSABS 0.0 01/24/2020 0757   BASOSABS 0.0 01/24/2020 0757    DIAGNOSTIC IMAGING:  I have independently reviewed the scans and discussed with the patient. DG Chest Port 1 View  Result Date: 01/18/2020 CLINICAL DATA:  59 year old male Port-A-Cath placement. Myelodysplastic syndrome. EXAM: PORTABLE CHEST 1 VIEW  COMPARISON:  None. FINDINGS: Portable AP upright view at 1128 hours. Right chest subclavian approach power port placed. Catheter tip is at the level of the carina, SVC. No adverse features identified. The patient is rotated to the right. No pneumothorax. Mediastinal contours within normal limits. Visualized tracheal air column is within normal limits. Allowing for portable technique the lungs are clear. Visible osseous structures within normal limits. IMPRESSION: Right chest power port placed. No acute cardiopulmonary abnormality. Electronically Signed   By: Genevie Ann M.D.   On: 01/18/2020 11:48   DG C-Arm 1-60 Min-No Report  Result Date: 01/18/2020 Fluoroscopy was utilized by the requesting physician.  No radiographic interpretation.     ASSESSMENT:  1.High-grade MDS with excess blasts/CMML: -Presentation with pancytopenia and transfusion dependent anemia. -Bone marrow biopsy on 12/14/2019 at [facility] showed hypercellular marrow (90%) with dyserythropoiesis, dysgranulopoiesis and 16% blasts. Findings consistent with MDS-EB 2. FLT3 ITD/TKD negative. -Chromosome analysis 47, XY, +8[19]/46, XY[1]. -MDS FISH panel pending. -However his last 2 CBC showing increased white count of 14-15 K with peripheral blood monocytosis, also likely CMML. -He was evaluated by Dr. Jerrye Noble at [facility]. Bone marrow biopsy after cycle 2 at [facility] recommended. -Cycle 1 of azacitidine (5+2) on 12/31/2019.   PLAN:  1.High-grade MDS/CMML: -He has received 2 units of PRBC and 2 units of platelets in the last 3 weeks since the start of therapy. -He did not experience any major side effects. -However his white count has been going up.  This has gone up from 18,000 on the day of treatment 45,000 today.  Today his blast count has also increased. -I will reach out to Dr. Florene Glen for further plan.  2. Thrombocytopenia: -Platelet count is 21 today.  No active bleeding issues. -He had some  bruising after the placement of port on the right  chest wall.  3. Macrocytic anemia: -He has received 2 units of irradiated PRBC since 12/31/2019.  Today hemoglobin is 8.2.  No transfusion necessary.  Orders placed this encounter:  Orders Placed This Encounter  Procedures  . Lactate dehydrogenase     Derek Jack, MD [place] [phone]   I, Milinda Antis, am acting as a scribe for Dr. Sanda Linger.  I, Derek Jack MD, have reviewed the above documentation for accuracy and completeness, and I agree with the above.

## 2020-01-24 NOTE — Progress Notes (Signed)
CRITICAL VALUE ALERT  Critical Value:  Platelets 21  Date & Time Notied:  01/24/2020 at [place]  Provider Notified: Dr. Delton Coombes  Orders Received/Actions taken: n/a

## 2020-01-24 NOTE — Progress Notes (Signed)
Patients port flushed without difficulty.  Good blood return noted with no bruising or swelling noted at site.  Band aid applied.  VSS with discharge and left in satisfactory condition with no s/s of distress noted.   

## 2020-01-28 ENCOUNTER — Inpatient Hospital Stay (HOSPITAL_COMMUNITY): Payer: 59

## 2020-01-28 ENCOUNTER — Ambulatory Visit (HOSPITAL_COMMUNITY): Payer: 59 | Admitting: Hematology

## 2020-01-29 ENCOUNTER — Ambulatory Visit (HOSPITAL_COMMUNITY): Payer: 59

## 2020-01-30 ENCOUNTER — Ambulatory Visit (HOSPITAL_COMMUNITY): Payer: 59

## 2020-01-31 ENCOUNTER — Ambulatory Visit (HOSPITAL_COMMUNITY): Payer: 59

## 2020-02-01 ENCOUNTER — Ambulatory Visit (HOSPITAL_COMMUNITY): Payer: 59

## 2020-02-04 ENCOUNTER — Inpatient Hospital Stay (HOSPITAL_COMMUNITY): Payer: 59

## 2020-02-04 ENCOUNTER — Encounter (HOSPITAL_COMMUNITY): Payer: 59

## 2020-02-04 ENCOUNTER — Encounter (HOSPITAL_COMMUNITY): Payer: Self-pay | Admitting: Hematology

## 2020-02-04 ENCOUNTER — Other Ambulatory Visit (HOSPITAL_COMMUNITY): Payer: 59

## 2020-02-04 ENCOUNTER — Other Ambulatory Visit: Payer: Self-pay

## 2020-02-04 ENCOUNTER — Ambulatory Visit (HOSPITAL_COMMUNITY): Payer: 59 | Admitting: Hematology

## 2020-02-04 ENCOUNTER — Inpatient Hospital Stay (HOSPITAL_BASED_OUTPATIENT_CLINIC_OR_DEPARTMENT_OTHER): Payer: 59 | Admitting: Hematology

## 2020-02-04 ENCOUNTER — Other Ambulatory Visit (HOSPITAL_COMMUNITY): Payer: Self-pay

## 2020-02-04 VITALS — BP 143/77 | HR 102 | Temp 96.9°F | Resp 18 | Wt 222.6 lb

## 2020-02-04 DIAGNOSIS — D696 Thrombocytopenia, unspecified: Secondary | ICD-10-CM

## 2020-02-04 DIAGNOSIS — D46Z Other myelodysplastic syndromes: Secondary | ICD-10-CM

## 2020-02-04 DIAGNOSIS — D539 Nutritional anemia, unspecified: Secondary | ICD-10-CM

## 2020-02-04 DIAGNOSIS — D469 Myelodysplastic syndrome, unspecified: Secondary | ICD-10-CM | POA: Diagnosis not present

## 2020-02-04 LAB — CBC WITH DIFFERENTIAL/PLATELET
Abs Immature Granulocytes: 6.19 10*3/uL — ABNORMAL HIGH (ref 0.00–0.07)
Band Neutrophils: 5 %
Basophils Absolute: 0.6 10*3/uL — ABNORMAL HIGH (ref 0.0–0.1)
Basophils Relative: 2 %
Blasts: 2 %
Eosinophils Absolute: 0.6 10*3/uL — ABNORMAL HIGH (ref 0.0–0.5)
Eosinophils Relative: 2 %
HCT: 20.8 % — ABNORMAL LOW (ref 39.0–52.0)
Hemoglobin: 6.7 g/dL — CL (ref 13.0–17.0)
Immature Granulocytes: 23 %
Lymphocytes Relative: 14 %
Lymphs Abs: 3.9 10*3/uL (ref 0.7–4.0)
MCH: 32.8 pg (ref 26.0–34.0)
MCHC: 32.2 g/dL (ref 30.0–36.0)
MCV: 102 fL — ABNORMAL HIGH (ref 80.0–100.0)
Metamyelocytes Relative: 9 %
Monocytes Absolute: 8.8 10*3/uL — ABNORMAL HIGH (ref 0.1–1.0)
Monocytes Relative: 32 %
Myelocytes: 1 %
Neutro Abs: 13.2 10*3/uL — ABNORMAL HIGH (ref 1.7–7.7)
Neutrophils Relative %: 28 %
Platelets: 21 10*3/uL — CL (ref 150–400)
Promyelocytes Relative: 5 %
RBC: 2.04 MIL/uL — ABNORMAL LOW (ref 4.22–5.81)
RDW: 21.5 % — ABNORMAL HIGH (ref 11.5–15.5)
WBC: 27.5 10*3/uL — ABNORMAL HIGH (ref 4.0–10.5)
nRBC: 0 % (ref 0.0–0.2)
nRBC: 0 /100 WBC

## 2020-02-04 LAB — COMPREHENSIVE METABOLIC PANEL
ALT: 26 U/L (ref 0–44)
AST: 24 U/L (ref 15–41)
Albumin: 3.7 g/dL (ref 3.5–5.0)
Alkaline Phosphatase: 44 U/L (ref 38–126)
Anion gap: 9 (ref 5–15)
BUN: 18 mg/dL (ref 6–20)
CO2: 25 mmol/L (ref 22–32)
Calcium: 8.8 mg/dL — ABNORMAL LOW (ref 8.9–10.3)
Chloride: 104 mmol/L (ref 98–111)
Creatinine, Ser: 1.13 mg/dL (ref 0.61–1.24)
GFR calc Af Amer: >60 mL/min (ref >60)
GFR calc non Af Amer: >60 mL/min (ref >60)
Glucose, Bld: 117 mg/dL — ABNORMAL HIGH (ref 70–99)
Potassium: 3.9 mmol/L (ref 3.5–5.1)
Sodium: 138 mmol/L (ref 135–145)
Total Bilirubin: 0.6 mg/dL (ref 0.3–1.2)
Total Protein: 6.6 g/dL (ref 6.5–8.1)

## 2020-02-04 LAB — PREPARE RBC (CROSSMATCH)

## 2020-02-04 MED ORDER — HEPARIN SOD (PORK) LOCK FLUSH 100 UNIT/ML IV SOLN
500.0000 [IU] | Freq: Once | INTRAVENOUS | Status: AC
  Administered 2020-02-04: 500 [IU] via INTRAVENOUS

## 2020-02-04 NOTE — Patient Instructions (Signed)
[place] at [facility] Discharge Instructions  You were seen today by Dr. Delton Coombes. He went over your recent results. You received 1 unit of blood today. Dr. Delton Coombes will see you back in 1 week for labs and follow up.   Thank you for choosing [place] at [facility] to provide your oncology and hematology care.  To afford each patient quality time with our provider, please arrive at least 15 minutes before your scheduled appointment time.   If you have a lab appointment with the [facility] please come in thru the Main Entrance and check in at the main information desk  You need to re-schedule your appointment should you arrive 10 or more minutes late.  We strive to give you quality time with our providers, and arriving late affects you and other patients whose appointments are after yours.  Also, if you no show three or more times for appointments you may be dismissed from the clinic at the providers discretion.     Again, thank you for choosing [facility].  Our hope is that these requests will decrease the amount of time that you wait before being seen by our physicians.       _____________________________________________________________  Should you have questions after your visit to [facility] please contact our office at [phone] between the hours of 8:00 a.m. and 4:30 p.m.  Voicemails left after 4:00 p.m. will not be returned until the following business day.  For prescription refill requests, have your pharmacy contact our office and allow 72 hours.    Cancer Center Support Programs:   > Cancer Support Group  2nd Tuesday of the month 1pm-2pm, Journey Room

## 2020-02-04 NOTE — Patient Instructions (Signed)
[facility] at [facility] Discharge Instructions  Labs drawn from portacath today   Thank you for choosing  [facility] at [facility] to provide your oncology and hematology care.  To afford each patient quality time with our provider, please arrive at least 15 minutes before your scheduled appointment time.   If you have a lab appointment with the Cancer Center please come in thru the Main Entrance and check in at the main information desk.  You need to re-schedule your appointment should you arrive 10 or more minutes late.  We strive to give you quality time with our providers, and arriving late affects you and other patients whose appointments are after yours.  Also, if you no show three or more times for appointments you may be dismissed from the clinic at the providers discretion.     Again, thank you for choosing [facility].  Our hope is that these requests will decrease the amount of time that you wait before being seen by our physicians.       _____________________________________________________________  Should you have questions after your visit to [facility], please contact our office at [phone] and follow the prompts.  Our office hours are 8:00 a.m. and 4:30 p.m. Monday - Friday.  Please note that voicemails left after 4:00 p.m. may not be returned until the following business day.  We are closed weekends and major holidays.  You do have access to a nurse 24-7, just call the main number to the clinic [phone] and do not press any options, hold on the line and a nurse will answer the phone.    For prescription refill requests, have your pharmacy contact our office and allow 72 hours.    Due to Covid, you will need to wear a mask upon entering the hospital. If you do not have a mask, a mask will be given to you at the Main Entrance upon arrival. For doctor visits, patients may have 1 support person age 18  or older with them. For treatment visits, patients can not have anyone with them due to social distancing guidelines and our immunocompromised population.     

## 2020-02-04 NOTE — Progress Notes (Signed)
CRITICAL VALUE ALERT  Critical Value:  HGB 6.4. Platelets 21.   Date & Time Notied:  02/04/20 @ 0910am by Omar Person   Provider Notified: Dr. Delton Coombes  Orders Received/Actions taken: Infuse 1 unit of PRBC's VO Dr. Delton Coombes.    Vital signs stable. No complaints at this time. Per Dr. Delton Coombes may infuse blood products tomorrow. Patient asymptomatic. Blood has to be irradiated and coming from [place] today.  Per patient request, blood will be infused tomorrow. Scheduling notified.  Discharged from clinic ambulatory in stable condition. Alert and oriented x 3. F/U with [facility] as scheduled.

## 2020-02-04 NOTE — Progress Notes (Signed)
Hunter Smith, [place] [address]   CLINIC:  Medical Oncology/Hematology  PCP:  Celene Squibb, MD [address] [place] [address]  [phone]  REASON FOR VISIT:  Follow-up for MDS, macrocytic anemia and thrombocytopenia  PRIOR THERAPY: None  CURRENT THERAPY: Azacitidine monthly  INTERVAL HISTORY:  Hunter Smith, a 59 y.o. male, returns for routine follow-up for his MDS, macrocytic anemia and thrombocytopenia. Hunter Smith was last seen on 01/24/2020.  Today Hunter Smith is accompanied by his wife. Hunter Smith reports that Hunter Smith feels fatigued, which is his normal. Hunter Smith received 1 unit of PRBC on 9/24 at [facility] when Hunter Smith had his bone marrow biopsy. Hunter Smith denies having cough, hemoptysis or hematochezia. His appetite is great and Hunter Smith is eating everything.   REVIEW OF SYSTEMS:  Review of Systems  Constitutional: Positive for fatigue (mild). Negative for appetite change.  Respiratory: Negative for cough and hemoptysis.   Gastrointestinal: Negative for blood in stool.  Musculoskeletal: Positive for arthralgias (1/10 L bone pain over biopsy site).  All other systems reviewed and are negative.   PAST MEDICAL/SURGICAL HISTORY:  Past Medical History:  Diagnosis Date  . Port-A-Cath in place 12/28/2019   Past Surgical History:  Procedure Laterality Date  . COLONOSCOPY    . PORTACATH PLACEMENT Right 01/18/2020   Procedure: INSERTION PORT-A-CATH;  Surgeon: Aviva Signs, MD;  Location: [facility];  Service: General;  Laterality: Right;    SOCIAL HISTORY:  Social History   Socioeconomic History  . Marital status: Married    Spouse name: Not on file  . Number of children: Not on file  . Years of education: Not on file  . Highest education level: Not on file  Occupational History  . Occupation: Employed  Tobacco Use  . Smoking status: Former Smoker    Types: Cigarettes    Quit date: 05/03/1988    Years since quitting: 31.7  . Smokeless tobacco: Never Used  Substance  and Sexual Activity  . Alcohol use: Yes    Alcohol/week: 1.0 - 2.0 standard drink    Types: 1 - 2 Standard drinks or equivalent per week  . Drug use: Never  . Sexual activity: Yes  Other Topics Concern  . Not on file  Social History Narrative  . Not on file   Social Determinants of Health   Financial Resource Strain: Low Risk   . Difficulty of Paying Living Expenses: Not hard at all  Food Insecurity: No Food Insecurity  . Worried About Charity fundraiser in the Last Year: Never true  . Ran Out of Food in the Last Year: Never true  Transportation Needs: No Transportation Needs  . Lack of Transportation (Medical): No  . Lack of Transportation (Non-Medical): No  Physical Activity:   . Days of Exercise per Week: Not on file  . Minutes of Exercise per Session: Not on file  Stress:   . Feeling of Stress : Not on file  Social Connections: Unknown  . Frequency of Communication with Friends and Family: More than three times a week  . Frequency of Social Gatherings with Friends and Family: More than three times a week  . Attends Religious Services: Not on file  . Active Member of Clubs or Organizations: Not on file  . Attends Archivist Meetings: Not on file  . Marital Status: Married  Human resources officer Violence:   . Fear of Current or Ex-Partner: Not on file  . Emotionally Abused: Not  on file  . Physically Abused: Not on file  . Sexually Abused: Not on file    FAMILY HISTORY:  Family History  Problem Relation Age of Onset  . Bladder Cancer Father   . Scoliosis Sister     CURRENT MEDICATIONS:  Current Outpatient Medications  Medication Sig Dispense Refill  . azaCITIDine 5 mg/2 mLs in lactated ringers infusion Inject into the vein daily. Days 1-7 every 28 days    . docusate sodium (COLACE) 100 MG capsule Take 100 mg by mouth 2 (two) times daily.    Marland Kitchen HYDROcodone-acetaminophen (NORCO) 5-325 MG tablet Take 1 tablet by mouth every 4 (four) hours as needed for moderate  pain. 20 tablet 0  . lidocaine-prilocaine (EMLA) cream Apply a small amount to port a cath site and cover with plastic wrap 1 hour prior to chemotherapy appointments 30 g 3  . Multiple Vitamin (MULTIVITAMIN) tablet Take 1 tablet by mouth daily.    . prochlorperazine (COMPAZINE) 10 MG tablet Take 1 tablet (10 mg total) by mouth every 6 (six) hours as needed (Nausea or vomiting). 30 tablet 1   No current facility-administered medications for this visit.    ALLERGIES:  No Known Allergies  PHYSICAL EXAM:  Performance status (ECOG): 1 - Symptomatic but completely ambulatory  Vitals:   02/04/20 0744  BP: (!) 143/77  Pulse: (!) 102  Resp: 18  Temp: (!) 96.9 F (36.1 C)  SpO2: 95%   Wt Readings from Last 3 Encounters:  02/04/20 222 lb 9.6 oz (101 kg)  01/24/20 221 lb 3.2 oz (100.3 kg)  01/07/20 222 lb 12.8 oz (101.1 kg)   Physical Exam Vitals reviewed.  Constitutional:      Appearance: Normal appearance. Hunter Smith is obese.  Cardiovascular:     Rate and Rhythm: Normal rate and regular rhythm.     Pulses: Normal pulses.     Heart sounds: Normal heart sounds.  Pulmonary:     Effort: Pulmonary effort is normal.     Breath sounds: Normal breath sounds.  Chest:     Comments: Port-a-Cath in R chest Neurological:     General: No focal deficit present.     Mental Status: Hunter Smith is alert and oriented to person, place, and time.  Psychiatric:        Mood and Affect: Mood normal.        Behavior: Behavior normal.     LABORATORY DATA:  I have reviewed the labs as listed.  CBC Latest Ref Rng & Units 02/04/2020 01/24/2020 01/21/2020  WBC 4.0 - 10.5 K/uL 27.5(H) 45.5(H) 32.0(H)  Hemoglobin 13.0 - 17.0 g/dL 6.7(LL) 8.2(L) 8.4(L)  Hematocrit 39 - 52 % 20.8(L) 24.7(L) 25.5(L)  Platelets 150 - 400 K/uL 21(LL) 21(LL) 23(LL)   CMP Latest Ref Rng & Units 02/04/2020 01/24/2020 01/21/2020  Glucose 70 - 99 mg/dL 117(H) 108(H) 120(H)  BUN 6 - 20 mg/dL 18 19 18   Creatinine 0.61 - 1.24 mg/dL 1.13 1.08 1.08   Sodium 135 - 145 mmol/L 138 137 137  Potassium 3.5 - 5.1 mmol/L 3.9 3.5 4.2  Chloride 98 - 111 mmol/L 104 104 102  CO2 22 - 32 mmol/L 25 25 24   Calcium 8.9 - 10.3 mg/dL 8.8(L) 9.4 9.2  Total Protein 6.5 - 8.1 g/dL 6.6 7.1 6.8  Total Bilirubin 0.3 - 1.2 mg/dL 0.6 0.7 0.7  Alkaline Phos 38 - 126 U/L 44 48 41  AST 15 - 41 U/L 24 34 27  ALT 0 - 44 U/L 26 30  25      Component Value Date/Time   RBC 2.04 (L) 02/04/2020 0745   MCV 102.0 (H) 02/04/2020 0745   MCH 32.8 02/04/2020 0745   MCHC 32.2 02/04/2020 0745   RDW 21.5 (H) 02/04/2020 0745   LYMPHSABS 3.3 02/04/2020 0745   MONOABS 9.6 (H) 02/04/2020 0745   EOSABS 0.4 02/04/2020 0745   BASOSABS 0.0 02/04/2020 0745    DIAGNOSTIC IMAGING:  I have independently reviewed the scans and discussed with the patient. DG Chest Port 1 View  Result Date: 01/18/2020 CLINICAL DATA:  59 year old male Port-A-Cath placement. Myelodysplastic syndrome. EXAM: PORTABLE CHEST 1 VIEW COMPARISON:  None. FINDINGS: Portable AP upright view at 1128 hours. Right chest subclavian approach power port placed. Catheter tip is at the level of the carina, SVC. No adverse features identified. The patient is rotated to the right. No pneumothorax. Mediastinal contours within normal limits. Visualized tracheal air column is within normal limits. Allowing for portable technique the lungs are clear. Visible osseous structures within normal limits. IMPRESSION: Right chest power port placed. No acute cardiopulmonary abnormality. Electronically Signed   By: Genevie Ann M.D.   On: 01/18/2020 11:48   DG C-Arm 1-60 Min-No Report  Result Date: 01/18/2020 Fluoroscopy was utilized by the requesting physician.  No radiographic interpretation.     ASSESSMENT:  1.High-grade MDS with excess blasts/CMML: -Presentation with pancytopenia and transfusion dependent anemia. -Bone marrow biopsy on 12/14/2019 at [facility] showed hypercellular marrow (90%) with dyserythropoiesis,  dysgranulopoiesis and 16% blasts. Findings consistent with MDS-EB 2. FLT3 ITD/TKD negative. -Chromosome analysis 47, XY, +8[19]/46, XY[1]. -MDS FISH panel pending. -However his last 2 CBC showing increased white count of 14-15 K with peripheral blood monocytosis, also likely CMML. -Hunter Smith was evaluated by Dr. Jerrye Noble at [facility]. Bone marrow biopsy after cycle 2 at [facility] recommended. -Cycle 1 of azacitidine (5+2) on 12/31/2019. -Bone marrow biopsy on 02/01/2020 with blasts ranging between 5-10%, down from 16% -New plan is to repeat bone marrow biopsy after cycle 3.   PLAN:  1.High-grade MDS/CMML: -Hunter Smith was evaluated and had a bone marrow biopsy done on 02/01/2020 at [facility]. -Hunter Smith also received 1 unit of PRBC. -CBC today shows hemoglobin 6.7 and platelet count of 21. -I have talked to Dr. Florene Glen at [facility].  His bone marrow biopsy blasts have improved from 15% to 5-10%. -Plan is to proceed with his cycle 2.  Plan is to repeat bone marrow biopsy after cycle 3. -We will call the patient to come back tomorrow to initiate his next cycle of Vidaza.  2. Thrombocytopenia: -No bleeding issues.  Platelet count is 21.  3. Macrocytic anemia: -Hemoglobin today 6.7. -We will transfuse 1 unit of irradiated PRBC tomorrow.  Orders placed this encounter:  No orders of the defined types were placed in this encounter.    Derek Jack, MD [place] [phone]   I, Milinda Antis, am acting as a scribe for Dr. Sanda Linger.  I, Derek Jack MD, have reviewed the above documentation for accuracy and completeness, and I agree with the above.

## 2020-02-04 NOTE — Progress Notes (Signed)
Patient hgb 6.7, primary RN given orders for 1 unit PRBC.  Patient will return next week for labs and possible infusions.

## 2020-02-05 ENCOUNTER — Inpatient Hospital Stay (HOSPITAL_COMMUNITY): Payer: 59

## 2020-02-05 ENCOUNTER — Ambulatory Visit (HOSPITAL_COMMUNITY): Payer: 59

## 2020-02-05 VITALS — BP 129/80 | HR 83 | Temp 97.0°F | Resp 18

## 2020-02-05 DIAGNOSIS — D46Z Other myelodysplastic syndromes: Secondary | ICD-10-CM

## 2020-02-05 DIAGNOSIS — D469 Myelodysplastic syndrome, unspecified: Secondary | ICD-10-CM | POA: Diagnosis not present

## 2020-02-05 DIAGNOSIS — Z95828 Presence of other vascular implants and grafts: Secondary | ICD-10-CM

## 2020-02-05 MED ORDER — SODIUM CHLORIDE 0.9 % IV SOLN
10.0000 mg | Freq: Once | INTRAVENOUS | Status: AC
  Administered 2020-02-05: 10 mg via INTRAVENOUS
  Filled 2020-02-05: qty 10

## 2020-02-05 MED ORDER — SODIUM CHLORIDE 0.9% IV SOLUTION
250.0000 mL | Freq: Once | INTRAVENOUS | Status: AC
  Administered 2020-02-05: 250 mL via INTRAVENOUS

## 2020-02-05 MED ORDER — HEPARIN SOD (PORK) LOCK FLUSH 100 UNIT/ML IV SOLN
500.0000 [IU] | Freq: Once | INTRAVENOUS | Status: AC | PRN
  Administered 2020-02-05: 500 [IU]

## 2020-02-05 MED ORDER — SODIUM CHLORIDE 0.9 % IV SOLN
75.0000 mg/m2 | Freq: Once | INTRAVENOUS | Status: AC
  Administered 2020-02-05: 170 mg via INTRAVENOUS
  Filled 2020-02-05: qty 17

## 2020-02-05 MED ORDER — SODIUM CHLORIDE 0.9 % IV SOLN: Freq: Once | INTRAVENOUS | Status: AC

## 2020-02-05 MED ORDER — PALONOSETRON HCL INJECTION 0.25 MG/5ML
0.2500 mg | Freq: Once | INTRAVENOUS | Status: AC
  Administered 2020-02-05: 0.25 mg via INTRAVENOUS
  Filled 2020-02-05: qty 5

## 2020-02-05 MED ORDER — SODIUM CHLORIDE 0.9% FLUSH
10.0000 mL | INTRAVENOUS | Status: DC | PRN
  Administered 2020-02-05: 10 mL

## 2020-02-05 NOTE — Progress Notes (Signed)
Hunter Smith presents today for blood transfusion as well as D1C2 Vidaza. Pt denies any new changes or symptoms since last treatment. Lab results, including hgb 6.7, and vitals have been reviewed and are stable and within parameters for treatment. Patient was seen and assessed by Dr. Delton Coombes yesterday and he has approved proceeding with treatment today as planned.  Infusions tolerated without incident or complaint. VSS upon completion of treatment. Port flushed and deaccessed per protocol, see MAR and IV flowsheet for details. Discharged in satisfactory condition with follow up instructions.

## 2020-02-05 NOTE — Patient Instructions (Signed)
[facility] Discharge Instructions for Patients Receiving Chemotherapy   Beginning January 23rd 2017 lab work for the [facility] will be done in the  Main lab at [facility] on 1st floor. If you have a lab appointment with the [facility] please come in thru the  Main Entrance and check in at the main information desk   Today you received the following chemotherapy agents Vidaza and a unit of blood  To help prevent nausea and vomiting after your treatment, we encourage you to take your nausea medication   If you develop nausea and vomiting, or diarrhea that is not controlled by your medication, call the clinic.  The clinic phone number is [phone]. Office hours are Monday-Friday 8:30am-5:00pm.  BELOW ARE SYMPTOMS THAT SHOULD BE REPORTED IMMEDIATELY:  *FEVER GREATER THAN 101.0 F  *CHILLS WITH OR WITHOUT FEVER  NAUSEA AND VOMITING THAT IS NOT CONTROLLED WITH YOUR NAUSEA MEDICATION  *UNUSUAL SHORTNESS OF BREATH  *UNUSUAL BRUISING OR BLEEDING  TENDERNESS IN MOUTH AND THROAT WITH OR WITHOUT PRESENCE OF ULCERS  *URINARY PROBLEMS  *BOWEL PROBLEMS  UNUSUAL RASH Items with * indicate a potential emergency and should be followed up as soon as possible. If you have an emergency after office hours please contact your primary care physician or go to the nearest emergency department.  Please call the clinic during office hours if you have any questions or concerns.   You may also contact the Patient Navigator at [phone] should you have any questions or need assistance in obtaining follow up care.      Resources For Cancer Patients and their Caregivers ? American Cancer Society: Can assist with transportation, wigs, general needs, runs Look Good Feel Better.        (318) 635-3216 ? Cancer Care: Provides financial assistance, online support groups, medication/co-pay assistance.  1-800-813-HOPE 865-028-1937) ? [place] Assists  [place] cancer patients and their families through emotional , educational and financial support.  (431)083-3533 ? [place] DSS Where to apply for food stamps, Medicaid and utility assistance. 352-004-9358 ? [facility]: Transportation to medical appointments. 304-813-2536 ? Social Security Administration: May apply for disability if have a Stage IV cancer. 628-470-5995 323-346-8587 ? [employer], Disability and Transit Services: Assists with nutrition, care and transit needs. ([phone] 069-6123

## 2020-02-06 ENCOUNTER — Other Ambulatory Visit: Payer: Self-pay

## 2020-02-06 ENCOUNTER — Encounter (HOSPITAL_COMMUNITY): Payer: Self-pay

## 2020-02-06 ENCOUNTER — Inpatient Hospital Stay (HOSPITAL_COMMUNITY): Payer: 59

## 2020-02-06 VITALS — BP 114/68 | HR 80 | Temp 97.3°F | Resp 18 | Wt 223.6 lb

## 2020-02-06 DIAGNOSIS — D46Z Other myelodysplastic syndromes: Secondary | ICD-10-CM

## 2020-02-06 DIAGNOSIS — Z95828 Presence of other vascular implants and grafts: Secondary | ICD-10-CM

## 2020-02-06 DIAGNOSIS — D469 Myelodysplastic syndrome, unspecified: Secondary | ICD-10-CM | POA: Diagnosis not present

## 2020-02-06 LAB — TYPE AND SCREEN
ABO/RH(D): O POS
Antibody Screen: NEGATIVE
Unit division: 0

## 2020-02-06 LAB — BPAM RBC
Blood Product Expiration Date: 202110112359
ISSUE DATE / TIME: 202109280917
Unit Type and Rh: 5100

## 2020-02-06 MED ORDER — HEPARIN SOD (PORK) LOCK FLUSH 100 UNIT/ML IV SOLN
500.0000 [IU] | Freq: Once | INTRAVENOUS | Status: AC | PRN
  Administered 2020-02-06: 500 [IU]

## 2020-02-06 MED ORDER — SODIUM CHLORIDE 0.9 % IV SOLN: Freq: Once | INTRAVENOUS | Status: AC

## 2020-02-06 MED ORDER — SODIUM CHLORIDE 0.9 % IV SOLN
75.0000 mg/m2 | Freq: Once | INTRAVENOUS | Status: AC
  Administered 2020-02-06: 170 mg via INTRAVENOUS
  Filled 2020-02-06: qty 17

## 2020-02-06 MED ORDER — SODIUM CHLORIDE 0.9 % IV SOLN
10.0000 mg | Freq: Once | INTRAVENOUS | Status: AC
  Administered 2020-02-06: 10 mg via INTRAVENOUS
  Filled 2020-02-06: qty 10

## 2020-02-06 MED ORDER — SODIUM CHLORIDE 0.9% FLUSH
10.0000 mL | INTRAVENOUS | Status: DC | PRN
  Administered 2020-02-06: 10 mL

## 2020-02-06 NOTE — Patient Instructions (Signed)
[facility] Discharge Instructions for Patients Receiving Chemotherapy  Today you received the following chemotherapy agents   To help prevent nausea and vomiting after your treatment, we encourage you to take your nausea medication   If you develop nausea and vomiting that is not controlled by your nausea medication, call the clinic.   BELOW ARE SYMPTOMS THAT SHOULD BE REPORTED IMMEDIATELY:  *FEVER GREATER THAN 100.5 F  *CHILLS WITH OR WITHOUT FEVER  NAUSEA AND VOMITING THAT IS NOT CONTROLLED WITH YOUR NAUSEA MEDICATION  *UNUSUAL SHORTNESS OF BREATH  *UNUSUAL BRUISING OR BLEEDING  TENDERNESS IN MOUTH AND THROAT WITH OR WITHOUT PRESENCE OF ULCERS  *URINARY PROBLEMS  *BOWEL PROBLEMS  UNUSUAL RASH Items with * indicate a potential emergency and should be followed up as soon as possible.  Feel free to call the clinic should you have any questions or concerns. The clinic phone number is [phone].  Please show the CHEMO ALERT CARD at check-in to the Emergency Department and triage nurse.   

## 2020-02-06 NOTE — Progress Notes (Signed)
Patient presents today for treatment. Vital signs within parameters for treatment. Patient denies pain today. Patient denies any changes since his last visit. MAR reviewed.   Treatment given today per MD orders. Tolerated infusion without adverse affects. Vital signs stable. No complaints at this time. Discharged from clinic ambulatory in stable condition. Alert and oriented x 3. F/U with [facility] as scheduled.

## 2020-02-07 ENCOUNTER — Inpatient Hospital Stay (HOSPITAL_COMMUNITY): Payer: 59

## 2020-02-07 VITALS — BP 119/70 | HR 71 | Temp 97.3°F | Resp 18

## 2020-02-07 DIAGNOSIS — D46Z Other myelodysplastic syndromes: Secondary | ICD-10-CM

## 2020-02-07 DIAGNOSIS — D469 Myelodysplastic syndrome, unspecified: Secondary | ICD-10-CM | POA: Diagnosis not present

## 2020-02-07 DIAGNOSIS — Z95828 Presence of other vascular implants and grafts: Secondary | ICD-10-CM

## 2020-02-07 MED ORDER — HEPARIN SOD (PORK) LOCK FLUSH 100 UNIT/ML IV SOLN
500.0000 [IU] | Freq: Once | INTRAVENOUS | Status: AC | PRN
  Administered 2020-02-07: 500 [IU]

## 2020-02-07 MED ORDER — SODIUM CHLORIDE 0.9 % IV SOLN
10.0000 mg | Freq: Once | INTRAVENOUS | Status: AC
  Administered 2020-02-07: 10 mg via INTRAVENOUS
  Filled 2020-02-07: qty 10

## 2020-02-07 MED ORDER — SODIUM CHLORIDE 0.9 % IV SOLN: Freq: Once | INTRAVENOUS | Status: AC

## 2020-02-07 MED ORDER — SODIUM CHLORIDE 0.9 % IV SOLN
75.0000 mg/m2 | Freq: Once | INTRAVENOUS | Status: AC
  Administered 2020-02-07: 170 mg via INTRAVENOUS
  Filled 2020-02-07: qty 17

## 2020-02-07 MED ORDER — SODIUM CHLORIDE 0.9% FLUSH
10.0000 mL | INTRAVENOUS | Status: DC | PRN
  Administered 2020-02-07: 10 mL

## 2020-02-07 MED ORDER — PALONOSETRON HCL INJECTION 0.25 MG/5ML
0.2500 mg | Freq: Once | INTRAVENOUS | Status: AC
  Administered 2020-02-07: 0.25 mg via INTRAVENOUS
  Filled 2020-02-07: qty 5

## 2020-02-07 NOTE — Progress Notes (Signed)
Patient presents today for treatment. Day 3. Vital signs stable. Patient has no complaints of any changes since his last treatment.   Treatment given today per MD orders. Tolerated infusion without adverse affects. Vital signs stable. No complaints at this time. Discharged from clinic ambulatory in stable condition. Alert and oriented x 3. F/U with [facility] as scheduled.

## 2020-02-07 NOTE — Patient Instructions (Signed)
[facility] Discharge Instructions for Patients Receiving Chemotherapy  Today you received the following chemotherapy agents   To help prevent nausea and vomiting after your treatment, we encourage you to take your nausea medication   If you develop nausea and vomiting that is not controlled by your nausea medication, call the clinic.   BELOW ARE SYMPTOMS THAT SHOULD BE REPORTED IMMEDIATELY:  *FEVER GREATER THAN 100.5 F  *CHILLS WITH OR WITHOUT FEVER  NAUSEA AND VOMITING THAT IS NOT CONTROLLED WITH YOUR NAUSEA MEDICATION  *UNUSUAL SHORTNESS OF BREATH  *UNUSUAL BRUISING OR BLEEDING  TENDERNESS IN MOUTH AND THROAT WITH OR WITHOUT PRESENCE OF ULCERS  *URINARY PROBLEMS  *BOWEL PROBLEMS  UNUSUAL RASH Items with * indicate a potential emergency and should be followed up as soon as possible.  Feel free to call the clinic should you have any questions or concerns. The clinic phone number is [phone].  Please show the CHEMO ALERT CARD at check-in to the Emergency Department and triage nurse.   

## 2020-02-08 ENCOUNTER — Inpatient Hospital Stay (HOSPITAL_COMMUNITY): Payer: 59 | Attending: Hematology

## 2020-02-08 ENCOUNTER — Other Ambulatory Visit: Payer: Self-pay

## 2020-02-08 VITALS — BP 127/65 | HR 75 | Temp 96.9°F | Resp 18

## 2020-02-08 DIAGNOSIS — D696 Thrombocytopenia, unspecified: Secondary | ICD-10-CM | POA: Insufficient documentation

## 2020-02-08 DIAGNOSIS — Z79899 Other long term (current) drug therapy: Secondary | ICD-10-CM | POA: Insufficient documentation

## 2020-02-08 DIAGNOSIS — Z5111 Encounter for antineoplastic chemotherapy: Secondary | ICD-10-CM | POA: Diagnosis not present

## 2020-02-08 DIAGNOSIS — Z23 Encounter for immunization: Secondary | ICD-10-CM | POA: Diagnosis not present

## 2020-02-08 DIAGNOSIS — Z95828 Presence of other vascular implants and grafts: Secondary | ICD-10-CM

## 2020-02-08 DIAGNOSIS — C9310 Chronic myelomonocytic leukemia not having achieved remission: Secondary | ICD-10-CM | POA: Diagnosis not present

## 2020-02-08 DIAGNOSIS — D469 Myelodysplastic syndrome, unspecified: Secondary | ICD-10-CM | POA: Insufficient documentation

## 2020-02-08 DIAGNOSIS — D539 Nutritional anemia, unspecified: Secondary | ICD-10-CM | POA: Insufficient documentation

## 2020-02-08 DIAGNOSIS — D46Z Other myelodysplastic syndromes: Secondary | ICD-10-CM

## 2020-02-08 MED ORDER — SODIUM CHLORIDE 0.9 % IV SOLN
10.0000 mg | Freq: Once | INTRAVENOUS | Status: AC
  Administered 2020-02-08: 10 mg via INTRAVENOUS
  Filled 2020-02-08: qty 10

## 2020-02-08 MED ORDER — SODIUM CHLORIDE 0.9% FLUSH: 10.0000 mL | INTRAVENOUS | Status: DC | PRN

## 2020-02-08 MED ORDER — SODIUM CHLORIDE 0.9 % IV SOLN: Freq: Once | INTRAVENOUS | Status: AC

## 2020-02-08 MED ORDER — HEPARIN SOD (PORK) LOCK FLUSH 100 UNIT/ML IV SOLN
500.0000 [IU] | Freq: Once | INTRAVENOUS | Status: AC | PRN
  Administered 2020-02-08: 500 [IU]

## 2020-02-08 MED ORDER — SODIUM CHLORIDE 0.9 % IV SOLN
75.0000 mg/m2 | Freq: Once | INTRAVENOUS | Status: AC
  Administered 2020-02-08: 170 mg via INTRAVENOUS
  Filled 2020-02-08: qty 17

## 2020-02-08 NOTE — Progress Notes (Signed)
Treatment given per orders. Patient tolerated it well without problems. Vitals stable and discharged home from clinic ambulatory in stable condition. Follow up as scheduled.  

## 2020-02-11 ENCOUNTER — Inpatient Hospital Stay (HOSPITAL_COMMUNITY): Payer: 59

## 2020-02-11 ENCOUNTER — Encounter (HOSPITAL_COMMUNITY): Payer: Self-pay | Admitting: Hematology

## 2020-02-11 ENCOUNTER — Encounter (HOSPITAL_COMMUNITY): Payer: Self-pay

## 2020-02-11 ENCOUNTER — Other Ambulatory Visit: Payer: Self-pay

## 2020-02-11 ENCOUNTER — Inpatient Hospital Stay (HOSPITAL_BASED_OUTPATIENT_CLINIC_OR_DEPARTMENT_OTHER): Payer: 59 | Admitting: Hematology

## 2020-02-11 VITALS — BP 122/74 | HR 77 | Temp 97.8°F | Resp 17

## 2020-02-11 VITALS — BP 129/77 | HR 93 | Temp 97.7°F | Resp 18 | Wt 225.7 lb

## 2020-02-11 DIAGNOSIS — C9310 Chronic myelomonocytic leukemia not having achieved remission: Secondary | ICD-10-CM | POA: Diagnosis not present

## 2020-02-11 DIAGNOSIS — D46Z Other myelodysplastic syndromes: Secondary | ICD-10-CM | POA: Diagnosis not present

## 2020-02-11 DIAGNOSIS — D696 Thrombocytopenia, unspecified: Secondary | ICD-10-CM | POA: Diagnosis not present

## 2020-02-11 DIAGNOSIS — D539 Nutritional anemia, unspecified: Secondary | ICD-10-CM | POA: Diagnosis not present

## 2020-02-11 DIAGNOSIS — Z95828 Presence of other vascular implants and grafts: Secondary | ICD-10-CM

## 2020-02-11 LAB — CBC WITH DIFFERENTIAL/PLATELET
Band Neutrophils: 2 %
Basophils Absolute: 0 10*3/uL (ref 0.0–0.1)
Basophils Relative: 0 %
Blasts: 4 %
Eosinophils Absolute: 0.5 10*3/uL (ref 0.0–0.5)
Eosinophils Relative: 5 %
HCT: 20.6 % — ABNORMAL LOW (ref 39.0–52.0)
Hemoglobin: 6.7 g/dL — CL (ref 13.0–17.0)
Lymphocytes Relative: 38 %
Lymphs Abs: 3.6 10*3/uL (ref 0.7–4.0)
MCH: 32.7 pg (ref 26.0–34.0)
MCHC: 32.5 g/dL (ref 30.0–36.0)
MCV: 100.5 fL — ABNORMAL HIGH (ref 80.0–100.0)
Metamyelocytes Relative: 10 %
Monocytes Absolute: 2 10*3/uL — ABNORMAL HIGH (ref 0.1–1.0)
Monocytes Relative: 21 %
Myelocytes: 4 %
Neutro Abs: 1.1 10*3/uL — ABNORMAL LOW (ref 1.7–7.7)
Neutrophils Relative %: 9 %
Platelets: 17 10*3/uL — CL (ref 150–400)
Promyelocytes Relative: 7 %
RBC: 2.05 MIL/uL — ABNORMAL LOW (ref 4.22–5.81)
RDW: 19.7 % — ABNORMAL HIGH (ref 11.5–15.5)
WBC: 9.6 10*3/uL (ref 4.0–10.5)
nRBC: 0 % (ref 0.0–0.2)

## 2020-02-11 LAB — PREPARE RBC (CROSSMATCH)

## 2020-02-11 LAB — COMPREHENSIVE METABOLIC PANEL
ALT: 31 U/L (ref 0–44)
AST: 20 U/L (ref 15–41)
Albumin: 3.5 g/dL (ref 3.5–5.0)
Alkaline Phosphatase: 38 U/L (ref 38–126)
Anion gap: 9 (ref 5–15)
BUN: 22 mg/dL — ABNORMAL HIGH (ref 6–20)
CO2: 26 mmol/L (ref 22–32)
Calcium: 8.9 mg/dL (ref 8.9–10.3)
Chloride: 102 mmol/L (ref 98–111)
Creatinine, Ser: 1.01 mg/dL (ref 0.61–1.24)
GFR calc Af Amer: >60 mL/min (ref >60)
GFR calc non Af Amer: >60 mL/min (ref >60)
Glucose, Bld: 102 mg/dL — ABNORMAL HIGH (ref 70–99)
Potassium: 4 mmol/L (ref 3.5–5.1)
Sodium: 137 mmol/L (ref 135–145)
Total Bilirubin: 0.6 mg/dL (ref 0.3–1.2)
Total Protein: 5.9 g/dL — ABNORMAL LOW (ref 6.5–8.1)

## 2020-02-11 LAB — LACTATE DEHYDROGENASE: LDH: 181 U/L (ref 98–192)

## 2020-02-11 MED ORDER — SODIUM CHLORIDE 0.9% FLUSH: 10.0000 mL | INTRAVENOUS | Status: DC | PRN

## 2020-02-11 MED ORDER — PALONOSETRON HCL INJECTION 0.25 MG/5ML
0.2500 mg | Freq: Once | INTRAVENOUS | Status: AC
  Administered 2020-02-11: 0.25 mg via INTRAVENOUS

## 2020-02-11 MED ORDER — PALONOSETRON HCL INJECTION 0.25 MG/5ML
INTRAVENOUS | Status: AC
  Filled 2020-02-11: qty 5

## 2020-02-11 MED ORDER — SODIUM CHLORIDE 0.9 % IV SOLN: Freq: Once | INTRAVENOUS | Status: AC

## 2020-02-11 MED ORDER — SODIUM CHLORIDE 0.9 % IV SOLN
75.0000 mg/m2 | Freq: Once | INTRAVENOUS | Status: AC
  Administered 2020-02-11: 170 mg via INTRAVENOUS
  Filled 2020-02-11: qty 17

## 2020-02-11 MED ORDER — HEPARIN SOD (PORK) LOCK FLUSH 100 UNIT/ML IV SOLN: 500.0000 [IU] | Freq: Once | INTRAVENOUS | Status: DC | PRN

## 2020-02-11 MED ORDER — ACETAMINOPHEN 325 MG PO TABS: 650.0000 mg | ORAL_TABLET | Freq: Once | ORAL | Status: DC

## 2020-02-11 MED ORDER — DIPHENHYDRAMINE HCL 25 MG PO CAPS: 25.0000 mg | ORAL_CAPSULE | Freq: Once | ORAL | Status: DC

## 2020-02-11 MED ORDER — SODIUM CHLORIDE 0.9% IV SOLUTION
250.0000 mL | Freq: Once | INTRAVENOUS | Status: AC
  Administered 2020-02-11: 250 mL via INTRAVENOUS

## 2020-02-11 MED ORDER — PEGFILGRASTIM-CBQV 6 MG/0.6ML ~~LOC~~ SOSY
PREFILLED_SYRINGE | SUBCUTANEOUS | Status: AC
  Filled 2020-02-11: qty 0.6

## 2020-02-11 MED ORDER — SODIUM CHLORIDE 0.9 % IV SOLN
10.0000 mg | Freq: Once | INTRAVENOUS | Status: AC
  Administered 2020-02-11: 10 mg via INTRAVENOUS
  Filled 2020-02-11: qty 10

## 2020-02-11 MED ORDER — HEPARIN SOD (PORK) LOCK FLUSH 100 UNIT/ML IV SOLN
500.0000 [IU] | Freq: Every day | INTRAVENOUS | Status: AC | PRN
  Administered 2020-02-11: 500 [IU]

## 2020-02-11 NOTE — Patient Instructions (Signed)
[place] at [facility] Discharge Instructions  You were seen today by Dr. Delton Coombes. He went over your recent results. You received your treatment today; continue daily treatments this week. Dr. Delton Coombes will see you back in 3 weeks for labs and follow up.   Thank you for choosing [facility] at [facility] to provide your oncology and hematology care.  To afford each patient quality time with our provider, please arrive at least 15 minutes before your scheduled appointment time.   If you have a lab appointment with the [facility] please come in thru the Main Entrance and check in at the main information desk  You need to re-schedule your appointment should you arrive 10 or more minutes late.  We strive to give you quality time with our providers, and arriving late affects you and other patients whose appointments are after yours.  Also, if you no show three or more times for appointments you may be dismissed from the clinic at the providers discretion.     Again, thank you for choosing [facility].  Our hope is that these requests will decrease the amount of time that you wait before being seen by our physicians.       _____________________________________________________________  Should you have questions after your visit to [facility], please contact our office at [phone] between the hours of 8:00 a.m. and 4:30 p.m.  Voicemails left after 4:00 p.m. will not be returned until the following business day.  For prescription refill requests, have your pharmacy contact our office and allow 72 hours.    Cancer Center Support Programs:   > Cancer Support Group  2nd Tuesday of the month 1pm-2pm, Journey Room

## 2020-02-11 NOTE — Progress Notes (Signed)
Patient presents today for treatment and follow up visit with Dr. Delton Coombes. Labs pending. Patient has no complaints of pain. Patient denies any changes since his last visit. MAR reviewed and updated  Verbal order received from [place] RN/ Dr. Delton Coombes to proceed with treatment without cbc/d results.   Hgb 6.7 today. Verbal order received from [place] RN/ Dr. Delton Coombes infuse 1 Unit of PRBC's irradiated today. Parameters for platelets infuse only if platelets are less than 15 thousand per MD.   Treatment given today per MD orders. 1 Unit of blood infused today. Tolerated infusion without adverse affects. Vital signs stable. No complaints at this time. Discharged from clinic ambulatory in stable condition. Alert and oriented x 3. F/U with [facility] as scheduled.

## 2020-02-11 NOTE — Progress Notes (Signed)
Hunter Smith, Hunter Smith [id number]   CLINIC:  Medical Oncology/Hematology  PCP:  Celene Squibb, MD [address] Liana Crocker [place] [address] [phone]   REASON FOR VISIT:  Follow-up for MDS, macrocytic anemia and thrombocytopenia  PRIOR THERAPY: None  NGS Results: Not done  CURRENT THERAPY: Azacitidine every 3 weeks  BRIEF ONCOLOGIC HISTORY:  Oncology History  Myelodysplastic syndrome, high grade ([place])  12/28/2019 Initial Diagnosis   MDS (myelodysplastic syndrome), high grade ([place])   12/31/2019 -  Chemotherapy   The patient had palonosetron (ALOXI) injection 0.25 mg, 0.25 mg, Intravenous,  Once, 2 of 4 cycles Administration: 0.25 mg (01/02/2020), 0.25 mg (01/04/2020), 0.25 mg (01/07/2020), 0.25 mg (02/05/2020), 0.25 mg (02/07/2020) azaCITIDine (VIDAZA) 170 mg in sodium chloride 0.9 % 50 mL chemo infusion, 75 mg/m2 = 170 mg, Intravenous, Once, 2 of 4 cycles Administration: 170 mg (12/31/2019), 170 mg (01/01/2020), 170 mg (01/02/2020), 170 mg (01/03/2020), 170 mg (01/04/2020), 170 mg (01/07/2020), 170 mg (01/08/2020), 170 mg (02/05/2020), 170 mg (02/06/2020), 170 mg (02/07/2020), 170 mg (02/08/2020)  for chemotherapy treatment.      CANCER STAGING: Cancer Staging No matching staging information was found for the patient.  INTERVAL HISTORY:  [name], a 59 y.o. male, returns for routine follow-up and consideration for next cycle of chemotherapy. Hunter Smith was last seen on 02/04/2020.  Due for day #5 of cycle #2 of azacitidine today.   Today he is accompanied by his wife. Overall, he tells me he has been feeling pretty well. He tolerated the previous treatment well. He denies having any easy bruising or bleeding and his appetite and energy levels have improved. He denies ankle swelling, CP or light-headedness, though he gets SOB when he exerts himself. He drinks plenty of fluids and is trying to eat a clean diet.  He is able to do his ADL's and chores  at home.  Overall, he feels ready for next cycle of chemo today.    REVIEW OF SYSTEMS:  Review of Systems  Constitutional: Negative for appetite change and fatigue.  Respiratory: Positive for shortness of breath (w/ exertion).   Cardiovascular: Negative for chest pain and leg swelling.  Neurological: Negative for light-headedness.  Hematological: Does not bruise/bleed easily.  All other systems reviewed and are negative.   PAST MEDICAL/SURGICAL HISTORY:  Past Medical History:  Diagnosis Date  . Port-A-Cath in place 12/28/2019   Past Surgical History:  Procedure Laterality Date  . COLONOSCOPY    . PORTACATH PLACEMENT Right 01/18/2020   Procedure: INSERTION PORT-A-CATH;  Surgeon: Aviva Signs, MD;  Location: [facility];  Service: General;  Laterality: Right;    SOCIAL HISTORY:  Social History   Socioeconomic History  . Marital status: Married    Spouse name: Not on file  . Number of children: Not on file  . Years of education: Not on file  . Highest education level: Not on file  Occupational History  . Occupation: Employed  Tobacco Use  . Smoking status: Former Smoker    Types: Cigarettes    Quit date: 05/03/1988    Years since quitting: 31.7  . Smokeless tobacco: Never Used  Substance and Sexual Activity  . Alcohol use: Yes    Alcohol/week: 1.0 - 2.0 standard drink    Types: 1 - 2 Standard drinks or equivalent per week  . Drug use: Never  . Sexual activity: Yes  Other Topics Concern  . Not on file  Social History Narrative  .  Not on file   Social Determinants of Health   Financial Resource Strain: Low Risk   . Difficulty of Paying Living Expenses: Not hard at all  Food Insecurity: No Food Insecurity  . Worried About Charity fundraiser in the Last Year: Never true  . Ran Out of Food in the Last Year: Never true  Transportation Needs: No Transportation Needs  . Lack of Transportation (Medical): No  . Lack of Transportation (Non-Medical): No  Physical  Activity:   . Days of Exercise per Week: Not on file  . Minutes of Exercise per Session: Not on file  Stress:   . Feeling of Stress : Not on file  Social Connections: Unknown  . Frequency of Communication with Friends and Family: More than three times a week  . Frequency of Social Gatherings with Friends and Family: More than three times a week  . Attends Religious Services: Not on file  . Active Member of Clubs or Organizations: Not on file  . Attends Archivist Meetings: Not on file  . Marital Status: Married  Human resources officer Violence:   . Fear of Current or Ex-Partner: Not on file  . Emotionally Abused: Not on file  . Physically Abused: Not on file  . Sexually Abused: Not on file    FAMILY HISTORY:  Family History  Problem Relation Age of Onset  . Bladder Cancer Father   . Scoliosis Sister     CURRENT MEDICATIONS:  Current Outpatient Medications  Medication Sig Dispense Refill  . azaCITIDine 5 mg/2 mLs in lactated ringers infusion Inject into the vein daily. Days 1-7 every 28 days    . docusate sodium (COLACE) 100 MG capsule Take 100 mg by mouth 2 (two) times daily.    Marland Kitchen HYDROcodone-acetaminophen (NORCO) 5-325 MG tablet Take 1 tablet by mouth every 4 (four) hours as needed for moderate pain. 20 tablet 0  . lidocaine-prilocaine (EMLA) cream Apply a small amount to port a cath site and cover with plastic wrap 1 hour prior to chemotherapy appointments 30 g 3  . Multiple Vitamin (MULTIVITAMIN) tablet Take 1 tablet by mouth daily.    . prochlorperazine (COMPAZINE) 10 MG tablet Take 1 tablet (10 mg total) by mouth every 6 (six) hours as needed (Nausea or vomiting). 30 tablet 1   No current facility-administered medications for this visit.    ALLERGIES:  No Known Allergies  PHYSICAL EXAM:  Performance status (ECOG): 1 - Symptomatic but completely ambulatory  Vitals:   02/11/20 0808  BP: 129/77  Pulse: 93  Resp: 18  Temp: 97.7 F (36.5 C)  SpO2: 96%   Wt  Readings from Last 3 Encounters:  02/11/20 225 lb 11.2 oz (102.4 kg)  02/06/20 223 lb 9.6 oz (101.4 kg)  02/04/20 222 lb 9.6 oz (101 kg)   Physical Exam Vitals reviewed.  Constitutional:      Appearance: Normal appearance. He is obese.  Cardiovascular:     Rate and Rhythm: Normal rate and regular rhythm.     Pulses: Normal pulses.     Heart sounds: Normal heart sounds.  Pulmonary:     Effort: Pulmonary effort is normal.     Breath sounds: Normal breath sounds.  Chest:     Comments: Port-a-Cath in R chest Abdominal:     Palpations: Abdomen is soft. There is no hepatomegaly or mass.     Tenderness: There is no abdominal tenderness.  Musculoskeletal:     Right lower leg: No edema.  Left lower leg: No edema.  Neurological:     General: No focal deficit present.     Mental Status: He is alert and oriented to person, place, and time.  Psychiatric:        Mood and Affect: Mood normal.        Behavior: Behavior normal.     LABORATORY DATA:  I have reviewed the labs as listed.  CBC Latest Ref Rng & Units 02/04/2020 01/24/2020 01/21/2020  WBC 4.0 - 10.5 K/uL 27.5(H) 45.5(H) 32.0(H)  Hemoglobin 13.0 - 17.0 g/dL 6.7(LL) 8.2(L) 8.4(L)  Hematocrit 39 - 52 % 20.8(L) 24.7(L) 25.5(L)  Platelets 150 - 400 K/uL 21(LL) 21(LL) 23(LL)   CMP Latest Ref Rng & Units 02/11/2020 02/04/2020 01/24/2020  Glucose 70 - 99 mg/dL 102(H) 117(H) 108(H)  BUN 6 - 20 mg/dL 22(H) 18 19  Creatinine 0.61 - 1.24 mg/dL 1.01 1.13 1.08  Sodium 135 - 145 mmol/L 137 138 137  Potassium 3.5 - 5.1 mmol/L 4.0 3.9 3.5  Chloride 98 - 111 mmol/L 102 104 104  CO2 22 - 32 mmol/L 26 25 25   Calcium 8.9 - 10.3 mg/dL 8.9 8.8(L) 9.4  Total Protein 6.5 - 8.1 g/dL 5.9(L) 6.6 7.1  Total Bilirubin 0.3 - 1.2 mg/dL 0.6 0.6 0.7  Alkaline Phos 38 - 126 U/L 38 44 48  AST 15 - 41 U/L 20 24 34  ALT 0 - 44 U/L 31 26 30    Lab Results  Component Value Date   LDH 181 02/11/2020   LDH 562 (H) 01/24/2020   LDH 311 (H) 12/12/2019     DIAGNOSTIC IMAGING:  I have independently reviewed the scans and discussed with the patient. DG Chest Port 1 View  Result Date: 01/18/2020 CLINICAL DATA:  59 year old male Port-A-Cath placement. Myelodysplastic syndrome. EXAM: PORTABLE CHEST 1 VIEW COMPARISON:  None. FINDINGS: Portable AP upright view at 1128 hours. Right chest subclavian approach power port placed. Catheter tip is at the level of the carina, SVC. No adverse features identified. The patient is rotated to the right. No pneumothorax. Mediastinal contours within normal limits. Visualized tracheal air column is within normal limits. Allowing for portable technique the lungs are clear. Visible osseous structures within normal limits. IMPRESSION: Right chest power port placed. No acute cardiopulmonary abnormality. Electronically Signed   By: Genevie Ann M.D.   On: 01/18/2020 11:48   DG C-Arm 1-60 Min-No Report  Result Date: 01/18/2020 Fluoroscopy was utilized by the requesting physician.  No radiographic interpretation.     ASSESSMENT:  1.High-grade MDS with excess blasts/CMML: -Presentation with pancytopenia and transfusion dependent anemia. -Bone marrow biopsy on 12/14/2019 at [facility] showed hypercellular marrow (90%) with dyserythropoiesis, dysgranulopoiesis and 16% blasts. Findings consistent with MDS-EB 2. FLT3 ITD/TKD negative. -Chromosome analysis 47, XY, +8[19]/46, XY[1]. -MDS FISH panel pending. -However his last 2 CBC showing increased white count of 14-15 K with peripheral blood monocytosis, also likely CMML. -He was evaluated by Dr. Jerrye Noble at [facility]. Bone marrow biopsy after cycle 2 at [facility] recommended. -Cycle 1 of azacitidine (5+2) on 12/31/2019. 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  Current biopsy shows similar hypercellular marrow with myeloid and erythroid dysplasia and blast fraction has decreased from 16% to 5-10%. -New plan is to repeat bone marrow biopsy  after cycle 3.   PLAN:  1.High-grade MDS/CMML: -We reviewed bone marrow biopsy results. -Cycle 2 was started on 02/05/2020. -He is tolerating it very well without any major side effects.  Reviewed  labs from today which showed hemoglobin 6.7 and platelet count 17.  Will transfuse 1 unit PRBC.  We will continue to monitor his labs on a weekly basis.  White count is 9.6.  2. Thrombocytopenia: -No bleeding issues.  Platelet count is 17.  3. Macrocytic anemia: -Hemoglobin is 6.7.  Will transfuse 1 unit of irradiated PRBC.   Orders placed this encounter:  No orders of the defined types were placed in this encounter.    Derek Jack, MD [place] [phone]   I, Milinda Antis, am acting as a scribe for Dr. Sanda Linger.  I, Derek Jack MD, have reviewed the above documentation for accuracy and completeness, and I agree with the above.

## 2020-02-11 NOTE — Patient Instructions (Signed)
[facility] at [facility]  Discharge Instructions:   _______________________________________________________________  Thank you for choosing [facility] at [facility] to provide your oncology and hematology care.  To afford each patient quality time with our providers, please arrive at least 15 minutes before your scheduled appointment.  You need to re-schedule your appointment if you arrive 10 or more minutes late.  We strive to give you quality time with our providers, and arriving late affects you and other patients whose appointments are after yours.  Also, if you no show three or more times for appointments you may be dismissed from the clinic.  Again, thank you for choosing  Cancer Center at [facility]. Our hope is that these requests will allow you access to exceptional care and in a timely manner. _______________________________________________________________  If you have questions after your visit, please contact our office at [phone] between the hours of 8:30 a.m. and 5:00 p.m. Voicemails left after 4:30 p.m. will not be returned until the following business day. _______________________________________________________________  For prescription refill requests, have your pharmacy contact our office. _______________________________________________________________  Recommendations made by the consultant and any test results will be sent to your referring physician. _______________________________________________________________ 

## 2020-02-11 NOTE — Patient Instructions (Signed)
[facility] at [facility] Discharge Instructions  Labs drawn from portacath today   Thank you for choosing [facility] at [facility] to provide your oncology and hematology care.  To afford each patient quality time with our provider, please arrive at least 15 minutes before your scheduled appointment time.   If you have a lab appointment with the Cancer Center please come in thru the Main Entrance and check in at the main information desk.  You need to re-schedule your appointment should you arrive 10 or more minutes late.  We strive to give you quality time with our providers, and arriving late affects you and other patients whose appointments are after yours.  Also, if you no show three or more times for appointments you may be dismissed from the clinic at the providers discretion.     Again, thank you for choosing [facility].  Our hope is that these requests will decrease the amount of time that you wait before being seen by our physicians.       _____________________________________________________________  Should you have questions after your visit to [facility], please contact our office at [phone] and follow the prompts.  Our office hours are 8:00 a.m. and 4:30 p.m. Monday - Friday.  Please note that voicemails left after 4:00 p.m. may not be returned until the following business day.  We are closed weekends and major holidays.  You do have access to a nurse 24-7, just call the main number to the clinic [phone] and do not press any options, hold on the line and a nurse will answer the phone.    For prescription refill requests, have your pharmacy contact our office and allow 72 hours.    Due to Covid, you will need to wear a mask upon entering the hospital. If you do not have a mask, a mask will be given to you at the Main Entrance upon arrival. For doctor visits, patients may have 1 support person age 18  or older with them. For treatment visits, patients can not have anyone with them due to social distancing guidelines and our immunocompromised population.     

## 2020-02-12 ENCOUNTER — Inpatient Hospital Stay (HOSPITAL_COMMUNITY): Payer: 59

## 2020-02-12 VITALS — BP 113/63 | HR 65 | Temp 97.5°F | Resp 18

## 2020-02-12 DIAGNOSIS — D46Z Other myelodysplastic syndromes: Secondary | ICD-10-CM

## 2020-02-12 DIAGNOSIS — Z95828 Presence of other vascular implants and grafts: Secondary | ICD-10-CM

## 2020-02-12 DIAGNOSIS — C9310 Chronic myelomonocytic leukemia not having achieved remission: Secondary | ICD-10-CM | POA: Diagnosis not present

## 2020-02-12 LAB — TYPE AND SCREEN
ABO/RH(D): O POS
Antibody Screen: NEGATIVE
Unit division: 0

## 2020-02-12 LAB — BPAM RBC
Blood Product Expiration Date: 202110272359
ISSUE DATE / TIME: 202110041134
Unit Type and Rh: 5100

## 2020-02-12 MED ORDER — SODIUM CHLORIDE 0.9 % IV SOLN
75.0000 mg/m2 | Freq: Once | INTRAVENOUS | Status: AC
  Administered 2020-02-12: 170 mg via INTRAVENOUS
  Filled 2020-02-12: qty 17

## 2020-02-12 MED ORDER — HEPARIN SOD (PORK) LOCK FLUSH 100 UNIT/ML IV SOLN
500.0000 [IU] | Freq: Once | INTRAVENOUS | Status: AC | PRN
  Administered 2020-02-12: 500 [IU]

## 2020-02-12 MED ORDER — SODIUM CHLORIDE 0.9 % IV SOLN: Freq: Once | INTRAVENOUS | Status: AC

## 2020-02-12 MED ORDER — SODIUM CHLORIDE 0.9 % IV SOLN
10.0000 mg | Freq: Once | INTRAVENOUS | Status: AC
  Administered 2020-02-12: 10 mg via INTRAVENOUS
  Filled 2020-02-12: qty 10

## 2020-02-12 NOTE — Progress Notes (Signed)
Tolerated infusion w/o adverse reaction.  Alert, in no distress.  VSS.  Discharged ambulatory in stable condition.  

## 2020-02-12 NOTE — Patient Instructions (Signed)
[facility] Discharge Instructions for Patients Receiving Chemotherapy   Beginning January 23rd 2017 lab work for the [facility] will be done in the  Main lab at [facility] on 1st floor. If you have a lab appointment with the [facility] please come in thru the  Main Entrance and check in at the main information desk   Today you received the following chemotherapy agents:  Vidzaza.   To help prevent nausea and vomiting after your treatment, we encourage you to take your nausea medication Compazine 10 mg by mouth every 6 hours as needed.  Begin taking it at the first sign of nausea and take it as often as prescribed as needed.   If you develop nausea and vomiting, or diarrhea that is not controlled by your medication, call the clinic.  The clinic phone number is [phone]. Office hours are Monday-Friday 8:30am-5:00pm.  BELOW ARE SYMPTOMS THAT SHOULD BE REPORTED IMMEDIATELY:  *FEVER GREATER THAN 101.0 F  *CHILLS WITH OR WITHOUT FEVER  NAUSEA AND VOMITING THAT IS NOT CONTROLLED WITH YOUR NAUSEA MEDICATION  *UNUSUAL SHORTNESS OF BREATH  *UNUSUAL BRUISING OR BLEEDING  TENDERNESS IN MOUTH AND THROAT WITH OR WITHOUT PRESENCE OF ULCERS  *URINARY PROBLEMS  *BOWEL PROBLEMS  UNUSUAL RASH Items with * indicate a potential emergency and should be followed up as soon as possible. If you have an emergency after office hours please contact your primary care physician or go to the nearest emergency department.  Please call the clinic during office hours if you have any questions or concerns.   You may also contact the Patient Navigator at [phone] should you have any questions or need assistance in obtaining follow up care.      Resources For Cancer Patients and their Caregivers ? American Cancer Society: Can assist with transportation, wigs, general needs, runs Look Good Feel Better.        319-491-2194 ? Cancer Care: Provides financial assistance,  online support groups, medication/co-pay assistance.  1-800-813-HOPE (203) 271-1732) ? [place] Assists [place] cancer patients and their families through emotional , educational and financial support.  780-338-3741 ? [place] DSS Where to apply for food stamps, Medicaid and utility assistance. (318)199-4459 ? [facility]: Transportation to medical appointments. (519)793-1154 ? Social Security Administration: May apply for disability if have a Stage IV cancer. 726 608 6099 (561) 643-8807 ? [employer], Disability and Transit Services: Assists with nutrition, care and transit needs. 513-855-9340

## 2020-02-13 ENCOUNTER — Other Ambulatory Visit: Payer: Self-pay

## 2020-02-13 ENCOUNTER — Inpatient Hospital Stay (HOSPITAL_COMMUNITY): Payer: 59

## 2020-02-13 VITALS — BP 113/63 | HR 64 | Temp 96.8°F | Resp 18

## 2020-02-13 DIAGNOSIS — Z95828 Presence of other vascular implants and grafts: Secondary | ICD-10-CM

## 2020-02-13 DIAGNOSIS — D46Z Other myelodysplastic syndromes: Secondary | ICD-10-CM

## 2020-02-13 DIAGNOSIS — C9310 Chronic myelomonocytic leukemia not having achieved remission: Secondary | ICD-10-CM | POA: Diagnosis not present

## 2020-02-13 MED ORDER — SODIUM CHLORIDE 0.9 % IV SOLN
10.0000 mg | Freq: Once | INTRAVENOUS | Status: AC
  Administered 2020-02-13: 10 mg via INTRAVENOUS
  Filled 2020-02-13: qty 10

## 2020-02-13 MED ORDER — SODIUM CHLORIDE 0.9 % IV SOLN
75.0000 mg/m2 | Freq: Once | INTRAVENOUS | Status: AC
  Administered 2020-02-13: 170 mg via INTRAVENOUS
  Filled 2020-02-13: qty 17

## 2020-02-13 MED ORDER — PALONOSETRON HCL INJECTION 0.25 MG/5ML
INTRAVENOUS | Status: AC
  Filled 2020-02-13: qty 5

## 2020-02-13 MED ORDER — SODIUM CHLORIDE 0.9 % IV SOLN: Freq: Once | INTRAVENOUS | Status: AC

## 2020-02-13 MED ORDER — PALONOSETRON HCL INJECTION 0.25 MG/5ML
0.2500 mg | Freq: Once | INTRAVENOUS | Status: AC
  Administered 2020-02-13: 0.25 mg via INTRAVENOUS

## 2020-02-13 MED ORDER — HEPARIN SOD (PORK) LOCK FLUSH 100 UNIT/ML IV SOLN
500.0000 [IU] | Freq: Once | INTRAVENOUS | Status: AC | PRN
  Administered 2020-02-13: 500 [IU]

## 2020-02-13 NOTE — Patient Instructions (Signed)
[facility] Discharge Instructions for Patients Receiving Chemotherapy   Beginning January 23rd 2017 lab work for the [facility] will be done in the  Main lab at [facility] on 1st floor. If you have a lab appointment with the [facility] please come in thru the  Main Entrance and check in at the main information desk   Today you received the following chemotherapy agents:  Vidaza  To help prevent nausea and vomiting after your treatment, we encourage you to take your nausea medication as prescribed.   If you develop nausea and vomiting, or diarrhea that is not controlled by your medication, call the clinic.  The clinic phone number is [phone]. Office hours are Monday-Friday 8:30am-5:00pm.  BELOW ARE SYMPTOMS THAT SHOULD BE REPORTED IMMEDIATELY:  *FEVER GREATER THAN 101.0 F  *CHILLS WITH OR WITHOUT FEVER  NAUSEA AND VOMITING THAT IS NOT CONTROLLED WITH YOUR NAUSEA MEDICATION  *UNUSUAL SHORTNESS OF BREATH  *UNUSUAL BRUISING OR BLEEDING  TENDERNESS IN MOUTH AND THROAT WITH OR WITHOUT PRESENCE OF ULCERS  *URINARY PROBLEMS  *BOWEL PROBLEMS  UNUSUAL RASH Items with * indicate a potential emergency and should be followed up as soon as possible. If you have an emergency after office hours please contact your primary care physician or go to the nearest emergency department.  Please call the clinic during office hours if you have any questions or concerns.   You may also contact the Patient Navigator at [phone] should you have any questions or need assistance in obtaining follow up care.      Resources For Cancer Patients and their Caregivers ? American Cancer Society: Can assist with transportation, wigs, general needs, runs Look Good Feel Better.        309-516-4848 ? Cancer Care: Provides financial assistance, online support groups, medication/co-pay assistance.  1-800-813-HOPE (904)553-8145) ? [place] Assists  [place] cancer patients and their families through emotional , educational and financial support.  (978) 753-7132 ? [place] DSS Where to apply for food stamps, Medicaid and utility assistance. 431-597-4576 ? [facility]: Transportation to medical appointments. (310)294-0097 ? Social Security Administration: May apply for disability if have a Stage IV cancer. 787 112 2028 310 619 2007 ? [employer], Disability and Transit Services: Assists with nutrition, care and transit needs. 409-411-4361

## 2020-02-13 NOTE — Progress Notes (Signed)
Tolerated infusion w/o adverse reaction.  Alert, in no distress.  VSS.  Discharged ambulatory in stable condition,.

## 2020-02-14 ENCOUNTER — Encounter (HOSPITAL_COMMUNITY): Payer: Self-pay

## 2020-02-15 ENCOUNTER — Encounter (HOSPITAL_COMMUNITY): Payer: Self-pay | Admitting: *Deleted

## 2020-02-15 ENCOUNTER — Other Ambulatory Visit: Payer: Self-pay

## 2020-02-15 DIAGNOSIS — D469 Myelodysplastic syndrome, unspecified: Secondary | ICD-10-CM | POA: Insufficient documentation

## 2020-02-15 DIAGNOSIS — Z87891 Personal history of nicotine dependence: Secondary | ICD-10-CM | POA: Insufficient documentation

## 2020-02-15 DIAGNOSIS — Z20822 Contact with and (suspected) exposure to covid-19: Secondary | ICD-10-CM | POA: Diagnosis not present

## 2020-02-15 DIAGNOSIS — R509 Fever, unspecified: Secondary | ICD-10-CM | POA: Insufficient documentation

## 2020-02-15 NOTE — Progress Notes (Signed)
Paperwork faxed to [facility]: Lilia Pro [phone]

## 2020-02-16 ENCOUNTER — Emergency Department (HOSPITAL_COMMUNITY)
Admission: EM | Admit: 2020-02-16 | Discharge: 2020-02-16 | Disposition: A | Discharge status: Home / Self Care | Payer: 59 | Attending: Emergency Medicine | Admitting: Emergency Medicine

## 2020-02-16 ENCOUNTER — Encounter (HOSPITAL_COMMUNITY): Payer: Self-pay | Admitting: Emergency Medicine

## 2020-02-16 ENCOUNTER — Other Ambulatory Visit: Payer: Self-pay

## 2020-02-16 ENCOUNTER — Emergency Department (HOSPITAL_COMMUNITY): Payer: 59

## 2020-02-16 DIAGNOSIS — R509 Fever, unspecified: Secondary | ICD-10-CM

## 2020-02-16 HISTORY — DX: Malignant (primary) neoplasm, unspecified: C80.1

## 2020-02-16 LAB — URINALYSIS, ROUTINE W REFLEX MICROSCOPIC
Bacteria, UA: NONE SEEN
Bilirubin Urine: NEGATIVE
Glucose, UA: NEGATIVE mg/dL
Ketones, ur: NEGATIVE mg/dL
Leukocytes,Ua: NEGATIVE
Nitrite: NEGATIVE
Protein, ur: NEGATIVE mg/dL
Specific Gravity, Urine: 1.011 (ref 1.005–1.030)
pH: 5 (ref 5.0–8.0)

## 2020-02-16 LAB — CBC WITH DIFFERENTIAL/PLATELET
Abs Immature Granulocytes: 0.15 10*3/uL — ABNORMAL HIGH (ref 0.00–0.07)
Basophils Absolute: 0 10*3/uL (ref 0.0–0.1)
Basophils Relative: 0 %
Eosinophils Absolute: 0 10*3/uL (ref 0.0–0.5)
Eosinophils Relative: 0 %
HCT: 20.7 % — ABNORMAL LOW (ref 39.0–52.0)
Hemoglobin: 7 g/dL — ABNORMAL LOW (ref 13.0–17.0)
Immature Granulocytes: 2 %
Lymphocytes Relative: 20 %
Lymphs Abs: 1.4 10*3/uL (ref 0.7–4.0)
MCH: 32.7 pg (ref 26.0–34.0)
MCHC: 33.8 g/dL (ref 30.0–36.0)
MCV: 96.7 fL (ref 80.0–100.0)
Monocytes Absolute: 4 10*3/uL — ABNORMAL HIGH (ref 0.1–1.0)
Monocytes Relative: 56 %
Neutro Abs: 1.6 10*3/uL — ABNORMAL LOW (ref 1.7–7.7)
Neutrophils Relative %: 22 %
Platelets: 16 10*3/uL — CL (ref 150–400)
RBC: 2.14 MIL/uL — ABNORMAL LOW (ref 4.22–5.81)
RDW: 18.4 % — ABNORMAL HIGH (ref 11.5–15.5)
WBC: 7.2 10*3/uL (ref 4.0–10.5)
nRBC: 0 % (ref 0.0–0.2)

## 2020-02-16 LAB — BASIC METABOLIC PANEL
Anion gap: 9 (ref 5–15)
BUN: 20 mg/dL (ref 6–20)
CO2: 26 mmol/L (ref 22–32)
Calcium: 8.9 mg/dL (ref 8.9–10.3)
Chloride: 102 mmol/L (ref 98–111)
Creatinine, Ser: 1.16 mg/dL (ref 0.61–1.24)
GFR, Estimated: >60 mL/min (ref >60)
Glucose, Bld: 118 mg/dL — ABNORMAL HIGH (ref 70–99)
Potassium: 4.2 mmol/L (ref 3.5–5.1)
Sodium: 137 mmol/L (ref 135–145)

## 2020-02-16 LAB — RESPIRATORY PANEL BY RT PCR (FLU A&B, COVID)
Influenza A by PCR: NEGATIVE
Influenza B by PCR: NEGATIVE
SARS Coronavirus 2 by RT PCR: NEGATIVE

## 2020-02-16 NOTE — ED Provider Notes (Signed)
[facility] Emergency Department Provider Note MRN:  [id number]  Arrival date & time: 02/16/20     Chief Complaint   Fever   History of Present Illness   Hunter Smith is a 59 y.o. year-old male with a history of MDS presenting to the ED with chief complaint of fever.  Patient felt hot earlier this evening, no measured temperature of 100.8.  Patient is on chemotherapy for myelodysplastic syndrome.  Last infusion 7 days ago.  Denies cough, no nausea, no vomiting, no no diarrhea, no chest pain or shortness of breath, no abdominal pain, no rash.  Review of Systems  A complete 10 system review of systems was obtained and all systems are negative except as noted in the HPI and PMH.   Patient's Health History    Past Medical History:  Diagnosis Date  . Cancer ([place])   . Port-A-Cath in place 12/28/2019    Past Surgical History:  Procedure Laterality Date  . COLONOSCOPY    . PORTACATH PLACEMENT Right 01/18/2020   Procedure: INSERTION PORT-A-CATH;  Surgeon: Aviva Signs, MD;  Location: [facility];  Service: General;  Laterality: Right;    Family History  Problem Relation Age of Onset  . Bladder Cancer Father   . Scoliosis Sister     Social History   Socioeconomic History  . Marital status: Married    Spouse name: Not on file  . Number of children: Not on file  . Years of education: Not on file  . Highest education level: Not on file  Occupational History  . Occupation: Employed  Tobacco Use  . Smoking status: Former Smoker    Types: Cigarettes    Quit date: 05/03/1988    Years since quitting: 31.8  . Smokeless tobacco: Never Used  Substance and Sexual Activity  . Alcohol use: Yes    Alcohol/week: 1.0 - 2.0 standard drink    Types: 1 - 2 Standard drinks or equivalent per week  . Drug use: Never  . Sexual activity: Yes  Other Topics Concern  . Not on file  Social History Narrative  . Not on file   Social Determinants of Health   Financial  Resource Strain: Low Risk   . Difficulty of Paying Living Expenses: Not hard at all  Food Insecurity: No Food Insecurity  . Worried About Charity fundraiser in the Last Year: Never true  . Ran Out of Food in the Last Year: Never true  Transportation Needs: No Transportation Needs  . Lack of Transportation (Medical): No  . Lack of Transportation (Non-Medical): No  Physical Activity:   . Days of Exercise per Week: Not on file  . Minutes of Exercise per Session: Not on file  Stress:   . Feeling of Stress : Not on file  Social Connections: Unknown  . Frequency of Communication with Friends and Family: More than three times a week  . Frequency of Social Gatherings with Friends and Family: More than three times a week  . Attends Religious Services: Not on file  . Active Member of Clubs or Organizations: Not on file  . Attends Archivist Meetings: Not on file  . Marital Status: Married  Human resources officer Violence:   . Fear of Current or Ex-Partner: Not on file  . Emotionally Abused: Not on file  . Physically Abused: Not on file  . Sexually Abused: Not on file     Physical Exam   Vitals:   02/16/20 0050 02/16/20 6063  BP: 140/89 127/84  Pulse: (!) 111 93  Resp: 15 16  Temp: 99.1 F (37.3 C)   SpO2: 99% 99%    CONSTITUTIONAL: Well-appearing, NAD NEURO:  Alert and oriented x 3, no focal deficits EYES:  eyes equal and reactive ENT/NECK:  no LAD, no JVD CARDIO: Tachycardic rate, well-perfused, normal S1 and S2 PULM:  CTAB no wheezing or rhonchi GI/GU:  normal bowel sounds, non-distended, non-tender MSK/SPINE:  No gross deformities, no edema SKIN:  no rash, atraumatic PSYCH:  Appropriate speech and behavior  *Additional and/or pertinent findings included in MDM below  Diagnostic and Interventional Summary    EKG Interpretation  Date/Time:    Ventricular Rate:    PR Interval:    QRS Duration:   QT Interval:    QTC Calculation:   R Axis:     Text  Interpretation:        Labs Reviewed  BASIC METABOLIC PANEL - Abnormal; Notable for the following components:      Result Value   Glucose, Bld 118 (*)    All other components within normal limits  CBC WITH DIFFERENTIAL/PLATELET - Abnormal; Notable for the following components:   RBC 2.14 (*)    Hemoglobin 7.0 (*)    HCT 20.7 (*)    RDW 18.4 (*)    Platelets 16 (*)    Neutro Abs 1.6 (*)    Monocytes Absolute 4.0 (*)    Abs Immature Granulocytes 0.15 (*)    All other components within normal limits  URINALYSIS, ROUTINE W REFLEX MICROSCOPIC - Abnormal; Notable for the following components:   Hgb urine dipstick SMALL (*)    All other components within normal limits  CULTURE, BLOOD (SINGLE)  RESPIRATORY PANEL BY RT PCR (FLU A&B, COVID)    DG Chest Port 1 View  Final Result      Medications - No data to display   Procedures  /  Critical Care Procedures  ED Course and Medical Decision Making  I have reviewed the triage vital signs, the nursing notes, and pertinent available records from the EMR.  Listed above are laboratory and imaging tests that I personally ordered, reviewed, and interpreted and then considered in my medical decision making (see below for details).  Patient arrives mildly tachycardic with oral temperature 99.1, no infectious symptoms, very well-appearing, normal blood pressure, will begin infectious work-up, and check neutrophil count.  Currently receiving azacitidine which can cause neutropenia.     Labs overall at baseline, ANC is 1.6 and therefore less concern for neutropenic fever.  Patient continues to look and feel well.  Appropriate for discharge, has follow-up with oncologist on Monday.  Barth Kirks. Sedonia Small, MD [place] [email or web address]  Final Clinical Impressions(s) / ED Diagnoses     ICD-10-CM   1. Fever, unspecified fever cause  R50.9     ED Discharge Orders    None       Discharge  Instructions Discussed with and Provided to Patient:     Discharge Instructions     You were evaluated in the Emergency Department and after careful evaluation, we did not find any emergent condition requiring admission or further testing in the hospital.  Your exam/testing today was overall reassuring.  Please follow-up closely with your oncologist and discuss this fever.  Please return to the Emergency Department if you experience any worsening of your condition.  Thank you for allowing [place] to be a part of your care.  Maudie Flakes, MD 02/16/20 [phone]

## 2020-02-16 NOTE — ED Triage Notes (Signed)
Pt states he began not to feel good after eating dinner tonight. He states he checked his temp and it was up to 100.8. States he took tylenol at home. Pt is current [facility] pt.

## 2020-02-16 NOTE — Discharge Instructions (Addendum)
You were evaluated in the Emergency Department and after careful evaluation, we did not find any emergent condition requiring admission or further testing in the hospital.  Your exam/testing today was overall reassuring.  Please follow-up closely with your oncologist and discuss this fever.  Please return to the Emergency Department if you experience any worsening of your condition.  Thank you for allowing [place] to be a part of your care.

## 2020-02-16 NOTE — ED Notes (Signed)
Date and time results received: 02/16/20 2:08 AM(use smartphrase ".now" to insert current time)  Test: platelets Critical Value: 16  Name of Provider Notified: Dr Sedonia Small  Orders Received? Or Actions Taken?: see chart

## 2020-02-18 ENCOUNTER — Inpatient Hospital Stay (HOSPITAL_COMMUNITY): Payer: 59

## 2020-02-18 ENCOUNTER — Other Ambulatory Visit (HOSPITAL_COMMUNITY): Payer: Self-pay | Admitting: *Deleted

## 2020-02-18 ENCOUNTER — Other Ambulatory Visit: Payer: Self-pay

## 2020-02-18 DIAGNOSIS — D46Z Other myelodysplastic syndromes: Secondary | ICD-10-CM

## 2020-02-18 DIAGNOSIS — C9310 Chronic myelomonocytic leukemia not having achieved remission: Secondary | ICD-10-CM | POA: Diagnosis not present

## 2020-02-18 LAB — COMPREHENSIVE METABOLIC PANEL
ALT: 25 U/L (ref 0–44)
AST: 17 U/L (ref 15–41)
Albumin: 3.4 g/dL — ABNORMAL LOW (ref 3.5–5.0)
Alkaline Phosphatase: 33 U/L — ABNORMAL LOW (ref 38–126)
Anion gap: 7 (ref 5–15)
BUN: 17 mg/dL (ref 6–20)
CO2: 25 mmol/L (ref 22–32)
Calcium: 8.7 mg/dL — ABNORMAL LOW (ref 8.9–10.3)
Chloride: 104 mmol/L (ref 98–111)
Creatinine, Ser: 0.96 mg/dL (ref 0.61–1.24)
GFR, Estimated: >60 mL/min (ref >60)
Glucose, Bld: 141 mg/dL — ABNORMAL HIGH (ref 70–99)
Potassium: 3.8 mmol/L (ref 3.5–5.1)
Sodium: 136 mmol/L (ref 135–145)
Total Bilirubin: 0.8 mg/dL (ref 0.3–1.2)
Total Protein: 6.5 g/dL (ref 6.5–8.1)

## 2020-02-18 LAB — CBC WITH DIFFERENTIAL/PLATELET
Basophils Absolute: 0 10*3/uL (ref 0.0–0.1)
Basophils Relative: 0 %
Blasts: 1 %
Eosinophils Absolute: 0.4 10*3/uL (ref 0.0–0.5)
Eosinophils Relative: 4 %
HCT: 19.6 % — ABNORMAL LOW (ref 39.0–52.0)
Hemoglobin: 6.5 g/dL — CL (ref 13.0–17.0)
Lymphocytes Relative: 15 %
Lymphs Abs: 1.3 10*3/uL (ref 0.7–4.0)
MCH: 32 pg (ref 26.0–34.0)
MCHC: 33.2 g/dL (ref 30.0–36.0)
MCV: 96.6 fL (ref 80.0–100.0)
Metamyelocytes Relative: 3 %
Monocytes Absolute: 3.3 10*3/uL — ABNORMAL HIGH (ref 0.1–1.0)
Monocytes Relative: 37 %
Myelocytes: 5 %
Neutro Abs: 2.9 10*3/uL (ref 1.7–7.7)
Neutrophils Relative %: 33 %
Platelets: 16 10*3/uL — CL (ref 150–400)
Promyelocytes Relative: 2 %
RBC: 2.03 MIL/uL — ABNORMAL LOW (ref 4.22–5.81)
RDW: 18.2 % — ABNORMAL HIGH (ref 11.5–15.5)
WBC: 8.8 10*3/uL (ref 4.0–10.5)
nRBC: 0 % (ref 0.0–0.2)

## 2020-02-18 LAB — PREPARE RBC (CROSSMATCH)

## 2020-02-18 LAB — LACTATE DEHYDROGENASE: LDH: 154 U/L (ref 98–192)

## 2020-02-18 MED ORDER — HEPARIN SOD (PORK) LOCK FLUSH 100 UNIT/ML IV SOLN
500.0000 [IU] | Freq: Every day | INTRAVENOUS | Status: AC | PRN
  Administered 2020-02-18: 500 [IU]

## 2020-02-18 MED ORDER — SODIUM CHLORIDE 0.9% FLUSH: 10.0000 mL | INTRAVENOUS | Status: AC | PRN

## 2020-02-18 MED ORDER — AMOXICILLIN-POT CLAVULANATE 875-125 MG PO TABS: 1.0000 | ORAL_TABLET | Freq: Two times a day (BID) | ORAL | 0 refills | Status: DC

## 2020-02-18 MED ORDER — HEPARIN SOD (PORK) LOCK FLUSH 100 UNIT/ML IV SOLN: 500.0000 [IU] | Freq: Every day | INTRAVENOUS | Status: AC | PRN

## 2020-02-18 MED ORDER — SODIUM CHLORIDE 0.9% FLUSH
10.0000 mL | INTRAVENOUS | Status: AC | PRN
  Administered 2020-02-18: 10 mL

## 2020-02-18 MED ORDER — SODIUM CHLORIDE 0.9% IV SOLUTION
250.0000 mL | Freq: Once | INTRAVENOUS | Status: AC
  Administered 2020-02-18: 250 mL via INTRAVENOUS

## 2020-02-18 NOTE — Patient Instructions (Signed)
[facility] at [facility]  Discharge Instructions:  Blood Transfusion, Adult, Care After This sheet gives you information about how to care for yourself after your procedure. Your doctor may also give you more specific instructions. If you have problems or questions, contact your doctor. What can I expect after the procedure? After the procedure, it is common to have:  Bruising and soreness at the IV site.  A fever or chills on the day of the procedure. This may be your body's response to the new blood cells received.  A headache. Follow these instructions at home: Insertion site care      Follow instructions from your doctor about how to take care of your insertion site. This is where an IV tube was put into your vein. Make sure you: ? Wash your hands with soap and water before and after you change your bandage (dressing). If you cannot use soap and water, use hand sanitizer. ? Change your bandage as told by your doctor.  Check your insertion site every day for signs of infection. Check for: ? Redness, swelling, or pain. ? Bleeding from the site. ? Warmth. ? Pus or a bad smell. General instructions  Take over-the-counter and prescription medicines only as told by your doctor.  Rest as told by your doctor.  Go back to your normal activities as told by your doctor.  Keep all follow-up visits as told by your doctor. This is important. Contact a doctor if:  You have itching or red, swollen areas of skin (hives).  You feel worried or nervous (anxious).  You feel weak after doing your normal activities.  You have redness, swelling, warmth, or pain around the insertion site.  You have blood coming from the insertion site, and the blood does not stop with pressure.  You have pus or a bad smell coming from the insertion site. Get help right away if:  You have signs of a serious reaction. This may be coming from an allergy or the body's defense  system (immune system). Signs include: ? Trouble breathing or shortness of breath. ? Swelling of the face or feeling warm (flushed). ? Fever or chills. ? Head, chest, or back pain. ? Dark pee (urine) or blood in the pee. ? Widespread rash. ? Fast heartbeat. ? Feeling dizzy or light-headed. You may receive your blood transfusion in an outpatient setting. If so, you will be told whom to contact to report any reactions. These symptoms may be an emergency. Do not wait to see if the symptoms will go away. Get medical help right away. Call your local emergency services (911 in the [place] Do not drive yourself to the hospital. Summary  Bruising and soreness at the IV site are common.  Check your insertion site every day for signs of infection.  Rest as told by your doctor. Go back to your normal activities as told by your doctor.  Get help right away if you have signs of a serious reaction. This information is not intended to replace advice given to you by your health care provider. Make sure you discuss any questions you have with your health care provider. Document Revised: 10/19/2018 Document Reviewed: 10/19/2018 Elsevier Patient Education  2020 Elsevier Inc.  _______________________________________________________________  Thank you for choosing [facility] at [facility] to provide your oncology and hematology care.  To afford each patient quality time with our providers, please arrive at least 15 minutes before your scheduled appointment.  You   You need to re-schedule your appointment if you arrive 10 or more minutes late.  We strive to give you quality time with our providers, and arriving late affects you and other patients whose appointments are after yours.  Also, if you no show three or more times for appointments you may be dismissed from the clinic.  Again, thank you for choosing [place] at [place] hope is that these requests will  allow you access to exceptional care and in a timely manner. _______________________________________________________________  If you have questions after your visit, please contact our office at [phone] between the hours of 8:30 a.m. and 5:00 p.m. Voicemails left after 4:30 p.m. will not be returned until the following business day. _______________________________________________________________  For prescription refill requests, have your pharmacy contact our office. _______________________________________________________________  Recommendations made by the consultant and any test results will be sent to your referring physician. _______________________________________________________________

## 2020-02-18 NOTE — Progress Notes (Signed)
augmentin orders placed per Dr. Delton Coombes.

## 2020-02-18 NOTE — Progress Notes (Signed)
CRITICAL VALUE ALERT Critical value received:  Hemoglobin 6.0, platelets 16 Date of notification:  02-18-2020 Time of notification: 1000 Critical value read back:  Yes.   Nurse who received alert:  C.Karlisa Gaubert RN MD notified time and response:  Dr. Raliegh Ip and give one unit of blood-irradiated.   One unit of blood given today per orders.  Vitals stable and discharged home from clinic ambulatory. Follow up as scheduled.

## 2020-02-19 LAB — TYPE AND SCREEN
ABO/RH(D): O POS
Antibody Screen: NEGATIVE
Unit division: 0

## 2020-02-19 LAB — BPAM RBC
Blood Product Expiration Date: 202110272359
ISSUE DATE / TIME: 202110111328
Unit Type and Rh: 5100

## 2020-02-21 ENCOUNTER — Inpatient Hospital Stay (HOSPITAL_COMMUNITY): Payer: 59

## 2020-02-21 ENCOUNTER — Other Ambulatory Visit: Payer: Self-pay

## 2020-02-21 ENCOUNTER — Encounter (HOSPITAL_COMMUNITY): Payer: Self-pay

## 2020-02-21 VITALS — BP 108/67 | HR 79 | Temp 97.1°F | Resp 17

## 2020-02-21 DIAGNOSIS — C9310 Chronic myelomonocytic leukemia not having achieved remission: Secondary | ICD-10-CM | POA: Diagnosis not present

## 2020-02-21 DIAGNOSIS — D46Z Other myelodysplastic syndromes: Secondary | ICD-10-CM

## 2020-02-21 DIAGNOSIS — D696 Thrombocytopenia, unspecified: Secondary | ICD-10-CM

## 2020-02-21 LAB — CBC WITH DIFFERENTIAL/PLATELET
Band Neutrophils: 7 %
Basophils Absolute: 0 10*3/uL (ref 0.0–0.1)
Basophils Relative: 0 %
Blasts: 2 %
Eosinophils Absolute: 0.1 10*3/uL (ref 0.0–0.5)
Eosinophils Relative: 2 %
HCT: 21.6 % — ABNORMAL LOW (ref 39.0–52.0)
Hemoglobin: 7.2 g/dL — ABNORMAL LOW (ref 13.0–17.0)
Lymphocytes Relative: 32 %
Lymphs Abs: 2.3 10*3/uL (ref 0.7–4.0)
MCH: 31.6 pg (ref 26.0–34.0)
MCHC: 33.3 g/dL (ref 30.0–36.0)
MCV: 94.7 fL (ref 80.0–100.0)
Metamyelocytes Relative: 3 %
Monocytes Absolute: 1.5 10*3/uL — ABNORMAL HIGH (ref 0.1–1.0)
Monocytes Relative: 21 %
Myelocytes: 6 %
Neutro Abs: 2.2 10*3/uL (ref 1.7–7.7)
Neutrophils Relative %: 23 %
Platelets: 13 10*3/uL — CL (ref 150–400)
Promyelocytes Relative: 4 %
RBC: 2.28 MIL/uL — ABNORMAL LOW (ref 4.22–5.81)
RDW: 17.8 % — ABNORMAL HIGH (ref 11.5–15.5)
WBC: 7.2 10*3/uL (ref 4.0–10.5)
nRBC: 0 % (ref 0.0–0.2)

## 2020-02-21 LAB — CULTURE, BLOOD (SINGLE)
Culture: NO GROWTH
Special Requests: ADEQUATE

## 2020-02-21 LAB — SAMPLE TO BLOOD BANK

## 2020-02-21 LAB — PREPARE RBC (CROSSMATCH)

## 2020-02-21 MED ORDER — DIPHENHYDRAMINE HCL 25 MG PO CAPS: 25.0000 mg | ORAL_CAPSULE | Freq: Once | ORAL | Status: DC

## 2020-02-21 MED ORDER — SODIUM CHLORIDE 0.9% FLUSH
10.0000 mL | INTRAVENOUS | Status: AC | PRN
  Administered 2020-02-21: 10 mL

## 2020-02-21 MED ORDER — SODIUM CHLORIDE 0.9% IV SOLUTION
250.0000 mL | Freq: Once | INTRAVENOUS | Status: AC
  Administered 2020-02-21: 250 mL via INTRAVENOUS

## 2020-02-21 MED ORDER — ACETAMINOPHEN 325 MG PO TABS: 650.0000 mg | ORAL_TABLET | Freq: Once | ORAL | Status: DC

## 2020-02-21 MED ORDER — HEPARIN SOD (PORK) LOCK FLUSH 100 UNIT/ML IV SOLN
500.0000 [IU] | Freq: Every day | INTRAVENOUS | Status: AC | PRN
  Administered 2020-02-21: 500 [IU]

## 2020-02-21 NOTE — Patient Instructions (Addendum)
[place] at [facility] Discharge Instructions  Labs drawn from portacath today   Thank you for choosing [place] at [facility] to provide your oncology and hematology care.  To afford each patient quality time with our provider, please arrive at least 15 minutes before your scheduled appointment time.   If you have a lab appointment with the [place] please come in thru the Main Entrance and check in at the main information desk.  You need to re-schedule your appointment should you arrive 10 or more minutes late.  We strive to give you quality time with our providers, and arriving late affects you and other patients whose appointments are after yours.  Also, if you no show three or more times for appointments you may be dismissed from the clinic at the providers discretion.     Again, thank you for choosing [facility].  Our hope is that these requests will decrease the amount of time that you wait before being seen by our physicians.       _____________________________________________________________  Should you have questions after your visit to [facility], please contact our office at [phone] and follow the prompts.  Our office hours are 8:00 a.m. and 4:30 p.m. Monday - Friday.  Please note that voicemails left after 4:00 p.m. may not be returned until the following business day.  We are closed weekends and major holidays.  You do have access to a nurse 24-7, just call the main number to the clinic [phone] and do not press any options, hold on the line and a nurse will answer the phone.    For prescription refill requests, have your pharmacy contact our office and allow 72 hours.    Due to Covid, you will need to wear a mask upon entering the hospital. If you do not have a mask, a mask will be given to you at the Main Entrance upon arrival. For doctor visits, patients may have 1 support person age 63  or older with them. For treatment visits, patients can not have anyone with them due to social distancing guidelines and our immunocompromised population.    [place] at [facility]  Discharge Instructions:   _______________________________________________________________  Thank you for choosing [place] at [facility] to provide your oncology and hematology care.  To afford each patient quality time with our providers, please arrive at least 15 minutes before your scheduled appointment.  You need to re-schedule your appointment if you arrive 10 or more minutes late.  We strive to give you quality time with our providers, and arriving late affects you and other patients whose appointments are after yours.  Also, if you no show three or more times for appointments you may be dismissed from the clinic.  Again, thank you for choosing [place] [facility] [place] hope is that these requests will allow you access to exceptional care and in a timely manner. _______________________________________________________________  If you have questions after your visit, please contact our office at [phone] between the hours of 8:30 a.m. and 5:00 p.m. Voicemails left after 4:30 p.m. will not be returned until the following business day. _______________________________________________________________  For prescription refill requests, have your pharmacy contact our office. _______________________________________________________________  Recommendations made by the consultant and any test results will be sent to your referring physician. _______________________________________________________________

## 2020-02-21 NOTE — Progress Notes (Signed)
Patient presents today for blood work and possible blood products. No complaints of pain today.   Verbal orders received to infuse 1 Unit of Platelets and 1 Unit of PRBC's per infusion guidelines per Dr. Delton Coombes. HGB 7.2. Platelets 13. Per Dr. Delton Coombes infuse 1 unit of Platelets if value is below 15.   1 Unit of Platelets and 1 Unit of PRBC's given today per MD orders. Tolerated infusion without adverse affects. Vital signs stable. No complaints at this time. Discharged from clinic ambulatory in stable condition. Alert and oriented x 3. F/U with [facility] as scheduled.

## 2020-02-21 NOTE — Progress Notes (Signed)
CRITICAL VALUE ALERT  Critical Value:  Platelets 13  Date & Time Notied:  02/21/2020 at 0848  Provider Notified: Dr. Delton Coombes  Orders Received/Actions taken: transfuse 1 unit irradiated platelets and 1 unit irradiated PRBC for symptomatic anemia (hgb 7.2).

## 2020-02-22 LAB — TYPE AND SCREEN
ABO/RH(D): O POS
Antibody Screen: NEGATIVE
Unit division: 0

## 2020-02-22 LAB — BPAM RBC
Blood Product Expiration Date: 202110292359
ISSUE DATE / TIME: 202110141116
Unit Type and Rh: 5100

## 2020-02-22 LAB — PREPARE PLATELET PHERESIS: Unit division: 0

## 2020-02-22 LAB — BPAM PLATELET PHERESIS
Blood Product Expiration Date: 202110142359
ISSUE DATE / TIME: 202110140958
Unit Type and Rh: 7300

## 2020-02-25 ENCOUNTER — Other Ambulatory Visit: Payer: Self-pay

## 2020-02-25 ENCOUNTER — Encounter (HOSPITAL_COMMUNITY): Payer: Self-pay

## 2020-02-25 ENCOUNTER — Inpatient Hospital Stay (HOSPITAL_COMMUNITY): Payer: 59

## 2020-02-25 DIAGNOSIS — D46Z Other myelodysplastic syndromes: Secondary | ICD-10-CM

## 2020-02-25 DIAGNOSIS — C9310 Chronic myelomonocytic leukemia not having achieved remission: Secondary | ICD-10-CM | POA: Diagnosis not present

## 2020-02-25 LAB — CBC WITH DIFFERENTIAL/PLATELET
Abs Immature Granulocytes: 2.47 10*3/uL — ABNORMAL HIGH (ref 0.00–0.07)
Band Neutrophils: 3 %
Basophils Absolute: 0 10*3/uL (ref 0.0–0.1)
Basophils Relative: 0 %
Blasts: 1 %
Eosinophils Absolute: 0 10*3/uL (ref 0.0–0.5)
Eosinophils Relative: 8 %
HCT: 24 % — ABNORMAL LOW (ref 39.0–52.0)
Hemoglobin: 8 g/dL — ABNORMAL LOW (ref 13.0–17.0)
Immature Granulocytes: 21 %
Lymphocytes Relative: 38 %
Lymphs Abs: 2 10*3/uL (ref 0.7–4.0)
MCH: 31.3 pg (ref 26.0–34.0)
MCHC: 33.3 g/dL (ref 30.0–36.0)
MCV: 93.8 fL (ref 80.0–100.0)
Metamyelocytes Relative: 11 %
Monocytes Absolute: 3.5 10*3/uL — ABNORMAL HIGH (ref 0.1–1.0)
Monocytes Relative: 20 %
Myelocytes: 2 %
Neutro Abs: 3.6 10*3/uL (ref 1.7–7.7)
Neutrophils Relative %: 16 %
Platelets: 17 10*3/uL — CL (ref 150–400)
Promyelocytes Relative: 1 %
RBC: 2.56 MIL/uL — ABNORMAL LOW (ref 4.22–5.81)
RDW: 17.7 % — ABNORMAL HIGH (ref 11.5–15.5)
WBC: 11.6 10*3/uL — ABNORMAL HIGH (ref 4.0–10.5)
nRBC: 0 % (ref 0.0–0.2)
nRBC: 0 /100 WBC

## 2020-02-25 LAB — SAMPLE TO BLOOD BANK

## 2020-02-25 MED ORDER — SODIUM CHLORIDE 0.9% FLUSH
10.0000 mL | INTRAVENOUS | Status: DC | PRN
  Administered 2020-02-25: 10 mL via INTRAVENOUS

## 2020-02-25 MED ORDER — HEPARIN SOD (PORK) LOCK FLUSH 100 UNIT/ML IV SOLN
500.0000 [IU] | Freq: Once | INTRAVENOUS | Status: AC
  Administered 2020-02-25: 500 [IU] via INTRAVENOUS

## 2020-02-25 NOTE — Progress Notes (Unsigned)
CRITICAL VALUE STICKER  CRITICAL VALUE: Plt 17  DATE & TIME NOTIFIED:  02/25/2020 @ 1135  MESSENGER (representative from lab): Deanne Coffer  MD NOTIFIED: Dr. Delton Coombes  TIME OF NOTIFICATION: 5885  RESPONSE: no further instructions at this time

## 2020-02-25 NOTE — Patient Instructions (Signed)
[facility]  Discharge Instructions:   _______________________________________________________________  Thank you for choosing [facility] at [facility] to provide your oncology and hematology care.  To afford each patient quality time with our providers, please arrive at least 15 minutes before your scheduled appointment.  You need to re-schedule your appointment if you arrive 10 or more minutes late.  We strive to give you quality time with our providers, and arriving late affects you and other patients whose appointments are after yours.  Also, if you no show three or more times for appointments you may be dismissed from the clinic.  Again, thank you for choosing [facility] at [facility]. Our hope is that these requests will allow you access to exceptional care and in a timely manner. _______________________________________________________________  If you have questions after your visit, please contact our office at [phone] between the hours of 8:30 a.m. and 5:00 p.m. Voicemails left after 4:30 p.m. will not be returned until the following business day. _______________________________________________________________  For prescription refill requests, have your pharmacy contact our office. _______________________________________________________________  Recommendations made by the consultant and any test results will be sent to your referring physician. _______________________________________________________________ 

## 2020-02-25 NOTE — Progress Notes (Signed)
Platelets 17 and hemoglobin 8.0.  Reviewed with Dr Raliegh Ip.  No blood products needed today.

## 2020-03-03 ENCOUNTER — Inpatient Hospital Stay (HOSPITAL_COMMUNITY): Payer: 59

## 2020-03-03 ENCOUNTER — Inpatient Hospital Stay (HOSPITAL_BASED_OUTPATIENT_CLINIC_OR_DEPARTMENT_OTHER): Payer: 59 | Admitting: Hematology

## 2020-03-03 ENCOUNTER — Other Ambulatory Visit: Payer: Self-pay

## 2020-03-03 VITALS — BP 138/75 | HR 83 | Temp 97.3°F | Resp 18 | Wt 223.8 lb

## 2020-03-03 VITALS — BP 100/55 | HR 84 | Temp 98.0°F | Resp 18

## 2020-03-03 DIAGNOSIS — D696 Thrombocytopenia, unspecified: Secondary | ICD-10-CM | POA: Diagnosis not present

## 2020-03-03 DIAGNOSIS — D539 Nutritional anemia, unspecified: Secondary | ICD-10-CM

## 2020-03-03 DIAGNOSIS — D46Z Other myelodysplastic syndromes: Secondary | ICD-10-CM

## 2020-03-03 DIAGNOSIS — C9310 Chronic myelomonocytic leukemia not having achieved remission: Secondary | ICD-10-CM | POA: Diagnosis not present

## 2020-03-03 DIAGNOSIS — Z95828 Presence of other vascular implants and grafts: Secondary | ICD-10-CM

## 2020-03-03 LAB — CBC WITH DIFFERENTIAL/PLATELET
Abs Immature Granulocytes: 7.04 10*3/uL — ABNORMAL HIGH (ref 0.00–0.07)
Band Neutrophils: 13 %
Basophils Absolute: 0 10*3/uL (ref 0.0–0.1)
Basophils Relative: 0 %
Eosinophils Absolute: 0.3 10*3/uL (ref 0.0–0.5)
Eosinophils Relative: 1 %
HCT: 20.5 % — ABNORMAL LOW (ref 39.0–52.0)
Hemoglobin: 6.8 g/dL — CL (ref 13.0–17.0)
Lymphocytes Relative: 21 %
Lymphs Abs: 5.5 10*3/uL — ABNORMAL HIGH (ref 0.7–4.0)
MCH: 31.3 pg (ref 26.0–34.0)
MCHC: 33.2 g/dL (ref 30.0–36.0)
MCV: 94.5 fL (ref 80.0–100.0)
Metamyelocytes Relative: 8 %
Monocytes Absolute: 8.2 10*3/uL — ABNORMAL HIGH (ref 0.1–1.0)
Monocytes Relative: 31 %
Myelocytes: 7 %
Neutro Abs: 7.9 10*3/uL — ABNORMAL HIGH (ref 1.7–7.7)
Neutrophils Relative %: 17 %
Platelets: 16 10*3/uL — CL (ref 150–400)
Promyelocytes Relative: 2 %
RBC: 2.17 MIL/uL — ABNORMAL LOW (ref 4.22–5.81)
RDW: 17.8 % — ABNORMAL HIGH (ref 11.5–15.5)
WBC: 26.4 10*3/uL — ABNORMAL HIGH (ref 4.0–10.5)
nRBC: 0 % (ref 0.0–0.2)

## 2020-03-03 LAB — COMPREHENSIVE METABOLIC PANEL
ALT: 43 U/L (ref 0–44)
AST: 30 U/L (ref 15–41)
Albumin: 3.7 g/dL (ref 3.5–5.0)
Alkaline Phosphatase: 42 U/L (ref 38–126)
Anion gap: 7 (ref 5–15)
BUN: 15 mg/dL (ref 6–20)
CO2: 25 mmol/L (ref 22–32)
Calcium: 9 mg/dL (ref 8.9–10.3)
Chloride: 105 mmol/L (ref 98–111)
Creatinine, Ser: 1 mg/dL (ref 0.61–1.24)
GFR, Estimated: >60 mL/min (ref >60)
Glucose, Bld: 108 mg/dL — ABNORMAL HIGH (ref 70–99)
Potassium: 3.9 mmol/L (ref 3.5–5.1)
Sodium: 137 mmol/L (ref 135–145)
Total Bilirubin: 0.6 mg/dL (ref 0.3–1.2)
Total Protein: 6.5 g/dL (ref 6.5–8.1)

## 2020-03-03 LAB — LACTATE DEHYDROGENASE: LDH: 291 U/L — ABNORMAL HIGH (ref 98–192)

## 2020-03-03 LAB — PREPARE RBC (CROSSMATCH)

## 2020-03-03 MED ORDER — PALONOSETRON HCL INJECTION 0.25 MG/5ML
0.2500 mg | Freq: Once | INTRAVENOUS | Status: AC
  Administered 2020-03-03: 0.25 mg via INTRAVENOUS
  Filled 2020-03-03: qty 5

## 2020-03-03 MED ORDER — SODIUM CHLORIDE 0.9 % IV SOLN
75.0000 mg/m2 | Freq: Once | INTRAVENOUS | Status: AC
  Administered 2020-03-03: 170 mg via INTRAVENOUS
  Filled 2020-03-03: qty 17

## 2020-03-03 MED ORDER — HEPARIN SOD (PORK) LOCK FLUSH 100 UNIT/ML IV SOLN
500.0000 [IU] | Freq: Once | INTRAVENOUS | Status: AC | PRN
  Administered 2020-03-03: 500 [IU]

## 2020-03-03 MED ORDER — SODIUM CHLORIDE 0.9 % IV SOLN: Freq: Once | INTRAVENOUS | Status: AC

## 2020-03-03 MED ORDER — SODIUM CHLORIDE 0.9% FLUSH
10.0000 mL | INTRAVENOUS | Status: DC | PRN
  Administered 2020-03-03: 10 mL

## 2020-03-03 MED ORDER — SODIUM CHLORIDE 0.9 % IV SOLN
10.0000 mg | Freq: Once | INTRAVENOUS | Status: AC
  Administered 2020-03-03: 10 mg via INTRAVENOUS
  Filled 2020-03-03: qty 1

## 2020-03-03 NOTE — Progress Notes (Signed)
CRITICAL VALUE ALERT  Critical Value:  HGB 6.8. Platelets 16  Date & Time Notied:  03/03/20 @ 09:26am by Omar Person lab tech.   Provider Notified: Dr. Delton Coombes instant message.

## 2020-03-03 NOTE — Patient Instructions (Signed)
[facility] at [facility] Discharge Instructions  Labs drawn from portacath today   Thank you for choosing [facility] at [facility] to provide your oncology and hematology care.  To afford each patient quality time with our provider, please arrive at least 15 minutes before your scheduled appointment time.   If you have a lab appointment with the Cancer Center please come in thru the Main Entrance and check in at the main information desk.  You need to re-schedule your appointment should you arrive 10 or more minutes late.  We strive to give you quality time with our providers, and arriving late affects you and other patients whose appointments are after yours.  Also, if you no show three or more times for appointments you may be dismissed from the clinic at the providers discretion.     Again, thank you for choosing [facility].  Our hope is that these requests will decrease the amount of time that you wait before being seen by our physicians.       _____________________________________________________________  Should you have questions after your visit to [facility], please contact our office at [phone] and follow the prompts.  Our office hours are 8:00 a.m. and 4:30 p.m. Monday - Friday.  Please note that voicemails left after 4:00 p.m. may not be returned until the following business day.  We are closed weekends and major holidays.  You do have access to a nurse 24-7, just call the main number to the clinic [phone] and do not press any options, hold on the line and a nurse will answer the phone.    For prescription refill requests, have your pharmacy contact our office and allow 72 hours.    Due to Covid, you will need to wear a mask upon entering the hospital. If you do not have a mask, a mask will be given to you at the Main Entrance upon arrival. For doctor visits, patients may have 1 support person age 18  or older with them. For treatment visits, patients can not have anyone with them due to social distancing guidelines and our immunocompromised population.     

## 2020-03-03 NOTE — Progress Notes (Signed)
Patient was assessed by Dr. Delton Coombes and labs have been reviewed.  Hemoglobin 6.8, platelets 16.  Orders already given to primary RN for 2 units PRBC and no platelets.  Patient is okay to proceed with treatment today. Primary RN and pharmacy aware.

## 2020-03-03 NOTE — Patient Instructions (Addendum)
[name] at [facility] Discharge Instructions  You were seen today by Dr. Delton Coombes. He went over your recent results. You received your treatment today; continue receiving your daily treatments. Dr. Delton Coombes will see you back in 4 weeks for labs and follow up.   Thank you for choosing [place] at [facility] to provide your oncology and hematology care.  To afford each patient quality time with our provider, please arrive at least 15 minutes before your scheduled appointment time.   If you have a lab appointment with the [place] [facility]  please come in thru the Main Entrance and check in at the main information desk  You need to re-schedule your appointment should you arrive 10 or more minutes late.  We strive to give you quality time with our providers, and arriving late affects you and other patients whose appointments are after yours.  Also, if you no show three or more times for appointments you may be dismissed from the clinic at the providers discretion.     Again, thank you for choosing [facility].  Our hope is that these requests will decrease the amount of time that you wait before being seen by our physicians.       _____________________________________________________________  Should you have questions after your visit to [facility], please contact our office at [phone] between the hours of 8:00 a.m. and 4:30 p.m.  Voicemails left after 4:00 p.m. will not be returned until the following business day.  For prescription refill requests, have your pharmacy contact our office and allow 72 hours.    Cancer Center Support Programs:   > Cancer Support Group  2nd Tuesday of the month 1pm-2pm, Journey Room

## 2020-03-03 NOTE — Progress Notes (Signed)
[name], [place] [address]   CLINIC:  Medical Oncology/Hematology  PCP:  Celene Squibb, MD [address] [place] Cro[place] [address] [phone]   REASON FOR VISIT:  Follow-up for MDS, macrocytic anemia and thrombocytopenia  PRIOR THERAPY: None  NGS Results: Not done  CURRENT THERAPY: Azacitidine every 4 weeks  BRIEF ONCOLOGIC HISTORY:  Oncology History  Myelodysplastic syndrome, high grade ([place])  12/28/2019 Initial Diagnosis   MDS (myelodysplastic syndrome), high grade ([place])   12/31/2019 -  Chemotherapy   The patient had palonosetron (ALOXI) injection 0.25 mg, 0.25 mg, Intravenous,  Once, 2 of 4 cycles Administration: 0.25 mg (01/02/2020), 0.25 mg (01/04/2020), 0.25 mg (01/07/2020), 0.25 mg (02/05/2020), 0.25 mg (02/07/2020), 0.25 mg (02/11/2020), 0.25 mg (02/13/2020) azaCITIDine (VIDAZA) 170 mg in sodium chloride 0.9 % 50 mL chemo infusion, 75 mg/m2 = 170 mg, Intravenous, Once, 2 of 4 cycles Administration: 170 mg (12/31/2019), 170 mg (01/01/2020), 170 mg (01/02/2020), 170 mg (01/03/2020), 170 mg (01/04/2020), 170 mg (01/07/2020), 170 mg (01/08/2020), 170 mg (02/05/2020), 170 mg (02/06/2020), 170 mg (02/07/2020), 170 mg (02/08/2020), 170 mg (02/11/2020), 170 mg (02/12/2020), 170 mg (02/13/2020)  for chemotherapy treatment.      CANCER STAGING: Cancer Staging No matching staging information was found for the patient.  INTERVAL HISTORY:  Hunter Smith, a 59 y.o. male, returns for routine follow-up and consideration for next cycle of chemotherapy. Hunter Smith was last seen on 02/11/2020.  Due for cycle #3 of azacitidine today.   Today he is accompanied by his wife. Overall, he tells me he has been feeling pretty well. He is tolerating the treatments well and denies severe fatigue or nausea. He reports having occasional constipation and takes Miralax. His appetite is great. He gets seasonal allergies in the fall and takes Claritin as needed; he denies having  fevers, easy bruising or bleeding, nosebleeds, hematochezia or hematuria.  He will go back to [facility] on 11/8 to see Dr. Junius Finner and then bone marrow biopsy on 11/18.  Overall, he feels ready for next cycle of chemo today.    REVIEW OF SYSTEMS:  Review of Systems  Constitutional: Positive for fatigue (when H/H is low). Negative for appetite change and fever.  HENT:   Negative for nosebleeds.   Respiratory: Positive for shortness of breath (when H/H is low).   Cardiovascular: Negative for leg swelling.  Gastrointestinal: Positive for constipation. Negative for blood in stool and nausea.  Genitourinary: Negative for hematuria.   Hematological: Does not bruise/bleed easily.  All other systems reviewed and are negative.   PAST MEDICAL/SURGICAL HISTORY:  Past Medical History:  Diagnosis Date  . Cancer ([place])   . Port-A-Cath in place 12/28/2019   Past Surgical History:  Procedure Laterality Date  . COLONOSCOPY    . PORTACATH PLACEMENT Right 01/18/2020   Procedure: INSERTION PORT-A-CATH;  Surgeon: Aviva Signs, MD;  Location: [facility];  Service: General;  Laterality: Right;    SOCIAL HISTORY:  Social History   Socioeconomic History  . Marital status: Married    Spouse name: Not on file  . Number of children: Not on file  . Years of education: Not on file  . Highest education level: Not on file  Occupational History  . Occupation: Employed  Tobacco Use  . Smoking status: Former Smoker    Types: Cigarettes    Quit date: 05/03/1988    Years since quitting: 31.8  . Smokeless tobacco: Never Used  Substance and Sexual  Activity  . Alcohol use: Yes    Alcohol/week: 1.0 - 2.0 standard drink    Types: 1 - 2 Standard drinks or equivalent per week  . Drug use: Never  . Sexual activity: Yes  Other Topics Concern  . Not on file  Social History Narrative  . Not on file   Social Determinants of Health   Financial Resource Strain: Low Risk   . Difficulty of Paying Living Expenses:  Not hard at all  Food Insecurity: No Food Insecurity  . Worried About Charity fundraiser in the Last Year: Never true  . Ran Out of Food in the Last Year: Never true  Transportation Needs: No Transportation Needs  . Lack of Transportation (Medical): No  . Lack of Transportation (Non-Medical): No  Physical Activity:   . Days of Exercise per Week: Not on file  . Minutes of Exercise per Session: Not on file  Stress:   . Feeling of Stress : Not on file  Social Connections: Unknown  . Frequency of Communication with Friends and Family: More than three times a week  . Frequency of Social Gatherings with Friends and Family: More than three times a week  . Attends Religious Services: Not on file  . Active Member of Clubs or Organizations: Not on file  . Attends Archivist Meetings: Not on file  . Marital Status: Married  Human resources officer Violence:   . Fear of Current or Ex-Partner: Not on file  . Emotionally Abused: Not on file  . Physically Abused: Not on file  . Sexually Abused: Not on file    FAMILY HISTORY:  Family History  Problem Relation Age of Onset  . Bladder Cancer Father   . Scoliosis Sister     CURRENT MEDICATIONS:  Current Outpatient Medications  Medication Sig Dispense Refill  . azaCITIDine 5 mg/2 mLs in lactated ringers infusion Inject into the vein daily. Days 1-7 every 28 days    . docusate sodium (COLACE) 100 MG capsule Take 100 mg by mouth 2 (two) times daily.    . Multiple Vitamin (MULTIVITAMIN) tablet Take 1 tablet by mouth daily.    Marland Kitchen lidocaine-prilocaine (EMLA) cream Apply a small amount to port a cath site and cover with plastic wrap 1 hour prior to chemotherapy appointments (Patient not taking: Reported on 03/03/2020) 30 g 3  . prochlorperazine (COMPAZINE) 10 MG tablet Take 1 tablet (10 mg total) by mouth every 6 (six) hours as needed (Nausea or vomiting). (Patient not taking: Reported on 03/03/2020) 30 tablet 1   No current facility-administered  medications for this visit.    ALLERGIES:  No Known Allergies  PHYSICAL EXAM:  Performance status (ECOG): 1 - Symptomatic but completely ambulatory  Vitals:   03/03/20 0812  BP: 138/75  Pulse: 83  Resp: 18  Temp: (!) 97.3 F (36.3 C)  SpO2: 97%   Wt Readings from Last 3 Encounters:  03/03/20 223 lb 12.8 oz (101.5 kg)  02/16/20 225 lb 11.2 oz (102.4 kg)  02/11/20 225 lb 11.2 oz (102.4 kg)   Physical Exam Vitals reviewed.  Constitutional:      Appearance: Normal appearance. He is obese.  Cardiovascular:     Rate and Rhythm: Normal rate and regular rhythm.     Pulses: Normal pulses.     Heart sounds: Normal heart sounds.  Pulmonary:     Effort: Pulmonary effort is normal.     Breath sounds: Normal breath sounds.  Chest:     Comments:  Port-a-Cath in R chest Musculoskeletal:     Right lower leg: No edema.     Left lower leg: No edema.  Neurological:     General: No focal deficit present.     Mental Status: He is alert and oriented to person, place, and time.  Psychiatric:        Mood and Affect: Mood normal.        Behavior: Behavior normal.     LABORATORY DATA:  I have reviewed the labs as listed.  CBC Latest Ref Rng & Units 03/03/2020 02/25/2020 02/21/2020  WBC 4.0 - 10.5 K/uL 26.4(H) 11.6(H) 7.2  Hemoglobin 13.0 - 17.0 g/dL 6.8(LL) 8.0(L) 7.2(L)  Hematocrit 39 - 52 % 20.5(L) 24.0(L) 21.6(L)  Platelets 150 - 400 K/uL 16(LL) 17(LL) 13(LL)   CMP Latest Ref Rng & Units 03/03/2020 02/18/2020 02/16/2020  Glucose 70 - 99 mg/dL 108(H) 141(H) 118(H)  BUN 6 - 20 mg/dL 15 17 20   Creatinine 0.61 - 1.24 mg/dL 1.00 0.96 1.16  Sodium 135 - 145 mmol/L 137 136 137  Potassium 3.5 - 5.1 mmol/L 3.9 3.8 4.2  Chloride 98 - 111 mmol/L 105 104 102  CO2 22 - 32 mmol/L 25 25 26   Calcium 8.9 - 10.3 mg/dL 9.0 8.7(L) 8.9  Total Protein 6.5 - 8.1 g/dL 6.5 6.5 -  Total Bilirubin 0.3 - 1.2 mg/dL 0.6 0.8 -  Alkaline Phos 38 - 126 U/L 42 33(L) -  AST 15 - 41 U/L 30 17 -  ALT 0 - 44  U/L 43 25 -   Lab Results  Component Value Date   LDH 291 (H) 03/03/2020   LDH 154 02/18/2020   LDH 181 02/11/2020    DIAGNOSTIC IMAGING:  I have independently reviewed the scans and discussed with the patient. DG Chest Port 1 View  Result Date: 02/16/2020 CLINICAL DATA:  Fever EXAM: PORTABLE CHEST 1 VIEW COMPARISON:  01/18/2020 FINDINGS: Lungs are clear. No pneumothorax or pleural effusion. Cardiac size within normal limits. Right subclavian chest port tip is seen within the superior vena cava, unchanged. Pulmonary vascularity is normal. No acute bone abnormality. IMPRESSION: No active disease. Electronically Signed   By: Fidela Salisbury MD   On: 02/16/2020 01:45     ASSESSMENT:  1.High-grade MDS with excess blasts/CMML: -Presentation with pancytopenia and transfusion dependent anemia. -Bone marrow biopsy on 12/14/2019 at [facility] showed hypercellular marrow (90%) with dyserythropoiesis, dysgranulopoiesis and 16% blasts. Findings consistent with MDS-EB 2. FLT3 ITD/TKD negative. -Chromosome analysis 47, XY, +8[19]/46, XY[1]. -MDS FISH panel pending. -However his last 2 CBC showing increased white count of 14-15 K with peripheral blood monocytosis, also likely CMML. -He was evaluated by Dr. Jerrye Noble at [facility]. Bone marrow biopsy after cycle 2 at [facility] recommended. -Cycle 1 of azacitidine (5+2) on 12/31/2019. 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  Current biopsy shows similar hypercellular marrow with myeloid and erythroid dysplasia and blast fraction has decreased from 16% to 5-10%. -New plan is to repeat bone marrow biopsy after cycle 3.   PLAN:  1.High-grade MDS/CMML: -He tolerated cycle 2 of azacitidine (5+2) very well.  Did not have any GI side effects. -Has some easy bruising on the upper extremities but denies any active bleeding. -Reviewed his CBC.  Hemoglobin is 6.8.  He will receive 2 units of irradiated PRBC.  Platelets  are 16.  No active bleeding.  Hence would not transfuse.  White count is 26.4, predominantly monocytes. -He will proceed with cycle  3 of azacitidine starting today.  We will plan to arrange him for biopsy on 03/28/2020 at [facility]. -He also has an appointment to see Dr. Terrace Arabia for transplant consultation. -RTC 4 weeks with labs.  We will continue to check his labs weekly to see if he needs any transfusions.  2. Thrombocytopenia: -Platelet count is 16.  No bleeding issues.  Hence no transfusion needed.  3. Macrocytic anemia: -Hemoglobin is 6.8.  Will transfuse 2 units of irradiated PRBC.   Orders placed this encounter:  No orders of the defined types were placed in this encounter.    Derek Jack, MD [place] [phone]   I, Milinda Antis, am acting as a scribe for Dr. Sanda Linger.  I, Derek Jack MD, have reviewed the above documentation for accuracy and completeness, and I agree with the above.

## 2020-03-03 NOTE — Progress Notes (Signed)
Pt here for D1C3 for vidaza.  Pt is not having any pain today.  Hemoglobin 6.8, 2 units PBRCs irradiated tomorrow.  Platelets 16.  Labs reviewed with Dr Jill Poling for treatment today.   Pt tolerated treatment well today without incidence.  Discharged ambulatory in stable condition.  Vital signs stable prior to discharge.

## 2020-03-03 NOTE — Patient Instructions (Signed)
[facility] Discharge Instructions for Patients Receiving Chemotherapy  Today you received the following chemotherapy agents   To help prevent nausea and vomiting after your treatment, we encourage you to take your nausea medication   If you develop nausea and vomiting that is not controlled by your nausea medication, call the clinic.   BELOW ARE SYMPTOMS THAT SHOULD BE REPORTED IMMEDIATELY:  *FEVER GREATER THAN 100.5 F  *CHILLS WITH OR WITHOUT FEVER  NAUSEA AND VOMITING THAT IS NOT CONTROLLED WITH YOUR NAUSEA MEDICATION  *UNUSUAL SHORTNESS OF BREATH  *UNUSUAL BRUISING OR BLEEDING  TENDERNESS IN MOUTH AND THROAT WITH OR WITHOUT PRESENCE OF ULCERS  *URINARY PROBLEMS  *BOWEL PROBLEMS  UNUSUAL RASH Items with * indicate a potential emergency and should be followed up as soon as possible.  Feel free to call the clinic should you have any questions or concerns. The clinic phone number is [phone].  Please show the CHEMO ALERT CARD at check-in to the Emergency Department and triage nurse.   

## 2020-03-04 ENCOUNTER — Encounter (HOSPITAL_COMMUNITY): Payer: Self-pay

## 2020-03-04 ENCOUNTER — Inpatient Hospital Stay (HOSPITAL_COMMUNITY): Payer: 59

## 2020-03-04 VITALS — BP 125/76 | HR 74 | Temp 97.6°F | Resp 18

## 2020-03-04 DIAGNOSIS — C9310 Chronic myelomonocytic leukemia not having achieved remission: Secondary | ICD-10-CM | POA: Diagnosis not present

## 2020-03-04 DIAGNOSIS — D46Z Other myelodysplastic syndromes: Secondary | ICD-10-CM

## 2020-03-04 DIAGNOSIS — Z95828 Presence of other vascular implants and grafts: Secondary | ICD-10-CM

## 2020-03-04 MED ORDER — SODIUM CHLORIDE 0.9 % IV SOLN
75.0000 mg/m2 | Freq: Once | INTRAVENOUS | Status: AC
  Administered 2020-03-04: 170 mg via INTRAVENOUS
  Filled 2020-03-04: qty 17

## 2020-03-04 MED ORDER — SODIUM CHLORIDE 0.9% IV SOLUTION
250.0000 mL | Freq: Once | INTRAVENOUS | Status: AC
  Administered 2020-03-04: 250 mL via INTRAVENOUS

## 2020-03-04 MED ORDER — SODIUM CHLORIDE 0.9% FLUSH
10.0000 mL | INTRAVENOUS | Status: DC | PRN
  Administered 2020-03-04: 10 mL

## 2020-03-04 MED ORDER — INFLUENZA VAC SPLIT QUAD 0.5 ML IM SUSY
0.5000 mL | PREFILLED_SYRINGE | Freq: Once | INTRAMUSCULAR | Status: AC
  Administered 2020-03-04: 0.5 mL via INTRAMUSCULAR
  Filled 2020-03-04: qty 0.5

## 2020-03-04 MED ORDER — SODIUM CHLORIDE 0.9 % IV SOLN: Freq: Once | INTRAVENOUS | Status: AC

## 2020-03-04 MED ORDER — HEPARIN SOD (PORK) LOCK FLUSH 100 UNIT/ML IV SOLN
500.0000 [IU] | Freq: Once | INTRAVENOUS | Status: AC | PRN
  Administered 2020-03-04: 500 [IU]

## 2020-03-04 MED ORDER — SODIUM CHLORIDE 0.9 % IV SOLN
10.0000 mg | Freq: Once | INTRAVENOUS | Status: AC
  Administered 2020-03-04: 10 mg via INTRAVENOUS
  Filled 2020-03-04: qty 10

## 2020-03-04 NOTE — Progress Notes (Signed)
Patient presents today for treatment and 2 Units of PRBC's. MAR reviewed. Vital signs stable. Patient denies any significant changes since his last treatment.   Treatment given today per MD orders. 2 Units of PRBC's infused. Tolerated infusions without adverse affects. Vital signs stable. No complaints at this time. Discharged from clinic ambulatory in stable condition. Alert and oriented x 3. F/U with [facility] as scheduled.

## 2020-03-04 NOTE — Patient Instructions (Signed)
[facility] Discharge Instructions for Patients Receiving Chemotherapy  Today you received the following chemotherapy agents   To help prevent nausea and vomiting after your treatment, we encourage you to take your nausea medication   If you develop nausea and vomiting that is not controlled by your nausea medication, call the clinic.   BELOW ARE SYMPTOMS THAT SHOULD BE REPORTED IMMEDIATELY:  *FEVER GREATER THAN 100.5 F  *CHILLS WITH OR WITHOUT FEVER  NAUSEA AND VOMITING THAT IS NOT CONTROLLED WITH YOUR NAUSEA MEDICATION  *UNUSUAL SHORTNESS OF BREATH  *UNUSUAL BRUISING OR BLEEDING  TENDERNESS IN MOUTH AND THROAT WITH OR WITHOUT PRESENCE OF ULCERS  *URINARY PROBLEMS  *BOWEL PROBLEMS  UNUSUAL RASH Items with * indicate a potential emergency and should be followed up as soon as possible.  Feel free to call the clinic should you have any questions or concerns. The clinic phone number is [phone].  Please show the CHEMO ALERT CARD at check-in to the Emergency Department and triage nurse.   

## 2020-03-05 ENCOUNTER — Encounter (HOSPITAL_COMMUNITY): Payer: Self-pay

## 2020-03-05 ENCOUNTER — Other Ambulatory Visit: Payer: Self-pay

## 2020-03-05 ENCOUNTER — Inpatient Hospital Stay (HOSPITAL_COMMUNITY): Payer: 59

## 2020-03-05 VITALS — BP 112/64 | HR 66 | Temp 97.3°F | Resp 18

## 2020-03-05 DIAGNOSIS — C9310 Chronic myelomonocytic leukemia not having achieved remission: Secondary | ICD-10-CM | POA: Diagnosis not present

## 2020-03-05 DIAGNOSIS — D46Z Other myelodysplastic syndromes: Secondary | ICD-10-CM

## 2020-03-05 DIAGNOSIS — Z95828 Presence of other vascular implants and grafts: Secondary | ICD-10-CM

## 2020-03-05 LAB — BPAM RBC
Blood Product Expiration Date: 202111122359
Blood Product Expiration Date: 202111122359
ISSUE DATE / TIME: 202110261311
ISSUE DATE / TIME: 202110261444
Unit Type and Rh: 5100
Unit Type and Rh: 5100

## 2020-03-05 LAB — TYPE AND SCREEN
ABO/RH(D): O POS
Antibody Screen: NEGATIVE
Unit division: 0
Unit division: 0

## 2020-03-05 MED ORDER — SODIUM CHLORIDE 0.9 % IV SOLN
75.0000 mg/m2 | Freq: Once | INTRAVENOUS | Status: AC
  Administered 2020-03-05: 170 mg via INTRAVENOUS
  Filled 2020-03-05: qty 17

## 2020-03-05 MED ORDER — PALONOSETRON HCL INJECTION 0.25 MG/5ML
INTRAVENOUS | Status: AC
  Filled 2020-03-05: qty 5

## 2020-03-05 MED ORDER — SODIUM CHLORIDE 0.9% FLUSH
10.0000 mL | INTRAVENOUS | Status: DC | PRN
  Administered 2020-03-05: 10 mL

## 2020-03-05 MED ORDER — SODIUM CHLORIDE 0.9 % IV SOLN
10.0000 mg | Freq: Once | INTRAVENOUS | Status: AC
  Administered 2020-03-05: 10 mg via INTRAVENOUS
  Filled 2020-03-05: qty 10

## 2020-03-05 MED ORDER — HEPARIN SOD (PORK) LOCK FLUSH 100 UNIT/ML IV SOLN
500.0000 [IU] | Freq: Once | INTRAVENOUS | Status: AC | PRN
  Administered 2020-03-05: 500 [IU]

## 2020-03-05 MED ORDER — SODIUM CHLORIDE 0.9 % IV SOLN: Freq: Once | INTRAVENOUS | Status: AC

## 2020-03-05 MED ORDER — PALONOSETRON HCL INJECTION 0.25 MG/5ML
0.2500 mg | Freq: Once | INTRAVENOUS | Status: AC
  Administered 2020-03-05: 0.25 mg via INTRAVENOUS

## 2020-03-05 NOTE — Progress Notes (Signed)
Patient presents today for treatment. Vital signs within parameters for treatment. Patient denies any changes since his last treatment. Patient states he feels better today after receiving 2 Units of PRBC's yesterday.  Treatment given today per MD orders. Tolerated infusion without adverse affects. Vital signs stable. No complaints at this time. Discharged from clinic ambulatory in stable condition. Alert and oriented x 3. F/U with [facility] as scheduled.

## 2020-03-05 NOTE — Patient Instructions (Signed)
[facility] Discharge Instructions for Patients Receiving Chemotherapy  Today you received the following chemotherapy agents   To help prevent nausea and vomiting after your treatment, we encourage you to take your nausea medication   If you develop nausea and vomiting that is not controlled by your nausea medication, call the clinic.   BELOW ARE SYMPTOMS THAT SHOULD BE REPORTED IMMEDIATELY:  *FEVER GREATER THAN 100.5 F  *CHILLS WITH OR WITHOUT FEVER  NAUSEA AND VOMITING THAT IS NOT CONTROLLED WITH YOUR NAUSEA MEDICATION  *UNUSUAL SHORTNESS OF BREATH  *UNUSUAL BRUISING OR BLEEDING  TENDERNESS IN MOUTH AND THROAT WITH OR WITHOUT PRESENCE OF ULCERS  *URINARY PROBLEMS  *BOWEL PROBLEMS  UNUSUAL RASH Items with * indicate a potential emergency and should be followed up as soon as possible.  Feel free to call the clinic should you have any questions or concerns. The clinic phone number is [phone].  Please show the CHEMO ALERT CARD at check-in to the Emergency Department and triage nurse.   

## 2020-03-06 ENCOUNTER — Encounter (HOSPITAL_COMMUNITY): Payer: Self-pay

## 2020-03-06 ENCOUNTER — Inpatient Hospital Stay (HOSPITAL_COMMUNITY): Payer: 59

## 2020-03-06 VITALS — BP 124/73 | HR 64 | Temp 97.6°F | Resp 18

## 2020-03-06 DIAGNOSIS — D46Z Other myelodysplastic syndromes: Secondary | ICD-10-CM

## 2020-03-06 DIAGNOSIS — C9310 Chronic myelomonocytic leukemia not having achieved remission: Secondary | ICD-10-CM | POA: Diagnosis not present

## 2020-03-06 DIAGNOSIS — Z95828 Presence of other vascular implants and grafts: Secondary | ICD-10-CM

## 2020-03-06 MED ORDER — HEPARIN SOD (PORK) LOCK FLUSH 100 UNIT/ML IV SOLN
500.0000 [IU] | Freq: Once | INTRAVENOUS | Status: AC | PRN
  Administered 2020-03-06: 500 [IU]

## 2020-03-06 MED ORDER — SODIUM CHLORIDE 0.9 % IV SOLN
10.0000 mg | Freq: Once | INTRAVENOUS | Status: AC
  Administered 2020-03-06: 10 mg via INTRAVENOUS
  Filled 2020-03-06: qty 10

## 2020-03-06 MED ORDER — SODIUM CHLORIDE 0.9% FLUSH: 10.0000 mL | INTRAVENOUS | Status: DC | PRN

## 2020-03-06 MED ORDER — SODIUM CHLORIDE 0.9 % IV SOLN: Freq: Once | INTRAVENOUS | Status: AC

## 2020-03-06 MED ORDER — SODIUM CHLORIDE 0.9 % IV SOLN
75.0000 mg/m2 | Freq: Once | INTRAVENOUS | Status: AC
  Administered 2020-03-06: 170 mg via INTRAVENOUS
  Filled 2020-03-06: qty 17

## 2020-03-06 NOTE — Progress Notes (Signed)
Patient presents today for treatment. Vital signs within parameters for treatment today. Patient denies any changes since his last treatment.   Treatment given today per MD orders. Tolerated infusion without adverse affects. Vital signs stable. No complaints at this time. Discharged from clinic ambulatory in stable condition. Alert and oriented x 3. F/U with [facility] as scheduled.

## 2020-03-06 NOTE — Patient Instructions (Signed)
[facility] Discharge Instructions for Patients Receiving Chemotherapy  Today you received the following chemotherapy agents   To help prevent nausea and vomiting after your treatment, we encourage you to take your nausea medication   If you develop nausea and vomiting that is not controlled by your nausea medication, call the clinic.   BELOW ARE SYMPTOMS THAT SHOULD BE REPORTED IMMEDIATELY:  *FEVER GREATER THAN 100.5 F  *CHILLS WITH OR WITHOUT FEVER  NAUSEA AND VOMITING THAT IS NOT CONTROLLED WITH YOUR NAUSEA MEDICATION  *UNUSUAL SHORTNESS OF BREATH  *UNUSUAL BRUISING OR BLEEDING  TENDERNESS IN MOUTH AND THROAT WITH OR WITHOUT PRESENCE OF ULCERS  *URINARY PROBLEMS  *BOWEL PROBLEMS  UNUSUAL RASH Items with * indicate a potential emergency and should be followed up as soon as possible.  Feel free to call the clinic should you have any questions or concerns. The clinic phone number is [phone].  Please show the CHEMO ALERT CARD at check-in to the Emergency Department and triage nurse.   

## 2020-03-07 ENCOUNTER — Other Ambulatory Visit: Payer: Self-pay

## 2020-03-07 ENCOUNTER — Inpatient Hospital Stay (HOSPITAL_COMMUNITY): Payer: 59

## 2020-03-07 ENCOUNTER — Encounter (HOSPITAL_COMMUNITY): Payer: Self-pay

## 2020-03-07 VITALS — BP 137/75 | HR 80 | Temp 97.3°F | Resp 18

## 2020-03-07 DIAGNOSIS — D46Z Other myelodysplastic syndromes: Secondary | ICD-10-CM

## 2020-03-07 DIAGNOSIS — Z95828 Presence of other vascular implants and grafts: Secondary | ICD-10-CM

## 2020-03-07 DIAGNOSIS — C9310 Chronic myelomonocytic leukemia not having achieved remission: Secondary | ICD-10-CM | POA: Diagnosis not present

## 2020-03-07 MED ORDER — SODIUM CHLORIDE 0.9 % IV SOLN
75.0000 mg/m2 | Freq: Once | INTRAVENOUS | Status: AC
  Administered 2020-03-07: 170 mg via INTRAVENOUS
  Filled 2020-03-07: qty 17

## 2020-03-07 MED ORDER — PALONOSETRON HCL INJECTION 0.25 MG/5ML
0.2500 mg | Freq: Once | INTRAVENOUS | Status: AC
  Administered 2020-03-07: 0.25 mg via INTRAVENOUS

## 2020-03-07 MED ORDER — SODIUM CHLORIDE 0.9 % IV SOLN: Freq: Once | INTRAVENOUS | Status: AC

## 2020-03-07 MED ORDER — PALONOSETRON HCL INJECTION 0.25 MG/5ML
INTRAVENOUS | Status: AC
  Filled 2020-03-07: qty 5

## 2020-03-07 MED ORDER — HEPARIN SOD (PORK) LOCK FLUSH 100 UNIT/ML IV SOLN
500.0000 [IU] | Freq: Once | INTRAVENOUS | Status: AC | PRN
  Administered 2020-03-07: 500 [IU]

## 2020-03-07 MED ORDER — SODIUM CHLORIDE 0.9% FLUSH
10.0000 mL | INTRAVENOUS | Status: DC | PRN
  Administered 2020-03-07: 10 mL

## 2020-03-07 MED ORDER — SODIUM CHLORIDE 0.9 % IV SOLN
10.0000 mg | Freq: Once | INTRAVENOUS | Status: AC
  Administered 2020-03-07: 10 mg via INTRAVENOUS
  Filled 2020-03-07: qty 1

## 2020-03-07 NOTE — Progress Notes (Signed)
Treatment given today per MD orders. Tolerated infusion without adverse affects. Vital signs stable. No complaints at this time. Discharged from clinic ambulatory in stable condition. Alert and oriented x 3. F/U with [facility] as scheduled.   

## 2020-03-07 NOTE — Patient Instructions (Signed)
[facility] Discharge Instructions for Patients Receiving Chemotherapy  Today you received the following chemotherapy agents   To help prevent nausea and vomiting after your treatment, we encourage you to take your nausea medication   If you develop nausea and vomiting that is not controlled by your nausea medication, call the clinic.   BELOW ARE SYMPTOMS THAT SHOULD BE REPORTED IMMEDIATELY:  *FEVER GREATER THAN 100.5 F  *CHILLS WITH OR WITHOUT FEVER  NAUSEA AND VOMITING THAT IS NOT CONTROLLED WITH YOUR NAUSEA MEDICATION  *UNUSUAL SHORTNESS OF BREATH  *UNUSUAL BRUISING OR BLEEDING  TENDERNESS IN MOUTH AND THROAT WITH OR WITHOUT PRESENCE OF ULCERS  *URINARY PROBLEMS  *BOWEL PROBLEMS  UNUSUAL RASH Items with * indicate a potential emergency and should be followed up as soon as possible.  Feel free to call the clinic should you have any questions or concerns. The clinic phone number is [phone].  Please show the CHEMO ALERT CARD at check-in to the Emergency Department and triage nurse.   

## 2020-03-10 ENCOUNTER — Encounter (HOSPITAL_COMMUNITY): Payer: Self-pay

## 2020-03-10 ENCOUNTER — Inpatient Hospital Stay (HOSPITAL_COMMUNITY): Payer: 59 | Attending: Hematology

## 2020-03-10 ENCOUNTER — Inpatient Hospital Stay (HOSPITAL_COMMUNITY): Payer: 59

## 2020-03-10 ENCOUNTER — Other Ambulatory Visit: Payer: Self-pay

## 2020-03-10 VITALS — BP 114/66 | HR 78 | Temp 97.2°F | Resp 18

## 2020-03-10 DIAGNOSIS — D46Z Other myelodysplastic syndromes: Secondary | ICD-10-CM

## 2020-03-10 DIAGNOSIS — D696 Thrombocytopenia, unspecified: Secondary | ICD-10-CM | POA: Diagnosis not present

## 2020-03-10 DIAGNOSIS — C9310 Chronic myelomonocytic leukemia not having achieved remission: Secondary | ICD-10-CM | POA: Insufficient documentation

## 2020-03-10 DIAGNOSIS — D469 Myelodysplastic syndrome, unspecified: Secondary | ICD-10-CM | POA: Diagnosis not present

## 2020-03-10 DIAGNOSIS — Z79899 Other long term (current) drug therapy: Secondary | ICD-10-CM | POA: Insufficient documentation

## 2020-03-10 DIAGNOSIS — Z95828 Presence of other vascular implants and grafts: Secondary | ICD-10-CM

## 2020-03-10 DIAGNOSIS — Z5111 Encounter for antineoplastic chemotherapy: Secondary | ICD-10-CM | POA: Insufficient documentation

## 2020-03-10 LAB — COMPREHENSIVE METABOLIC PANEL
ALT: 46 U/L — ABNORMAL HIGH (ref 0–44)
AST: 23 U/L (ref 15–41)
Albumin: 3.6 g/dL (ref 3.5–5.0)
Alkaline Phosphatase: 40 U/L (ref 38–126)
Anion gap: 7 (ref 5–15)
BUN: 22 mg/dL — ABNORMAL HIGH (ref 6–20)
CO2: 26 mmol/L (ref 22–32)
Calcium: 8.9 mg/dL (ref 8.9–10.3)
Chloride: 101 mmol/L (ref 98–111)
Creatinine, Ser: 0.95 mg/dL (ref 0.61–1.24)
GFR, Estimated: >60 mL/min (ref >60)
Glucose, Bld: 118 mg/dL — ABNORMAL HIGH (ref 70–99)
Potassium: 4.3 mmol/L (ref 3.5–5.1)
Sodium: 134 mmol/L — ABNORMAL LOW (ref 135–145)
Total Bilirubin: 0.6 mg/dL (ref 0.3–1.2)
Total Protein: 6.4 g/dL — ABNORMAL LOW (ref 6.5–8.1)

## 2020-03-10 LAB — CBC WITH DIFFERENTIAL/PLATELET
Band Neutrophils: 16 %
Basophils Absolute: 0 10*3/uL (ref 0.0–0.1)
Basophils Relative: 0 %
Eosinophils Absolute: 0.1 10*3/uL (ref 0.0–0.5)
Eosinophils Relative: 1 %
HCT: 24.7 % — ABNORMAL LOW (ref 39.0–52.0)
Hemoglobin: 8.3 g/dL — ABNORMAL LOW (ref 13.0–17.0)
Lymphocytes Relative: 35 %
Lymphs Abs: 4.8 10*3/uL — ABNORMAL HIGH (ref 0.7–4.0)
MCH: 31.2 pg (ref 26.0–34.0)
MCHC: 33.6 g/dL (ref 30.0–36.0)
MCV: 92.9 fL (ref 80.0–100.0)
Metamyelocytes Relative: 10 %
Monocytes Absolute: 1.6 10*3/uL — ABNORMAL HIGH (ref 0.1–1.0)
Monocytes Relative: 12 %
Myelocytes: 7 %
Neutro Abs: 4.1 10*3/uL (ref 1.7–7.7)
Neutrophils Relative %: 14 %
Platelets: 16 10*3/uL — CL (ref 150–400)
Promyelocytes Relative: 5 %
RBC: 2.66 MIL/uL — ABNORMAL LOW (ref 4.22–5.81)
RDW: 16.6 % — ABNORMAL HIGH (ref 11.5–15.5)
WBC: 13.7 10*3/uL — ABNORMAL HIGH (ref 4.0–10.5)
nRBC: 0 % (ref 0.0–0.2)

## 2020-03-10 LAB — LACTATE DEHYDROGENASE: LDH: 218 U/L — ABNORMAL HIGH (ref 98–192)

## 2020-03-10 MED ORDER — SODIUM CHLORIDE 0.9 % IV SOLN
75.0000 mg/m2 | Freq: Once | INTRAVENOUS | Status: AC
  Administered 2020-03-10: 170 mg via INTRAVENOUS
  Filled 2020-03-10: qty 17

## 2020-03-10 MED ORDER — SODIUM CHLORIDE 0.9 % IV SOLN
10.0000 mg | Freq: Once | INTRAVENOUS | Status: AC
  Administered 2020-03-10: 10 mg via INTRAVENOUS
  Filled 2020-03-10: qty 10

## 2020-03-10 MED ORDER — SODIUM CHLORIDE 0.9% FLUSH
10.0000 mL | INTRAVENOUS | Status: DC | PRN
  Administered 2020-03-10: 10 mL

## 2020-03-10 MED ORDER — HEPARIN SOD (PORK) LOCK FLUSH 100 UNIT/ML IV SOLN
500.0000 [IU] | Freq: Once | INTRAVENOUS | Status: AC | PRN
  Administered 2020-03-10: 500 [IU]

## 2020-03-10 MED ORDER — SODIUM CHLORIDE 0.9 % IV SOLN: Freq: Once | INTRAVENOUS | Status: AC

## 2020-03-10 MED ORDER — PALONOSETRON HCL INJECTION 0.25 MG/5ML
0.2500 mg | Freq: Once | INTRAVENOUS | Status: AC
  Administered 2020-03-10: 0.25 mg via INTRAVENOUS
  Filled 2020-03-10: qty 5

## 2020-03-10 NOTE — Progress Notes (Signed)
CRITICAL VALUE ALERT Critical value received:  Platelets 16 Date of notification:  03/10/20 Time of notification: 3582 Critical value read back:  yes Nurse who received alert:  TWJackson RN MD notified time and response:  5189 pt to receive Vidaza infusion today. No platelets needed

## 2020-03-10 NOTE — Patient Instructions (Signed)
[facility] Discharge Instructions  Labs drawn from portacath today   Thank you for choosing  Cancer Center at [facility] to provide your oncology and hematology care.  To afford each patient quality time with our provider, please arrive at least 15 minutes before your scheduled appointment time.   If you have a lab appointment with the Cancer Center please come in thru the Main Entrance and check in at the main information desk.  You need to re-schedule your appointment should you arrive 10 or more minutes late.  We strive to give you quality time with our providers, and arriving late affects you and other patients whose appointments are after yours.  Also, if you no show three or more times for appointments you may be dismissed from the clinic at the providers discretion.     Again, thank you for choosing [facility].  Our hope is that these requests will decrease the amount of time that you wait before being seen by our physicians.       _____________________________________________________________  Should you have questions after your visit to [facility], please contact our office at [phone] and follow the prompts.  Our office hours are 8:00 a.m. and 4:30 p.m. Monday - Friday.  Please note that voicemails left after 4:00 p.m. may not be returned until the following business day.  We are closed weekends and major holidays.  You do have access to a nurse 24-7, just call the main number to the clinic [phone] and do not press any options, hold on the line and a nurse will answer the phone.    For prescription refill requests, have your pharmacy contact our office and allow 72 hours.    Due to Covid, you will need to wear a mask upon entering the hospital. If you do not have a mask, a mask will be given to you at the Main Entrance upon arrival. For doctor visits, patients may have 1 support person age 18  or older with them. For treatment visits, patients can not have anyone with them due to social distancing guidelines and our immunocompromised population.     

## 2020-03-10 NOTE — Patient Instructions (Signed)
[facility]  Discharge Instructions:   _______________________________________________________________  Thank you for choosing [facility] to provide your oncology and hematology care.  To afford each patient quality time with our providers, please arrive at least 15 minutes before your scheduled appointment.  You need to re-schedule your appointment if you arrive 10 or more minutes late.  We strive to give you quality time with our providers, and arriving late affects you and other patients whose appointments are after yours.  Also, if you no show three or more times for appointments you may be dismissed from the clinic.  Again, thank you for choosing [facility]. Our hope is that these requests will allow you access to exceptional care and in a timely manner. _______________________________________________________________  If you have questions after your visit, please contact our office at [phone] between the hours of 8:30 a.m. and 5:00 p.m. Voicemails left after 4:30 p.m. will not be returned until the following business day. _______________________________________________________________  For prescription refill requests, have your pharmacy contact our office. _______________________________________________________________  Recommendations made by the consultant and any test results will be sent to your referring physician. _______________________________________________________________ 

## 2020-03-10 NOTE — Progress Notes (Signed)
Patient presents today for treatment Day 6 Vidaza. Labs pending. Patient has no complaints of any significant changes since his last visit. Vital signs within parameters for treatment. MAR reviewed.   Treatment given today per MD orders. Tolerated infusion without adverse affects. Vital signs stable. No complaints at this time. Discharged from clinic ambulatory in stable condition. Alert and oriented x 3. F/U with [facility] as scheduled.

## 2020-03-11 ENCOUNTER — Inpatient Hospital Stay (HOSPITAL_COMMUNITY): Payer: 59

## 2020-03-11 ENCOUNTER — Encounter (HOSPITAL_COMMUNITY): Payer: Self-pay

## 2020-03-11 VITALS — BP 124/70 | HR 75 | Temp 97.3°F | Resp 18

## 2020-03-11 DIAGNOSIS — Z95828 Presence of other vascular implants and grafts: Secondary | ICD-10-CM

## 2020-03-11 DIAGNOSIS — D46Z Other myelodysplastic syndromes: Secondary | ICD-10-CM

## 2020-03-11 DIAGNOSIS — C9310 Chronic myelomonocytic leukemia not having achieved remission: Secondary | ICD-10-CM | POA: Diagnosis not present

## 2020-03-11 LAB — PATHOLOGIST SMEAR REVIEW

## 2020-03-11 MED ORDER — HEPARIN SOD (PORK) LOCK FLUSH 100 UNIT/ML IV SOLN
500.0000 [IU] | Freq: Once | INTRAVENOUS | Status: AC | PRN
  Administered 2020-03-11: 500 [IU]

## 2020-03-11 MED ORDER — SODIUM CHLORIDE 0.9 % IV SOLN
75.0000 mg/m2 | Freq: Once | INTRAVENOUS | Status: AC
  Administered 2020-03-11: 170 mg via INTRAVENOUS
  Filled 2020-03-11: qty 17

## 2020-03-11 MED ORDER — SODIUM CHLORIDE 0.9 % IV SOLN: Freq: Once | INTRAVENOUS | Status: AC

## 2020-03-11 MED ORDER — SODIUM CHLORIDE 0.9% FLUSH
10.0000 mL | INTRAVENOUS | Status: DC | PRN
  Administered 2020-03-11: 10 mL

## 2020-03-11 MED ORDER — SODIUM CHLORIDE 0.9 % IV SOLN
10.0000 mg | Freq: Once | INTRAVENOUS | Status: AC
  Administered 2020-03-11: 10 mg via INTRAVENOUS
  Filled 2020-03-11: qty 10

## 2020-03-11 NOTE — Progress Notes (Signed)
Patient presents today for treatment. Day 7. Vital signs within parameters for treatment. Patient denies any changes since his last visit. Patient denies pain today. MAR reviewed.   Treatment given today per MD orders. Tolerated infusion without adverse affects. Vital signs stable. No complaints at this time. Discharged from clinic ambulatory in stable condition. Alert and oriented x 3. F/U with [facility] as scheduled.

## 2020-03-11 NOTE — Patient Instructions (Signed)
[facility] Discharge Instructions for Patients Receiving Chemotherapy  Today you received the following chemotherapy agents   To help prevent nausea and vomiting after your treatment, we encourage you to take your nausea medication   If you develop nausea and vomiting that is not controlled by your nausea medication, call the clinic.   BELOW ARE SYMPTOMS THAT SHOULD BE REPORTED IMMEDIATELY:  *FEVER GREATER THAN 100.5 F  *CHILLS WITH OR WITHOUT FEVER  NAUSEA AND VOMITING THAT IS NOT CONTROLLED WITH YOUR NAUSEA MEDICATION  *UNUSUAL SHORTNESS OF BREATH  *UNUSUAL BRUISING OR BLEEDING  TENDERNESS IN MOUTH AND THROAT WITH OR WITHOUT PRESENCE OF ULCERS  *URINARY PROBLEMS  *BOWEL PROBLEMS  UNUSUAL RASH Items with * indicate a potential emergency and should be followed up as soon as possible.  Feel free to call the clinic should you have any questions or concerns. The clinic phone number is [phone].  Please show the CHEMO ALERT CARD at check-in to the Emergency Department and triage nurse.   

## 2020-03-18 ENCOUNTER — Inpatient Hospital Stay (HOSPITAL_COMMUNITY): Payer: 59

## 2020-03-18 ENCOUNTER — Encounter (HOSPITAL_COMMUNITY): Payer: Self-pay

## 2020-03-18 ENCOUNTER — Other Ambulatory Visit: Payer: Self-pay

## 2020-03-18 DIAGNOSIS — C9310 Chronic myelomonocytic leukemia not having achieved remission: Secondary | ICD-10-CM | POA: Diagnosis not present

## 2020-03-18 DIAGNOSIS — D46Z Other myelodysplastic syndromes: Secondary | ICD-10-CM

## 2020-03-18 LAB — CBC WITH DIFFERENTIAL/PLATELET
Band Neutrophils: 3 %
Basophils Absolute: 0 10*3/uL (ref 0.0–0.1)
Basophils Relative: 1 %
Blasts: 1 %
Eosinophils Absolute: 0.3 10*3/uL (ref 0.0–0.5)
Eosinophils Relative: 6 %
HCT: 18.9 % — ABNORMAL LOW (ref 39.0–52.0)
Hemoglobin: 6 g/dL — CL (ref 13.0–17.0)
Lymphocytes Relative: 37 %
Lymphs Abs: 1.6 10*3/uL (ref 0.7–4.0)
MCH: 30.6 pg (ref 26.0–34.0)
MCHC: 31.7 g/dL (ref 30.0–36.0)
MCV: 96.4 fL (ref 80.0–100.0)
Metamyelocytes Relative: 11 %
Monocytes Absolute: 1 10*3/uL (ref 0.1–1.0)
Monocytes Relative: 22 %
Myelocytes: 3 %
Neutro Abs: 0.8 10*3/uL — ABNORMAL LOW (ref 1.7–7.7)
Neutrophils Relative %: 15 %
Platelets: 8 10*3/uL — CL (ref 150–400)
Promyelocytes Relative: 1 %
RBC: 1.96 MIL/uL — ABNORMAL LOW (ref 4.22–5.81)
RDW: 16.1 % — ABNORMAL HIGH (ref 11.5–15.5)
WBC: 4.4 10*3/uL (ref 4.0–10.5)
nRBC: 0 % (ref 0.0–0.2)

## 2020-03-18 LAB — COMPREHENSIVE METABOLIC PANEL
ALT: 40 U/L (ref 0–44)
AST: 21 U/L (ref 15–41)
Albumin: 3.4 g/dL — ABNORMAL LOW (ref 3.5–5.0)
Alkaline Phosphatase: 37 U/L — ABNORMAL LOW (ref 38–126)
Anion gap: 6 (ref 5–15)
BUN: 16 mg/dL (ref 6–20)
CO2: 25 mmol/L (ref 22–32)
Calcium: 8.8 mg/dL — ABNORMAL LOW (ref 8.9–10.3)
Chloride: 106 mmol/L (ref 98–111)
Creatinine, Ser: 0.94 mg/dL (ref 0.61–1.24)
GFR, Estimated: >60 mL/min (ref >60)
Glucose, Bld: 114 mg/dL — ABNORMAL HIGH (ref 70–99)
Potassium: 4.2 mmol/L (ref 3.5–5.1)
Sodium: 137 mmol/L (ref 135–145)
Total Bilirubin: 0.6 mg/dL (ref 0.3–1.2)
Total Protein: 6.2 g/dL — ABNORMAL LOW (ref 6.5–8.1)

## 2020-03-18 LAB — LACTATE DEHYDROGENASE: LDH: 149 U/L (ref 98–192)

## 2020-03-18 LAB — PREPARE RBC (CROSSMATCH)

## 2020-03-18 LAB — SAMPLE TO BLOOD BANK

## 2020-03-18 NOTE — Progress Notes (Unsigned)
CRITICAL VALUE ALERT  Critical Value:  hgb 6.0; platelets 8  Date & Time Notied:  03/18/2020 at [place]  Provider Notified: Dr. Delton Coombes  Orders Received/Actions taken: transfuse 2 units irradiated PRBC and 1 unit irradiated platelets

## 2020-03-18 NOTE — Progress Notes (Signed)
No complaints at this time. Discharged from clinic ambulatory in stable condition. Alert and oriented x 3. F/U with [facility] as scheduled. Patient to return tomorrow for 2 Units of PRBC's and 1 Unit of Platelets. Understanding verbalized.

## 2020-03-18 NOTE — Patient Instructions (Signed)
[facility] at [facility] Discharge Instructions  Labs drawn from portacath today   Thank you for choosing [facility] at [facility] to provide your oncology and hematology care.  To afford each patient quality time with our provider, please arrive at least 15 minutes before your scheduled appointment time.   If you have a lab appointment with the Cancer Center please come in thru the Main Entrance and check in at the main information desk.  You need to re-schedule your appointment should you arrive 10 or more minutes late.  We strive to give you quality time with our providers, and arriving late affects you and other patients whose appointments are after yours.  Also, if you no show three or more times for appointments you may be dismissed from the clinic at the providers discretion.     Again, thank you for choosing  Cancer Center.  Our hope is that these requests will decrease the amount of time that you wait before being seen by our physicians.       _____________________________________________________________  Should you have questions after your visit to  Cancer Center, please contact our office at [phone] and follow the prompts.  Our office hours are 8:00 a.m. and 4:30 p.m. Monday - Friday.  Please note that voicemails left after 4:00 p.m. may not be returned until the following business day.  We are closed weekends and major holidays.  You do have access to a nurse 24-7, just call the main number to the clinic [phone] and do not press any options, hold on the line and a nurse will answer the phone.    For prescription refill requests, have your pharmacy contact our office and allow 72 hours.    Due to Covid, you will need to wear a mask upon entering the hospital. If you do not have a mask, a mask will be given to you at the Main Entrance upon arrival. For doctor visits, patients may have 1 support person age 18  or older with them. For treatment visits, patients can not have anyone with them due to social distancing guidelines and our immunocompromised population.     

## 2020-03-19 ENCOUNTER — Inpatient Hospital Stay (HOSPITAL_COMMUNITY): Payer: 59

## 2020-03-19 ENCOUNTER — Encounter (HOSPITAL_COMMUNITY): Payer: Self-pay

## 2020-03-19 DIAGNOSIS — D696 Thrombocytopenia, unspecified: Secondary | ICD-10-CM

## 2020-03-19 DIAGNOSIS — D649 Anemia, unspecified: Secondary | ICD-10-CM

## 2020-03-19 DIAGNOSIS — C9310 Chronic myelomonocytic leukemia not having achieved remission: Secondary | ICD-10-CM | POA: Diagnosis not present

## 2020-03-19 DIAGNOSIS — D46Z Other myelodysplastic syndromes: Secondary | ICD-10-CM

## 2020-03-19 MED ORDER — SODIUM CHLORIDE 0.9% FLUSH
10.0000 mL | INTRAVENOUS | Status: AC | PRN
  Administered 2020-03-19: 10 mL

## 2020-03-19 MED ORDER — HEPARIN SOD (PORK) LOCK FLUSH 100 UNIT/ML IV SOLN
500.0000 [IU] | Freq: Every day | INTRAVENOUS | Status: AC | PRN
  Administered 2020-03-19: 500 [IU]

## 2020-03-19 MED ORDER — SODIUM CHLORIDE 0.9% IV SOLUTION
250.0000 mL | Freq: Once | INTRAVENOUS | Status: AC
  Administered 2020-03-19: 250 mL via INTRAVENOUS

## 2020-03-19 NOTE — Progress Notes (Signed)
Patient presents today for 2 Units of PRBC's and 1 Unit of Platelets. Vital signs stable. Patient has no complaints of any changes since his last visit. Patient declines Tylenol and Benadryl. Per Dr. Delton Coombes , may infuse blood products without premedicating.   1 unit of platelets and 2 units of PRBC's given today per MD orders. Tolerated infusion without adverse affects. Vital signs stable. No complaints at this time. Discharged from clinic ambulatory in stable condition. Alert and oriented x 3. F/U with [facility] as scheduled.

## 2020-03-19 NOTE — Patient Instructions (Signed)
[facility] at [facility]  Discharge Instructions:   _______________________________________________________________  Thank you for choosing [facility] at [facility] to provide your oncology and hematology care.  To afford each patient quality time with our providers, please arrive at least 15 minutes before your scheduled appointment.  You need to re-schedule your appointment if you arrive 10 or more minutes late.  We strive to give you quality time with our providers, and arriving late affects you and other patients whose appointments are after yours.  Also, if you no show three or more times for appointments you may be dismissed from the clinic.  Again, thank you for choosing [facility] at [facility]. Our hope is that these requests will allow you access to exceptional care and in a timely manner. _______________________________________________________________  If you have questions after your visit, please contact our office at [phone] between the hours of 8:30 a.m. and 5:00 p.m. Voicemails left after 4:30 p.m. will not be returned until the following business day. _______________________________________________________________  For prescription refill requests, have your pharmacy contact our office. _______________________________________________________________  Recommendations made by the consultant and any test results will be sent to your referring physician. _______________________________________________________________ 

## 2020-03-20 LAB — BPAM RBC
Blood Product Expiration Date: 202112052359
Blood Product Expiration Date: 202112052359
ISSUE DATE / TIME: 202111101000
ISSUE DATE / TIME: 202111101141
Unit Type and Rh: 5100
Unit Type and Rh: 5100

## 2020-03-20 LAB — BPAM PLATELET PHERESIS
Blood Product Expiration Date: 202111112359
ISSUE DATE / TIME: 202111100835
Unit Type and Rh: 6200

## 2020-03-20 LAB — PREPARE PLATELET PHERESIS: Unit division: 0

## 2020-03-20 LAB — TYPE AND SCREEN
ABO/RH(D): O POS
Antibody Screen: NEGATIVE
Unit division: 0
Unit division: 0

## 2020-03-24 ENCOUNTER — Inpatient Hospital Stay (HOSPITAL_COMMUNITY): Payer: 59

## 2020-03-24 ENCOUNTER — Other Ambulatory Visit: Payer: Self-pay

## 2020-03-24 DIAGNOSIS — D46Z Other myelodysplastic syndromes: Secondary | ICD-10-CM

## 2020-03-24 DIAGNOSIS — Z95828 Presence of other vascular implants and grafts: Secondary | ICD-10-CM

## 2020-03-24 DIAGNOSIS — C9310 Chronic myelomonocytic leukemia not having achieved remission: Secondary | ICD-10-CM | POA: Diagnosis not present

## 2020-03-24 LAB — COMPREHENSIVE METABOLIC PANEL
ALT: 41 U/L (ref 0–44)
AST: 26 U/L (ref 15–41)
Albumin: 3.5 g/dL (ref 3.5–5.0)
Alkaline Phosphatase: 40 U/L (ref 38–126)
Anion gap: 7 (ref 5–15)
BUN: 15 mg/dL (ref 6–20)
CO2: 24 mmol/L (ref 22–32)
Calcium: 8.6 mg/dL — ABNORMAL LOW (ref 8.9–10.3)
Chloride: 108 mmol/L (ref 98–111)
Creatinine, Ser: 0.94 mg/dL (ref 0.61–1.24)
GFR, Estimated: >60 mL/min (ref >60)
Glucose, Bld: 119 mg/dL — ABNORMAL HIGH (ref 70–99)
Potassium: 4.3 mmol/L (ref 3.5–5.1)
Sodium: 139 mmol/L (ref 135–145)
Total Bilirubin: 0.5 mg/dL (ref 0.3–1.2)
Total Protein: 6.3 g/dL — ABNORMAL LOW (ref 6.5–8.1)

## 2020-03-24 LAB — CBC WITH DIFFERENTIAL/PLATELET
Band Neutrophils: 9 %
Basophils Absolute: 0 10*3/uL (ref 0.0–0.1)
Basophils Relative: 0 %
Eosinophils Absolute: 0.6 10*3/uL — ABNORMAL HIGH (ref 0.0–0.5)
Eosinophils Relative: 8 %
HCT: 23.4 % — ABNORMAL LOW (ref 39.0–52.0)
Hemoglobin: 7.4 g/dL — ABNORMAL LOW (ref 13.0–17.0)
Lymphocytes Relative: 32 %
Lymphs Abs: 2.4 10*3/uL (ref 0.7–4.0)
MCH: 29.4 pg (ref 26.0–34.0)
MCHC: 31.6 g/dL (ref 30.0–36.0)
MCV: 92.9 fL (ref 80.0–100.0)
Metamyelocytes Relative: 1 %
Monocytes Absolute: 2.7 10*3/uL — ABNORMAL HIGH (ref 0.1–1.0)
Monocytes Relative: 37 %
Myelocytes: 5 %
Neutro Abs: 1.2 10*3/uL — ABNORMAL LOW (ref 1.7–7.7)
Neutrophils Relative %: 7 %
Platelets: 14 10*3/uL — CL (ref 150–400)
Promyelocytes Relative: 1 %
RBC: 2.52 MIL/uL — ABNORMAL LOW (ref 4.22–5.81)
RDW: 15.5 % (ref 11.5–15.5)
WBC: 7.4 10*3/uL (ref 4.0–10.5)
nRBC: 0 % (ref 0.0–0.2)

## 2020-03-24 LAB — SAMPLE TO BLOOD BANK

## 2020-03-24 LAB — LACTATE DEHYDROGENASE: LDH: 204 U/L — ABNORMAL HIGH (ref 98–192)

## 2020-03-24 MED ORDER — SODIUM CHLORIDE 0.9% FLUSH
10.0000 mL | Freq: Once | INTRAVENOUS | Status: AC
  Administered 2020-03-24: 10 mL via INTRAVENOUS

## 2020-03-24 MED ORDER — HEPARIN SOD (PORK) LOCK FLUSH 100 UNIT/ML IV SOLN
500.0000 [IU] | Freq: Once | INTRAVENOUS | Status: AC
  Administered 2020-03-24: 500 [IU] via INTRAVENOUS

## 2020-03-24 NOTE — Progress Notes (Signed)
Hgb 7.4, Platelets 17. Reported labs to Dr. Delton Coombes / DWilson RN. Patient asymptomatic. Patient states he has an appointment 03/25/20 with Dr. Florene Glen and biopsy. Patient returns Monday 03/31/20 for labs and possible blood products.

## 2020-03-24 NOTE — Addendum Note (Signed)
Addended by: Fayne Norrie on: 03/24/2020 10:38 AM   Modules accepted: Orders, SmartSet

## 2020-03-24 NOTE — Progress Notes (Signed)
Patient presented today for possible blood products.  Vital signs within parameters.  No new complaints.  Labs pending.  Patient scheduled for blood products on Wednesday.   No complaints at this time.  Discharge from clinic ambulatory in stable condition.  Alert and oriented X 3.  Follow up with [facility] as scheduled.

## 2020-03-26 ENCOUNTER — Encounter (HOSPITAL_COMMUNITY): Payer: 59

## 2020-03-28 LAB — TYPE AND SCREEN
ABO/RH(D): O POS
Antibody Screen: NEGATIVE
Unit division: 0

## 2020-03-28 LAB — BPAM RBC
Blood Product Expiration Date: 202111302359
Unit Type and Rh: 9500

## 2020-03-31 ENCOUNTER — Inpatient Hospital Stay (HOSPITAL_COMMUNITY): Payer: 59

## 2020-03-31 ENCOUNTER — Other Ambulatory Visit: Payer: Self-pay

## 2020-03-31 ENCOUNTER — Encounter (HOSPITAL_COMMUNITY): Payer: Self-pay

## 2020-03-31 VITALS — BP 128/85 | HR 79 | Temp 96.8°F | Resp 18

## 2020-03-31 DIAGNOSIS — D46Z Other myelodysplastic syndromes: Secondary | ICD-10-CM

## 2020-03-31 DIAGNOSIS — C9310 Chronic myelomonocytic leukemia not having achieved remission: Secondary | ICD-10-CM | POA: Diagnosis not present

## 2020-03-31 DIAGNOSIS — D649 Anemia, unspecified: Secondary | ICD-10-CM

## 2020-03-31 LAB — CBC WITH DIFFERENTIAL/PLATELET
Band Neutrophils: 5 %
Basophils Absolute: 0.2 10*3/uL — ABNORMAL HIGH (ref 0.0–0.1)
Basophils Relative: 1 %
Blasts: 5 %
Eosinophils Absolute: 0.5 10*3/uL (ref 0.0–0.5)
Eosinophils Relative: 2 %
HCT: 22.8 % — ABNORMAL LOW (ref 39.0–52.0)
Hemoglobin: 7.3 g/dL — ABNORMAL LOW (ref 13.0–17.0)
Lymphocytes Relative: 20 %
Lymphs Abs: 4.9 10*3/uL — ABNORMAL HIGH (ref 0.7–4.0)
MCH: 30.3 pg (ref 26.0–34.0)
MCHC: 32 g/dL (ref 30.0–36.0)
MCV: 94.6 fL (ref 80.0–100.0)
Metamyelocytes Relative: 8 %
Monocytes Absolute: 4.4 10*3/uL — ABNORMAL HIGH (ref 0.1–1.0)
Monocytes Relative: 18 %
Myelocytes: 12 %
Neutro Abs: 7.9 10*3/uL — ABNORMAL HIGH (ref 1.7–7.7)
Neutrophils Relative %: 27 %
Platelets: 17 10*3/uL — CL (ref 150–400)
Promyelocytes Relative: 2 %
RBC: 2.41 MIL/uL — ABNORMAL LOW (ref 4.22–5.81)
RDW: 15 % (ref 11.5–15.5)
WBC: 24.6 10*3/uL — ABNORMAL HIGH (ref 4.0–10.5)
nRBC: 0 % (ref 0.0–0.2)

## 2020-03-31 LAB — PREPARE RBC (CROSSMATCH)

## 2020-03-31 LAB — SAMPLE TO BLOOD BANK

## 2020-03-31 MED ORDER — SODIUM CHLORIDE 0.9% FLUSH
10.0000 mL | INTRAVENOUS | Status: AC | PRN
  Administered 2020-03-31: 10 mL

## 2020-03-31 MED ORDER — ACETAMINOPHEN 325 MG PO TABS: 650.0000 mg | ORAL_TABLET | Freq: Once | ORAL | Status: DC

## 2020-03-31 MED ORDER — SODIUM CHLORIDE 0.9% IV SOLUTION
250.0000 mL | Freq: Once | INTRAVENOUS | Status: AC
  Administered 2020-03-31: 250 mL via INTRAVENOUS

## 2020-03-31 MED ORDER — HEPARIN SOD (PORK) LOCK FLUSH 100 UNIT/ML IV SOLN
500.0000 [IU] | Freq: Every day | INTRAVENOUS | Status: AC | PRN
  Administered 2020-03-31: 500 [IU]

## 2020-03-31 NOTE — Patient Instructions (Signed)
[facility] at [facility] Discharge Instructions  Labs drawn from portacath today   Thank you for choosing [facility] at [facility] to provide your oncology and hematology care.  To afford each patient quality time with our provider, please arrive at least 15 minutes before your scheduled appointment time.   If you have a lab appointment with the Cancer Center please come in thru the Main Entrance and check in at the main information desk.  You need to re-schedule your appointment should you arrive 10 or more minutes late.  We strive to give you quality time with our providers, and arriving late affects you and other patients whose appointments are after yours.  Also, if you no show three or more times for appointments you may be dismissed from the clinic at the providers discretion.     Again, thank you for choosing [facility].  Our hope is that these requests will decrease the amount of time that you wait before being seen by our physicians.       _____________________________________________________________  Should you have questions after your visit to [facility], please contact our office at [phone] and follow the prompts.  Our office hours are 8:00 a.m. and 4:30 p.m. Monday - Friday.  Please note that voicemails left after 4:00 p.m. may not be returned until the following business day.  We are closed weekends and major holidays.  You do have access to a nurse 24-7, just call the main number to the clinic [phone] and do not press any options, hold on the line and a nurse will answer the phone.    For prescription refill requests, have your pharmacy contact our office and allow 72 hours.    Due to Covid, you will need to wear a mask upon entering the hospital. If you do not have a mask, a mask will be given to you at the Main Entrance upon arrival. For doctor visits, patients may have 1 support person age 18  or older with them. For treatment visits, patients can not have anyone with them due to social distancing guidelines and our immunocompromised population.     

## 2020-03-31 NOTE — Progress Notes (Signed)
CRITICAL VALUE ALERT  Critical Value:  Platelets 17  Date & Time Notied:  03/31/2020 at [place]  Provider Notified: Dr. Delton Coombes  Orders Received/Actions taken: n/a; transfuse 1 unit PRBC for symptomatic anemia (hgb 7.3).

## 2020-03-31 NOTE — Progress Notes (Signed)
Patient presents today for lab work and possible blood products. Vital signs stable. Hgb 7.3, Platelets 17. Patient has no complaints of any significant changes since his last visit. MAR reviewed.

## 2020-03-31 NOTE — Progress Notes (Signed)
One Unit of Packed Red Blood Cells given today per MD orders.  Tolerated infusion without adverse affects.  Vital signs stable.  No complaints at this time.  Discharge from clinic ambulatory in stable condition.  Alert and oriented X 3.  Follow up with [facility] as scheduled.

## 2020-03-31 NOTE — Patient Instructions (Signed)
[place] Discharge Instructions for Patients Receiving Blood Products  Today you received one Unit of Packed Red Blood Cells To help prevent nausea and vomiting after your treatment, we encourage you to take your nausea medication    If you develop nausea and vomiting that is not controlled by your nausea medication, call the clinic.   BELOW ARE SYMPTOMS THAT SHOULD BE REPORTED IMMEDIATELY:  *FEVER GREATER THAN 100.5 F  *CHILLS WITH OR WITHOUT FEVER  NAUSEA AND VOMITING THAT IS NOT CONTROLLED WITH YOUR NAUSEA MEDICATION  *UNUSUAL SHORTNESS OF BREATH  *UNUSUAL BRUISING OR BLEEDING  TENDERNESS IN MOUTH AND THROAT WITH OR WITHOUT PRESENCE OF ULCERS  *URINARY PROBLEMS  *BOWEL PROBLEMS  UNUSUAL RASH Items with * indicate a potential emergency and should be followed up as soon as possible.  Feel free to call the clinic should you have any questions or concerns. The clinic phone number is [phone].  Please show the [place] at check-in to the Emergency Department and triage nurse.

## 2020-04-01 LAB — TYPE AND SCREEN
ABO/RH(D): O POS
Antibody Screen: NEGATIVE
Unit division: 0

## 2020-04-01 LAB — BPAM RBC
Blood Product Expiration Date: 202111302359
ISSUE DATE / TIME: 202111221026
Unit Type and Rh: 9500

## 2020-04-07 ENCOUNTER — Encounter (HOSPITAL_COMMUNITY): Payer: Self-pay | Admitting: Hematology

## 2020-04-07 ENCOUNTER — Inpatient Hospital Stay (HOSPITAL_COMMUNITY): Payer: 59

## 2020-04-07 ENCOUNTER — Inpatient Hospital Stay (HOSPITAL_BASED_OUTPATIENT_CLINIC_OR_DEPARTMENT_OTHER): Payer: 59 | Admitting: Hematology

## 2020-04-07 ENCOUNTER — Other Ambulatory Visit: Payer: Self-pay

## 2020-04-07 VITALS — BP 116/62 | HR 77 | Temp 98.0°F | Resp 18

## 2020-04-07 VITALS — BP 145/77 | HR 90 | Temp 97.3°F | Resp 18 | Wt 230.2 lb

## 2020-04-07 DIAGNOSIS — D696 Thrombocytopenia, unspecified: Secondary | ICD-10-CM

## 2020-04-07 DIAGNOSIS — C9310 Chronic myelomonocytic leukemia not having achieved remission: Secondary | ICD-10-CM

## 2020-04-07 DIAGNOSIS — D649 Anemia, unspecified: Secondary | ICD-10-CM

## 2020-04-07 DIAGNOSIS — D46Z Other myelodysplastic syndromes: Secondary | ICD-10-CM

## 2020-04-07 DIAGNOSIS — D539 Nutritional anemia, unspecified: Secondary | ICD-10-CM | POA: Diagnosis not present

## 2020-04-07 DIAGNOSIS — Z95828 Presence of other vascular implants and grafts: Secondary | ICD-10-CM

## 2020-04-07 LAB — COMPREHENSIVE METABOLIC PANEL
ALT: 66 U/L — ABNORMAL HIGH (ref 0–44)
AST: 35 U/L (ref 15–41)
Albumin: 3.8 g/dL (ref 3.5–5.0)
Alkaline Phosphatase: 41 U/L (ref 38–126)
Anion gap: 6 (ref 5–15)
BUN: 19 mg/dL (ref 6–20)
CO2: 25 mmol/L (ref 22–32)
Calcium: 8.9 mg/dL (ref 8.9–10.3)
Chloride: 105 mmol/L (ref 98–111)
Creatinine, Ser: 0.92 mg/dL (ref 0.61–1.24)
GFR, Estimated: >60 mL/min (ref >60)
Glucose, Bld: 113 mg/dL — ABNORMAL HIGH (ref 70–99)
Potassium: 4.1 mmol/L (ref 3.5–5.1)
Sodium: 136 mmol/L (ref 135–145)
Total Bilirubin: 0.7 mg/dL (ref 0.3–1.2)
Total Protein: 6.7 g/dL (ref 6.5–8.1)

## 2020-04-07 LAB — CBC WITH DIFFERENTIAL/PLATELET
Abs Immature Granulocytes: 1.55 10*3/uL — ABNORMAL HIGH (ref 0.00–0.07)
Basophils Absolute: 0.1 10*3/uL (ref 0.0–0.1)
Basophils Relative: 1 %
Eosinophils Absolute: 0.9 10*3/uL — ABNORMAL HIGH (ref 0.0–0.5)
Eosinophils Relative: 7 %
HCT: 22.5 % — ABNORMAL LOW (ref 39.0–52.0)
Hemoglobin: 7.2 g/dL — ABNORMAL LOW (ref 13.0–17.0)
Lymphocytes Relative: 24 %
Lymphs Abs: 3 10*3/uL (ref 0.7–4.0)
MCH: 30.3 pg (ref 26.0–34.0)
MCHC: 32 g/dL (ref 30.0–36.0)
MCV: 94.5 fL (ref 80.0–100.0)
Metamyelocytes Relative: 2 %
Monocytes Absolute: 4.1 10*3/uL — ABNORMAL HIGH (ref 0.1–1.0)
Monocytes Relative: 32 %
Myelocytes: 6 %
Neutro Abs: 2.8 10*3/uL (ref 1.7–7.7)
Neutrophils Relative %: 22 %
Platelets: 14 10*3/uL — CL (ref 150–400)
Promyelocytes Relative: 6 %
RBC: 2.38 MIL/uL — ABNORMAL LOW (ref 4.22–5.81)
RDW: 15.2 % (ref 11.5–15.5)
WBC: 12.7 10*3/uL — ABNORMAL HIGH (ref 4.0–10.5)
nRBC: 0 % (ref 0.0–0.2)

## 2020-04-07 LAB — SAMPLE TO BLOOD BANK

## 2020-04-07 LAB — LACTATE DEHYDROGENASE: LDH: 233 U/L — ABNORMAL HIGH (ref 98–192)

## 2020-04-07 LAB — PREPARE RBC (CROSSMATCH)

## 2020-04-07 MED ORDER — SODIUM CHLORIDE 0.9 % IV SOLN: Freq: Once | INTRAVENOUS | Status: AC

## 2020-04-07 MED ORDER — SODIUM CHLORIDE 0.9 % IV SOLN
10.0000 mg | Freq: Once | INTRAVENOUS | Status: AC
  Administered 2020-04-07: 10 mg via INTRAVENOUS
  Filled 2020-04-07: qty 10

## 2020-04-07 MED ORDER — HEPARIN SOD (PORK) LOCK FLUSH 100 UNIT/ML IV SOLN
500.0000 [IU] | Freq: Once | INTRAVENOUS | Status: AC | PRN
  Administered 2020-04-07: 500 [IU]

## 2020-04-07 MED ORDER — SODIUM CHLORIDE 0.9% FLUSH
10.0000 mL | INTRAVENOUS | Status: DC | PRN
  Administered 2020-04-07: 10 mL

## 2020-04-07 MED ORDER — PALONOSETRON HCL INJECTION 0.25 MG/5ML
0.2500 mg | Freq: Once | INTRAVENOUS | Status: AC
  Administered 2020-04-07: 0.25 mg via INTRAVENOUS
  Filled 2020-04-07: qty 5

## 2020-04-07 MED ORDER — SODIUM CHLORIDE 0.9 % IV SOLN
100.0000 mg/m2 | Freq: Once | INTRAVENOUS | Status: AC
  Administered 2020-04-07: 225 mg via INTRAVENOUS
  Filled 2020-04-07: qty 22.5

## 2020-04-07 NOTE — Progress Notes (Signed)
Thayer Dallas presented for [employer].  Portacath located right chest wall accessed with  H 20 needle.  Good blood return present. Labs drawn and port flushed.  Procedure tolerated well and without incident.

## 2020-04-07 NOTE — Progress Notes (Signed)
Patient was assessed by Dr. Delton Coombes and labs have been reviewed.  Labs already addressed with charge RN.  Patient is okay to proceed with treatment today and blood products either today or tomorrow. Primary RN and pharmacy aware.

## 2020-04-07 NOTE — Progress Notes (Signed)
CRITICAL VALUE ALERT  Critical Value:  Platelets 14  Date & Time Notied:  04/07/2020 at 1025  Provider Notified: Dr. Delton Coombes  Orders Received/Actions taken: transfuse 1 unit irradiated platelets and 1 unit irradiated PRBC for symptomatic anemia (hgb 7.2).

## 2020-04-07 NOTE — Progress Notes (Signed)
Pt here for vidaza.  Hemoglobin 7.2, platelets 14.  He will receive 1 unit of platelets and 1 unit of PRBCs. Okay for treatment today. Blood products to be given tomorrow. Vidaza increased for this treatment cycle.  Tolerated treatment well today without incidence.  Discharged ambulatory in stable condition.  Vital signs stable prior to discharge.

## 2020-04-07 NOTE — Patient Instructions (Addendum)
[place] at [facility] Discharge Instructions  You were seen today by Dr. Delton Coombes. He went over your recent results. You received your treatment and transfusions today. You may proceed with your dental cleaning. Dr. Delton Coombes will see you back in 4 weeks for labs and follow up.   Thank you for choosing [place] at [facility] to provide your oncology and hematology care.  To afford each patient quality time with our provider, please arrive at least 15 minutes before your scheduled appointment time.   If you have a lab appointment with the [facility] please come in thru the Main Entrance and check in at the main information desk  You need to re-schedule your appointment should you arrive 10 or more minutes late.  We strive to give you quality time with our providers, and arriving late affects you and other patients whose appointments are after yours.  Also, if you no show three or more times for appointments you may be dismissed from the clinic at the providers discretion.     Again, thank you for choosing [facility].  Our hope is that these requests will decrease the amount of time that you wait before being seen by our physicians.       _____________________________________________________________  Should you have questions after your visit to [facility], please contact our office at [phone] between the hours of 8:00 a.m. and 4:30 p.m.  Voicemails left after 4:00 p.m. will not be returned until the following business day.  For prescription refill requests, have your pharmacy contact our office and allow 72 hours.    Cancer Center Support Programs:   > Cancer Support Group  2nd Tuesday of the month 1pm-2pm, Journey Room

## 2020-04-07 NOTE — Progress Notes (Signed)
Hunter Smith, [place] [address]   CLINIC:  Medical Oncology/Hematology  PCP:  Celene Squibb, MD [address] [place] [address] [phone]   REASON FOR VISIT:  Follow-up for MDS, macrocytic anemia and thrombocytopenia  PRIOR THERAPY: None  NGS Results: Not done  CURRENT THERAPY: Azacitidine every 4 weeks  BRIEF ONCOLOGIC HISTORY:  Oncology History  Myelodysplastic syndrome, high grade ([place])  12/28/2019 Initial Diagnosis   MDS (myelodysplastic syndrome), high grade ([place])   12/31/2019 -  Chemotherapy   The patient had palonosetron (ALOXI) injection 0.25 mg, 0.25 mg, Intravenous,  Once, 4 of 4 cycles Administration: 0.25 mg (01/02/2020), 0.25 mg (01/04/2020), 0.25 mg (01/07/2020), 0.25 mg (02/05/2020), 0.25 mg (02/07/2020), 0.25 mg (02/11/2020), 0.25 mg (03/03/2020), 0.25 mg (03/05/2020), 0.25 mg (03/07/2020), 0.25 mg (03/10/2020), 0.25 mg (02/13/2020) azaCITIDine (VIDAZA) 170 mg in sodium chloride 0.9 % 50 mL chemo infusion, 75 mg/m2 = 170 mg, Intravenous, Once, 4 of 4 cycles Dose modification: 100 mg/m2 (original dose 75 mg/m2, Cycle 4, Reason: Provider Judgment) Administration: 170 mg (12/31/2019), 170 mg (01/01/2020), 170 mg (01/02/2020), 170 mg (01/03/2020), 170 mg (01/04/2020), 170 mg (01/07/2020), 170 mg (01/08/2020), 170 mg (02/05/2020), 170 mg (02/06/2020), 170 mg (02/07/2020), 170 mg (02/08/2020), 170 mg (02/11/2020), 170 mg (02/12/2020), 170 mg (02/13/2020), 170 mg (03/03/2020), 170 mg (03/04/2020), 170 mg (03/05/2020), 170 mg (03/06/2020), 170 mg (03/07/2020), 170 mg (03/10/2020), 170 mg (03/11/2020)  for chemotherapy treatment.      CANCER STAGING: Cancer Staging No matching staging information was found for the patient.  INTERVAL HISTORY:  [name], a 59 y.o. male, returns for routine follow-up and consideration for next cycle of chemotherapy. Hunter Smith was last seen on 03/03/2020.  Due for cycle #4 of azacitidine and Aloxi today.    Today he is accompanied by his wife. Overall, he tells me he has been feeling pretty well. He tolerated the previous treatment well and denies having any nosebleeds, hematochezia or hematuria, though he continues having mild easy bruising. He denies having F/C, N/V/D, CP or SOB with activity or lightheadedness. His appetite is good and he takes a stool softener daily.  He will have dental cleaning on 12/29.  Overall, he feels ready for next cycle of chemo today.    REVIEW OF SYSTEMS:  Review of Systems  Constitutional: Positive for appetite change (90%). Negative for chills, fatigue and fever.  HENT:   Negative for nosebleeds.   Respiratory: Negative for shortness of breath.   Cardiovascular: Negative for chest pain.  Gastrointestinal: Negative for blood in stool, diarrhea, nausea and vomiting.  Genitourinary: Negative for hematuria.   Neurological: Negative for light-headedness.  Hematological: Bruises/bleeds easily.    PAST MEDICAL/SURGICAL HISTORY:  Past Medical History:  Diagnosis Date  . Cancer ([place])   . Port-A-Cath in place 12/28/2019   Past Surgical History:  Procedure Laterality Date  . COLONOSCOPY    . PORTACATH PLACEMENT Right 01/18/2020   Procedure: INSERTION PORT-A-CATH;  Surgeon: Aviva Signs, MD;  Location: [facility];  Service: General;  Laterality: Right;    SOCIAL HISTORY:  Social History   Socioeconomic History  . Marital status: Married    Spouse name: Not on file  . Number of children: Not on file  . Years of education: Not on file  . Highest education level: Not on file  Occupational History  . Occupation: Employed  Tobacco Use  . Smoking status: Former Smoker    Types: Cigarettes  Quit date: 05/03/1988    Years since quitting: 31.9  . Smokeless tobacco: Never Used  Substance and Sexual Activity  . Alcohol use: Yes    Alcohol/week: 1.0 - 2.0 standard drink    Types: 1 - 2 Standard drinks or equivalent per week  . Drug use: Never  . Sexual  activity: Yes  Other Topics Concern  . Not on file  Social History Narrative  . Not on file   Social Determinants of Health   Financial Resource Strain: Low Risk   . Difficulty of Paying Living Expenses: Not hard at all  Food Insecurity: No Food Insecurity  . Worried About Charity fundraiser in the Last Year: Never true  . Ran Out of Food in the Last Year: Never true  Transportation Needs: No Transportation Needs  . Lack of Transportation (Medical): No  . Lack of Transportation (Non-Medical): No  Physical Activity: Inactive  . Days of Exercise per Week: 0 days  . Minutes of Exercise per Session: 0 min  Stress: No Stress Concern Present  . Feeling of Stress : Not at all  Social Connections: Moderately Integrated  . Frequency of Communication with Friends and Family: More than three times a week  . Frequency of Social Gatherings with Friends and Family: Three times a week  . Attends Religious Services: 1 to 4 times per year  . Active Member of Clubs or Organizations: No  . Attends Archivist Meetings: Never  . Marital Status: Married  Human resources officer Violence: Not At Risk  . Fear of Current or Ex-Partner: No  . Emotionally Abused: No  . Physically Abused: No  . Sexually Abused: No    FAMILY HISTORY:  Family History  Problem Relation Age of Onset  . Bladder Cancer Father   . Scoliosis Sister     CURRENT MEDICATIONS:  Current Outpatient Medications  Medication Sig Dispense Refill  . azaCITIDine 5 mg/2 mLs in lactated ringers infusion Inject into the vein daily. Days 1-7 every 28 days    . docusate sodium (COLACE) 100 MG capsule Take 100 mg by mouth 2 (two) times daily.    Marland Kitchen lidocaine-prilocaine (EMLA) cream Apply a small amount to port a cath site and cover with plastic wrap 1 hour prior to chemotherapy appointments 30 g 3  . Multiple Vitamin (MULTIVITAMIN) tablet Take 1 tablet by mouth daily.    . prochlorperazine (COMPAZINE) 10 MG tablet Take 1 tablet (10 mg  total) by mouth every 6 (six) hours as needed (Nausea or vomiting). 30 tablet 1   No current facility-administered medications for this visit.   Facility-Administered Medications Ordered in Other Visits  Medication Dose Route Frequency Provider Last Rate Last Admin  . 0.9 %  sodium chloride infusion   Intravenous Once Derek Jack, MD      . azaCITIDine (VIDAZA) 225 mg in sodium chloride 0.9 % 50 mL chemo infusion  100 mg/m2 (Treatment Plan Recorded) Intravenous Once Derek Jack, MD      . dexamethasone (DECADRON) 10 mg in sodium chloride 0.9 % 50 mL IVPB  10 mg Intravenous Once Derek Jack, MD      . heparin lock flush 100 unit/mL  500 Units Intracatheter Once PRN Derek Jack, MD      . palonosetron (ALOXI) injection 0.25 mg  0.25 mg Intravenous Once Derek Jack, MD      . sodium chloride flush (NS) 0.9 % injection 10 mL  10 mL Intracatheter PRN Derek Jack, MD  ALLERGIES:  No Known Allergies  PHYSICAL EXAM:  Performance status (ECOG): 1 - Symptomatic but completely ambulatory  Vitals:   04/07/20 0931  BP: (!) 145/77  Pulse: 90  Resp: 18  Temp: (!) 97.3 F (36.3 C)  SpO2: 99%   Wt Readings from Last 3 Encounters:  04/07/20 230 lb 3.2 oz (104.4 kg)  03/10/20 225 lb (102.1 kg)  03/03/20 223 lb 12.8 oz (101.5 kg)   Physical Exam Vitals reviewed.  Constitutional:      Appearance: Normal appearance. He is obese.  Cardiovascular:     Rate and Rhythm: Normal rate and regular rhythm.     Pulses: Normal pulses.     Heart sounds: Normal heart sounds.  Pulmonary:     Effort: Pulmonary effort is normal.     Breath sounds: Normal breath sounds.  Chest:     Comments: Port-a-Cath in R chest Abdominal:     Palpations: Abdomen is soft. There is no hepatomegaly, splenomegaly or mass.     Tenderness: There is no abdominal tenderness.     Hernia: No hernia is present.  Musculoskeletal:     Right lower leg: No edema.     Left  lower leg: No edema.  Neurological:     General: No focal deficit present.     Mental Status: He is alert and oriented to person, place, and time.  Psychiatric:        Mood and Affect: Mood normal.        Behavior: Behavior normal.     LABORATORY DATA:  I have reviewed the labs as listed.  CBC Latest Ref Rng & Units 04/07/2020 03/31/2020 03/24/2020  WBC 4.0 - 10.5 K/uL 12.7(H) 24.6(H) 7.4  Hemoglobin 13.0 - 17.0 g/dL 7.2(L) 7.3(L) 7.4(L)  Hematocrit 39 - 52 % 22.5(L) 22.8(L) 23.4(L)  Platelets 150 - 400 K/uL 14(LL) 17(LL) 14(LL)   CMP Latest Ref Rng & Units 04/07/2020 03/24/2020 03/18/2020  Glucose 70 - 99 mg/dL 113(H) 119(H) 114(H)  BUN 6 - 20 mg/dL _0 Creatinine 0.61 - 1.24 mg/dL 0.92 0.94 0.94  Sodium 135 - 145 mmol/L 136 139 137  Potassium 3.5 - 5.1 mmol/L 4.1 4.3 4.2  Chloride 98 - 111 mmol/L 105 108 106  CO2 22 - 32 mmol/L _1 Calcium 8.9 - 10.3 mg/dL 8.9 8.6(L) 8.8(L)  Total Protein 6.5 - 8.1 g/dL 6.7 6.3(L) 6.2(L)  Total Bilirubin 0.3 - 1.2 mg/dL 0.7 0.5 0.6  Alkaline Phos 38 - 126 U/L 41 40 37(L)  AST 15 - 41 U/L 35 26 21  ALT 0 - 44 U/L 66(H) 41 40   Lab Results  Component Value Date   LDH 233 (H) 04/07/2020   LDH 204 (H) 03/24/2020   LDH 149 03/18/2020    DIAGNOSTIC IMAGING:  I have independently reviewed the scans and discussed with the patient. No results found.   ASSESSMENT:  1.High-grade MDS with excess blasts/CMML: -Presentation with pancytopenia and transfusion dependent anemia. -Bone marrow biopsy on 12/14/2019 at [facility] showed hypercellular marrow (90%) with dyserythropoiesis, dysgranulopoiesis and 16% blasts. Findings consistent with MDS-EB 2. FLT3 ITD/TKD negative. -Chromosome analysis 47, XY, +8[19]/46, XY[1]. -MDS FISH panel pending. -However his last 2 CBC showing increased white count of 14-15 K with peripheral blood monocytosis, also likely CMML. -He was evaluated by Dr. Jerrye Noble at [facility]. Bone  marrow biopsy after cycle 2 at [facility] recommended. -Cycle 1 of azacitidine (5+2) on 12/31/2019. 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 Current biopsy shows similar hypercellular marrow with myeloid and erythroid dysplasia and blast fraction has decreased from 16% to 5-10%. -Bone marrow biopsy on 03/25/2020 shows hypercellular marrow (95% cellularity) involved by CMML, 5 to 10% blasts.  Compared to bone marrow biopsy from 02/01/2020, blasts were similar. -Cycle 4 of azacitidine dose increased to 100 mg per metered squared on 04/07/2020.   PLAN:  1.High-grade MDS/CMML: -He is continuing to tolerate azacitidine very well. -He had bone marrow biopsy done at [facility] on 03/25/2020. -We discussed results of the bone marrow biopsy which showed blasts were similar. -Dr. Florene Glen has talked to me about increasing the dose of azacitidine.  We will increase it to 200 mg per metered square starting cycle 4 today.  He will come back in 4 weeks prior to starting cycle 5 on 05/05/2020. -Bone marrow biopsy will be done around 05/27/2020.  2. Thrombocytopenia: -Platelet count today is 14.  No major bleeding issues but has easy bruising. -We will transfuse 1 unit of platelets.  3. Macrocytic anemia: -Hemoglobin today 7.2.  We will transfuse 1 unit PRBC irradiated.   Orders placed this encounter:  No orders of the defined types were placed in this encounter.    Derek Jack, MD [place] [phone]   I, Milinda Antis, am acting as a scribe for Dr. Sanda Linger.  I, Derek Jack MD, have reviewed the above documentation for accuracy and completeness, and I agree with the above.

## 2020-04-07 NOTE — Patient Instructions (Signed)
[facility] Discharge Instructions for Patients Receiving Chemotherapy  Today you received the following chemotherapy agents   To help prevent nausea and vomiting after your treatment, we encourage you to take your nausea medication   If you develop nausea and vomiting that is not controlled by your nausea medication, call the clinic.   BELOW ARE SYMPTOMS THAT SHOULD BE REPORTED IMMEDIATELY:  *FEVER GREATER THAN 100.5 F  *CHILLS WITH OR WITHOUT FEVER  NAUSEA AND VOMITING THAT IS NOT CONTROLLED WITH YOUR NAUSEA MEDICATION  *UNUSUAL SHORTNESS OF BREATH  *UNUSUAL BRUISING OR BLEEDING  TENDERNESS IN MOUTH AND THROAT WITH OR WITHOUT PRESENCE OF ULCERS  *URINARY PROBLEMS  *BOWEL PROBLEMS  UNUSUAL RASH Items with * indicate a potential emergency and should be followed up as soon as possible.  Feel free to call the clinic should you have any questions or concerns. The clinic phone number is [phone].  Please show the CHEMO ALERT CARD at check-in to the Emergency Department and triage nurse.   

## 2020-04-08 ENCOUNTER — Encounter (HOSPITAL_COMMUNITY): Payer: Self-pay

## 2020-04-08 ENCOUNTER — Inpatient Hospital Stay (HOSPITAL_COMMUNITY): Payer: 59

## 2020-04-08 VITALS — BP 114/64 | HR 80 | Temp 98.0°F | Resp 18

## 2020-04-08 DIAGNOSIS — C9310 Chronic myelomonocytic leukemia not having achieved remission: Secondary | ICD-10-CM | POA: Diagnosis not present

## 2020-04-08 DIAGNOSIS — D696 Thrombocytopenia, unspecified: Secondary | ICD-10-CM

## 2020-04-08 DIAGNOSIS — Z95828 Presence of other vascular implants and grafts: Secondary | ICD-10-CM

## 2020-04-08 DIAGNOSIS — D649 Anemia, unspecified: Secondary | ICD-10-CM

## 2020-04-08 DIAGNOSIS — D46Z Other myelodysplastic syndromes: Secondary | ICD-10-CM

## 2020-04-08 MED ORDER — SODIUM CHLORIDE 0.9% IV SOLUTION
250.0000 mL | Freq: Once | INTRAVENOUS | Status: AC
  Administered 2020-04-08: 250 mL via INTRAVENOUS

## 2020-04-08 MED ORDER — SODIUM CHLORIDE 0.9 % IV SOLN
100.0000 mg/m2 | Freq: Once | INTRAVENOUS | Status: AC
  Administered 2020-04-08: 225 mg via INTRAVENOUS
  Filled 2020-04-08: qty 22.5

## 2020-04-08 MED ORDER — DIPHENHYDRAMINE HCL 25 MG PO CAPS: 25.0000 mg | ORAL_CAPSULE | Freq: Once | ORAL | Status: DC

## 2020-04-08 MED ORDER — HEPARIN SOD (PORK) LOCK FLUSH 100 UNIT/ML IV SOLN
500.0000 [IU] | Freq: Once | INTRAVENOUS | Status: AC | PRN
  Administered 2020-04-08: 500 [IU]

## 2020-04-08 MED ORDER — SODIUM CHLORIDE 0.9 % IV SOLN
10.0000 mg | Freq: Once | INTRAVENOUS | Status: AC
  Administered 2020-04-08: 10 mg via INTRAVENOUS
  Filled 2020-04-08: qty 10

## 2020-04-08 MED ORDER — SODIUM CHLORIDE 0.9 % IV SOLN: Freq: Once | INTRAVENOUS | Status: AC

## 2020-04-08 MED ORDER — ACETAMINOPHEN 325 MG PO TABS: 650.0000 mg | ORAL_TABLET | Freq: Once | ORAL | Status: DC

## 2020-04-08 MED ORDER — SODIUM CHLORIDE 0.9% FLUSH
10.0000 mL | INTRAVENOUS | Status: DC | PRN
  Administered 2020-04-08: 10 mL

## 2020-04-08 NOTE — Progress Notes (Signed)
Patient tolerated blood transfusion and platelet transfusion with no complaints voiced.  Patients port flushed without difficulty.  Good blood return noted with no bruising or swelling noted at site.  Band aid applied.  VSS with discharge and left in satisfactory condition with no s/s of distress noted.

## 2020-04-09 ENCOUNTER — Inpatient Hospital Stay (HOSPITAL_COMMUNITY): Payer: 59 | Attending: Hematology

## 2020-04-09 ENCOUNTER — Other Ambulatory Visit: Payer: Self-pay

## 2020-04-09 ENCOUNTER — Encounter (HOSPITAL_COMMUNITY): Payer: Self-pay

## 2020-04-09 VITALS — BP 118/71 | HR 75 | Temp 97.7°F | Resp 18

## 2020-04-09 DIAGNOSIS — Z79899 Other long term (current) drug therapy: Secondary | ICD-10-CM | POA: Insufficient documentation

## 2020-04-09 DIAGNOSIS — D696 Thrombocytopenia, unspecified: Secondary | ICD-10-CM | POA: Diagnosis not present

## 2020-04-09 DIAGNOSIS — C9310 Chronic myelomonocytic leukemia not having achieved remission: Secondary | ICD-10-CM | POA: Diagnosis present

## 2020-04-09 DIAGNOSIS — D61818 Other pancytopenia: Secondary | ICD-10-CM | POA: Diagnosis not present

## 2020-04-09 DIAGNOSIS — Z95828 Presence of other vascular implants and grafts: Secondary | ICD-10-CM

## 2020-04-09 DIAGNOSIS — Z5111 Encounter for antineoplastic chemotherapy: Secondary | ICD-10-CM | POA: Insufficient documentation

## 2020-04-09 DIAGNOSIS — D539 Nutritional anemia, unspecified: Secondary | ICD-10-CM | POA: Insufficient documentation

## 2020-04-09 DIAGNOSIS — D46Z Other myelodysplastic syndromes: Secondary | ICD-10-CM

## 2020-04-09 LAB — TYPE AND SCREEN
ABO/RH(D): O POS
Antibody Screen: NEGATIVE
Unit division: 0

## 2020-04-09 LAB — BPAM RBC
Blood Product Expiration Date: 202112192359
ISSUE DATE / TIME: 202111301141
Unit Type and Rh: 5100

## 2020-04-09 LAB — PREPARE PLATELET PHERESIS: Unit division: 0

## 2020-04-09 LAB — BPAM PLATELET PHERESIS
Blood Product Expiration Date: 202112022359
ISSUE DATE / TIME: 202111301011
Unit Type and Rh: 2800

## 2020-04-09 MED ORDER — SODIUM CHLORIDE 0.9 % IV SOLN
10.0000 mg | Freq: Once | INTRAVENOUS | Status: AC
  Administered 2020-04-09: 10 mg via INTRAVENOUS
  Filled 2020-04-09: qty 10

## 2020-04-09 MED ORDER — PALONOSETRON HCL INJECTION 0.25 MG/5ML
INTRAVENOUS | Status: AC
  Filled 2020-04-09: qty 5

## 2020-04-09 MED ORDER — HEPARIN SOD (PORK) LOCK FLUSH 100 UNIT/ML IV SOLN
500.0000 [IU] | Freq: Once | INTRAVENOUS | Status: AC | PRN
  Administered 2020-04-09: 500 [IU]

## 2020-04-09 MED ORDER — PALONOSETRON HCL INJECTION 0.25 MG/5ML
0.2500 mg | Freq: Once | INTRAVENOUS | Status: AC
  Administered 2020-04-09: 0.25 mg via INTRAVENOUS

## 2020-04-09 MED ORDER — SODIUM CHLORIDE 0.9% FLUSH
10.0000 mL | INTRAVENOUS | Status: DC | PRN
  Administered 2020-04-09: 10 mL

## 2020-04-09 MED ORDER — SODIUM CHLORIDE 0.9 % IV SOLN
100.0000 mg/m2 | Freq: Once | INTRAVENOUS | Status: AC
  Administered 2020-04-09: 225 mg via INTRAVENOUS
  Filled 2020-04-09: qty 22.5

## 2020-04-09 MED ORDER — SODIUM CHLORIDE 0.9 % IV SOLN: Freq: Once | INTRAVENOUS | Status: AC

## 2020-04-09 NOTE — Progress Notes (Signed)
Pt here for D3 of Vidaza, okay for treatment.  Vital signs WNL for treatment.  Tolerated treatment well today without incidence.  Discharged in stable condition ambulatory.  Vital signs stable prior to discharge.

## 2020-04-09 NOTE — Patient Instructions (Signed)
[facility] Discharge Instructions for Patients Receiving Chemotherapy  Today you received the following chemotherapy agents   To help prevent nausea and vomiting after your treatment, we encourage you to take your nausea medication   If you develop nausea and vomiting that is not controlled by your nausea medication, call the clinic.   BELOW ARE SYMPTOMS THAT SHOULD BE REPORTED IMMEDIATELY:  *FEVER GREATER THAN 100.5 F  *CHILLS WITH OR WITHOUT FEVER  NAUSEA AND VOMITING THAT IS NOT CONTROLLED WITH YOUR NAUSEA MEDICATION  *UNUSUAL SHORTNESS OF BREATH  *UNUSUAL BRUISING OR BLEEDING  TENDERNESS IN MOUTH AND THROAT WITH OR WITHOUT PRESENCE OF ULCERS  *URINARY PROBLEMS  *BOWEL PROBLEMS  UNUSUAL RASH Items with * indicate a potential emergency and should be followed up as soon as possible.  Feel free to call the clinic should you have any questions or concerns. The clinic phone number is [phone].  Please show the CHEMO ALERT CARD at check-in to the Emergency Department and triage nurse.   

## 2020-04-10 ENCOUNTER — Inpatient Hospital Stay (HOSPITAL_COMMUNITY): Payer: 59

## 2020-04-10 ENCOUNTER — Encounter (HOSPITAL_COMMUNITY): Payer: Self-pay

## 2020-04-10 VITALS — BP 122/76 | HR 70 | Temp 97.3°F | Resp 18

## 2020-04-10 DIAGNOSIS — C9310 Chronic myelomonocytic leukemia not having achieved remission: Secondary | ICD-10-CM | POA: Diagnosis not present

## 2020-04-10 DIAGNOSIS — Z95828 Presence of other vascular implants and grafts: Secondary | ICD-10-CM

## 2020-04-10 DIAGNOSIS — D46Z Other myelodysplastic syndromes: Secondary | ICD-10-CM

## 2020-04-10 MED ORDER — SODIUM CHLORIDE 0.9 % IV SOLN: Freq: Once | INTRAVENOUS | Status: AC

## 2020-04-10 MED ORDER — SODIUM CHLORIDE 0.9% FLUSH
10.0000 mL | INTRAVENOUS | Status: DC | PRN
  Administered 2020-04-10: 10 mL

## 2020-04-10 MED ORDER — SODIUM CHLORIDE 0.9 % IV SOLN
10.0000 mg | Freq: Once | INTRAVENOUS | Status: AC
  Administered 2020-04-10: 10 mg via INTRAVENOUS
  Filled 2020-04-10: qty 10

## 2020-04-10 MED ORDER — HEPARIN SOD (PORK) LOCK FLUSH 100 UNIT/ML IV SOLN
500.0000 [IU] | Freq: Once | INTRAVENOUS | Status: AC | PRN
  Administered 2020-04-10: 500 [IU]

## 2020-04-10 MED ORDER — SODIUM CHLORIDE 0.9 % IV SOLN
100.0000 mg/m2 | Freq: Once | INTRAVENOUS | Status: AC
  Administered 2020-04-10: 225 mg via INTRAVENOUS
  Filled 2020-04-10: qty 22.5

## 2020-04-10 NOTE — Progress Notes (Signed)
Pt here for D4 of vidaza.  Vital signs WNL for treatment.    Tolerated treatment well today without incidence.  Vital signs stable prior to discharge.  Discharged ambulatory in stable condition.

## 2020-04-10 NOTE — Patient Instructions (Signed)
[facility] Discharge Instructions for Patients Receiving Chemotherapy  Today you received the following chemotherapy agents   To help prevent nausea and vomiting after your treatment, we encourage you to take your nausea medication   If you develop nausea and vomiting that is not controlled by your nausea medication, call the clinic.   BELOW ARE SYMPTOMS THAT SHOULD BE REPORTED IMMEDIATELY:  *FEVER GREATER THAN 100.5 F  *CHILLS WITH OR WITHOUT FEVER  NAUSEA AND VOMITING THAT IS NOT CONTROLLED WITH YOUR NAUSEA MEDICATION  *UNUSUAL SHORTNESS OF BREATH  *UNUSUAL BRUISING OR BLEEDING  TENDERNESS IN MOUTH AND THROAT WITH OR WITHOUT PRESENCE OF ULCERS  *URINARY PROBLEMS  *BOWEL PROBLEMS  UNUSUAL RASH Items with * indicate a potential emergency and should be followed up as soon as possible.  Feel free to call the clinic should you have any questions or concerns. The clinic phone number is [phone].  Please show the CHEMO ALERT CARD at check-in to the Emergency Department and triage nurse.   

## 2020-04-11 ENCOUNTER — Encounter (HOSPITAL_COMMUNITY): Payer: Self-pay

## 2020-04-11 ENCOUNTER — Other Ambulatory Visit: Payer: Self-pay

## 2020-04-11 ENCOUNTER — Inpatient Hospital Stay (HOSPITAL_COMMUNITY): Payer: 59

## 2020-04-11 VITALS — BP 121/79 | HR 70 | Temp 97.3°F | Resp 18

## 2020-04-11 DIAGNOSIS — Z95828 Presence of other vascular implants and grafts: Secondary | ICD-10-CM

## 2020-04-11 DIAGNOSIS — D46Z Other myelodysplastic syndromes: Secondary | ICD-10-CM

## 2020-04-11 DIAGNOSIS — C9310 Chronic myelomonocytic leukemia not having achieved remission: Secondary | ICD-10-CM | POA: Diagnosis not present

## 2020-04-11 MED ORDER — SODIUM CHLORIDE 0.9 % IV SOLN
10.0000 mg | Freq: Once | INTRAVENOUS | Status: AC
  Administered 2020-04-11: 10 mg via INTRAVENOUS
  Filled 2020-04-11: qty 10

## 2020-04-11 MED ORDER — HEPARIN SOD (PORK) LOCK FLUSH 100 UNIT/ML IV SOLN
500.0000 [IU] | Freq: Once | INTRAVENOUS | Status: AC | PRN
  Administered 2020-04-11: 500 [IU]

## 2020-04-11 MED ORDER — OCTREOTIDE ACETATE 30 MG IM KIT
PACK | INTRAMUSCULAR | Status: AC
  Filled 2020-04-11: qty 1

## 2020-04-11 MED ORDER — SODIUM CHLORIDE 0.9 % IV SOLN
100.0000 mg/m2 | Freq: Once | INTRAVENOUS | Status: AC
  Administered 2020-04-11: 225 mg via INTRAVENOUS
  Filled 2020-04-11: qty 22.5

## 2020-04-11 MED ORDER — SODIUM CHLORIDE 0.9% FLUSH
10.0000 mL | INTRAVENOUS | Status: DC | PRN
  Administered 2020-04-11: 10 mL

## 2020-04-11 MED ORDER — SODIUM CHLORIDE 0.9 % IV SOLN: Freq: Once | INTRAVENOUS | Status: AC

## 2020-04-11 MED ORDER — PALONOSETRON HCL INJECTION 0.25 MG/5ML
0.2500 mg | Freq: Once | INTRAVENOUS | Status: AC
  Administered 2020-04-11: 0.25 mg via INTRAVENOUS
  Filled 2020-04-11: qty 5

## 2020-04-11 NOTE — Progress Notes (Signed)
Patient tolerated chemotherapy with no complaints voiced.  Side effects with management reviewed with understanding verbalized.  Port site clean and dry with no bruising or swelling noted at site.  Good blood return noted before and after administration of chemotherapy.  Band aid applied.  Patient left in satisfactory condition with VSS and no s/s of distress noted.   

## 2020-04-11 NOTE — Patient Instructions (Signed)
[facility] Discharge Instructions for Patients Receiving Chemotherapy  Today you received the following chemotherapy agents   To help prevent nausea and vomiting after your treatment, we encourage you to take your nausea medication   If you develop nausea and vomiting that is not controlled by your nausea medication, call the clinic.   BELOW ARE SYMPTOMS THAT SHOULD BE REPORTED IMMEDIATELY:  *FEVER GREATER THAN 100.5 F  *CHILLS WITH OR WITHOUT FEVER  NAUSEA AND VOMITING THAT IS NOT CONTROLLED WITH YOUR NAUSEA MEDICATION  *UNUSUAL SHORTNESS OF BREATH  *UNUSUAL BRUISING OR BLEEDING  TENDERNESS IN MOUTH AND THROAT WITH OR WITHOUT PRESENCE OF ULCERS  *URINARY PROBLEMS  *BOWEL PROBLEMS  UNUSUAL RASH Items with * indicate a potential emergency and should be followed up as soon as possible.  Feel free to call the clinic should you have any questions or concerns. The clinic phone number is [phone].  Please show the CHEMO ALERT CARD at check-in to the Emergency Department and triage nurse.   

## 2020-04-14 ENCOUNTER — Encounter (HOSPITAL_COMMUNITY): Payer: Self-pay

## 2020-04-14 ENCOUNTER — Inpatient Hospital Stay (HOSPITAL_COMMUNITY): Payer: 59

## 2020-04-14 ENCOUNTER — Other Ambulatory Visit: Payer: Self-pay

## 2020-04-14 VITALS — BP 129/78 | HR 95 | Temp 97.3°F | Resp 18

## 2020-04-14 DIAGNOSIS — Z95828 Presence of other vascular implants and grafts: Secondary | ICD-10-CM

## 2020-04-14 DIAGNOSIS — D46Z Other myelodysplastic syndromes: Secondary | ICD-10-CM

## 2020-04-14 DIAGNOSIS — C9310 Chronic myelomonocytic leukemia not having achieved remission: Secondary | ICD-10-CM | POA: Diagnosis not present

## 2020-04-14 LAB — CBC WITH DIFFERENTIAL/PLATELET
Abs Immature Granulocytes: 0.27 K/uL — ABNORMAL HIGH (ref 0.00–0.07)
Basophils Absolute: 0.1 K/uL (ref 0.0–0.1)
Basophils Relative: 1 %
Eosinophils Absolute: 0.2 K/uL (ref 0.0–0.5)
Eosinophils Relative: 3 %
HCT: 21.9 % — ABNORMAL LOW (ref 39.0–52.0)
Hemoglobin: 7.1 g/dL — ABNORMAL LOW (ref 13.0–17.0)
Immature Granulocytes: 5 %
Lymphocytes Relative: 25 %
Lymphs Abs: 1.5 K/uL (ref 0.7–4.0)
MCH: 30.3 pg (ref 26.0–34.0)
MCHC: 32.4 g/dL (ref 30.0–36.0)
MCV: 93.6 fL (ref 80.0–100.0)
Monocytes Absolute: 3.3 K/uL — ABNORMAL HIGH (ref 0.1–1.0)
Monocytes Relative: 55 %
Neutro Abs: 0.6 K/uL — ABNORMAL LOW (ref 1.7–7.7)
Neutrophils Relative %: 11 %
Platelets: 13 K/uL — CL (ref 150–400)
RBC: 2.34 MIL/uL — ABNORMAL LOW (ref 4.22–5.81)
RDW: 14.6 % (ref 11.5–15.5)
WBC: 5.9 K/uL (ref 4.0–10.5)
nRBC: 0 % (ref 0.0–0.2)

## 2020-04-14 LAB — COMPREHENSIVE METABOLIC PANEL WITH GFR
ALT: 60 U/L — ABNORMAL HIGH (ref 0–44)
AST: 24 U/L (ref 15–41)
Albumin: 3.5 g/dL (ref 3.5–5.0)
Alkaline Phosphatase: 37 U/L — ABNORMAL LOW (ref 38–126)
Anion gap: 6 (ref 5–15)
BUN: 22 mg/dL — ABNORMAL HIGH (ref 6–20)
CO2: 26 mmol/L (ref 22–32)
Calcium: 8.6 mg/dL — ABNORMAL LOW (ref 8.9–10.3)
Chloride: 103 mmol/L (ref 98–111)
Creatinine, Ser: 1.01 mg/dL (ref 0.61–1.24)
GFR, Estimated: >60 mL/min
Glucose, Bld: 107 mg/dL — ABNORMAL HIGH (ref 70–99)
Potassium: 4.1 mmol/L (ref 3.5–5.1)
Sodium: 135 mmol/L (ref 135–145)
Total Bilirubin: 0.6 mg/dL (ref 0.3–1.2)
Total Protein: 6.1 g/dL — ABNORMAL LOW (ref 6.5–8.1)

## 2020-04-14 LAB — SAMPLE TO BLOOD BANK

## 2020-04-14 LAB — PREPARE RBC (CROSSMATCH)

## 2020-04-14 MED ORDER — HEPARIN SOD (PORK) LOCK FLUSH 100 UNIT/ML IV SOLN
500.0000 [IU] | Freq: Once | INTRAVENOUS | Status: AC | PRN
  Administered 2020-04-14: 500 [IU]

## 2020-04-14 MED ORDER — SODIUM CHLORIDE 0.9 % IV SOLN
10.0000 mg | Freq: Once | INTRAVENOUS | Status: AC
  Administered 2020-04-14: 10 mg via INTRAVENOUS
  Filled 2020-04-14: qty 10

## 2020-04-14 MED ORDER — SODIUM CHLORIDE 0.9 % IV SOLN: Freq: Once | INTRAVENOUS | Status: AC

## 2020-04-14 MED ORDER — OCTREOTIDE ACETATE 30 MG IM KIT
PACK | INTRAMUSCULAR | Status: AC
  Filled 2020-04-14: qty 2

## 2020-04-14 MED ORDER — PALONOSETRON HCL INJECTION 0.25 MG/5ML
0.2500 mg | Freq: Once | INTRAVENOUS | Status: AC
  Administered 2020-04-14: 0.25 mg via INTRAVENOUS
  Filled 2020-04-14: qty 5

## 2020-04-14 MED ORDER — SODIUM CHLORIDE 0.9 % IV SOLN
100.0000 mg/m2 | Freq: Once | INTRAVENOUS | Status: AC
  Administered 2020-04-14: 225 mg via INTRAVENOUS
  Filled 2020-04-14: qty 22.5

## 2020-04-14 MED ORDER — SODIUM CHLORIDE 0.9% FLUSH
10.0000 mL | INTRAVENOUS | Status: DC | PRN
  Administered 2020-04-14: 10 mL

## 2020-04-14 NOTE — Progress Notes (Signed)
Verbal order received to proceed with treatment today. Reported ANC 0.6. MD aware. No blood products today per Dr. Chryl Heck. Patient to return on Thursday for repeat labs and possible blood products on Friday.

## 2020-04-14 NOTE — Patient Instructions (Signed)
[facility] Discharge Instructions  Labs drawn from portacath today   Thank you for choosing [facility] at [facility] to provide your oncology and hematology care.  To afford each patient quality time with our provider, please arrive at least 15 minutes before your scheduled appointment time.   If you have a lab appointment with the Cancer Center please come in thru the Main Entrance and check in at the main information desk.  You need to re-schedule your appointment should you arrive 10 or more minutes late.  We strive to give you quality time with our providers, and arriving late affects you and other patients whose appointments are after yours.  Also, if you no show three or more times for appointments you may be dismissed from the clinic at the providers discretion.     Again, thank you for choosing [facility].  Our hope is that these requests will decrease the amount of time that you wait before being seen by our physicians.       _____________________________________________________________  Should you have questions after your visit to [facility], please contact our office at [phone] and follow the prompts.  Our office hours are 8:00 a.m. and 4:30 p.m. Monday - Friday.  Please note that voicemails left after 4:00 p.m. may not be returned until the following business day.  We are closed weekends and major holidays.  You do have access to a nurse 24-7, just call the main number to the clinic [phone] and do not press any options, hold on the line and a nurse will answer the phone.    For prescription refill requests, have your pharmacy contact our office and allow 72 hours.    Due to Covid, you will need to wear a mask upon entering the hospital. If you do not have a mask, a mask will be given to you at the Main Entrance upon arrival. For doctor visits, patients may have 1 support person age 18  or older with them. For treatment visits, patients can not have anyone with them due to social distancing guidelines and our immunocompromised population.     

## 2020-04-14 NOTE — Progress Notes (Signed)
CRITICAL VALUE STICKER  CRITICAL VALUE: Platelets 13  RECEIVER (on-site recipient of call): Alferd Apa, LPN   DATE & TIME NOTIFIED:  04/14/20 9:06am  MESSENGER (representative from lab): Katie  MD NOTIFIED: Dr. Benay Pike  TIME OF NOTIFICATION: 9:07am  RESPONSE: Critical Value Received.  Provider made aware and orders provided to primary RN.

## 2020-04-14 NOTE — Patient Instructions (Signed)
[facility] Discharge Instructions for Patients Receiving Chemotherapy  Today you received the following chemotherapy agents   To help prevent nausea and vomiting after your treatment, we encourage you to take your nausea medication   If you develop nausea and vomiting that is not controlled by your nausea medication, call the clinic.   BELOW ARE SYMPTOMS THAT SHOULD BE REPORTED IMMEDIATELY:  *FEVER GREATER THAN 100.5 F  *CHILLS WITH OR WITHOUT FEVER  NAUSEA AND VOMITING THAT IS NOT CONTROLLED WITH YOUR NAUSEA MEDICATION  *UNUSUAL SHORTNESS OF BREATH  *UNUSUAL BRUISING OR BLEEDING  TENDERNESS IN MOUTH AND THROAT WITH OR WITHOUT PRESENCE OF ULCERS  *URINARY PROBLEMS  *BOWEL PROBLEMS  UNUSUAL RASH Items with * indicate a potential emergency and should be followed up as soon as possible.  Feel free to call the clinic should you have any questions or concerns. The clinic phone number is [phone].  Please show the CHEMO ALERT CARD at check-in to the Emergency Department and triage nurse.   

## 2020-04-14 NOTE — Progress Notes (Signed)
Patient presents today for treatment.  Vital signs stable.  No new complaints since last visit.  Labs reviewed Hgb 7.1, Platelets 13, and ANC 0.6.  Message sent to Dr. Chryl Heck to review labs.  New instruction from Dr. Chryl Heck no blood products today if the patient has no bleeding and recheck labs later this week.  Patient scheduled to come back Wednesday for repeat CBC with Diff.  Treatment given today per MD orders.  Tolerated infusion without adverse affects.  Vital signs stable.  No complaints at this time.  Discharge from clinic ambulatory in stable condition.  Alert and oriented X 3.  Follow up with [facility] as scheduled.

## 2020-04-15 ENCOUNTER — Encounter (HOSPITAL_COMMUNITY): Payer: Self-pay

## 2020-04-15 ENCOUNTER — Inpatient Hospital Stay (HOSPITAL_COMMUNITY): Payer: 59

## 2020-04-15 VITALS — BP 137/83 | HR 83 | Temp 96.9°F | Resp 18

## 2020-04-15 DIAGNOSIS — C9310 Chronic myelomonocytic leukemia not having achieved remission: Secondary | ICD-10-CM | POA: Diagnosis not present

## 2020-04-15 DIAGNOSIS — Z95828 Presence of other vascular implants and grafts: Secondary | ICD-10-CM

## 2020-04-15 DIAGNOSIS — D46Z Other myelodysplastic syndromes: Secondary | ICD-10-CM

## 2020-04-15 MED ORDER — SODIUM CHLORIDE 0.9% FLUSH
10.0000 mL | INTRAVENOUS | Status: AC | PRN
  Administered 2020-04-15: 10 mL

## 2020-04-15 MED ORDER — SODIUM CHLORIDE 0.9% IV SOLUTION
250.0000 mL | Freq: Once | INTRAVENOUS | Status: AC
  Administered 2020-04-15: 250 mL via INTRAVENOUS

## 2020-04-15 MED ORDER — HEPARIN SOD (PORK) LOCK FLUSH 100 UNIT/ML IV SOLN: 500.0000 [IU] | Freq: Once | INTRAVENOUS | Status: DC | PRN

## 2020-04-15 MED ORDER — SODIUM CHLORIDE 0.9% FLUSH: 10.0000 mL | INTRAVENOUS | Status: DC | PRN

## 2020-04-15 MED ORDER — SODIUM CHLORIDE 0.9 % IV SOLN: Freq: Once | INTRAVENOUS | Status: AC

## 2020-04-15 MED ORDER — SODIUM CHLORIDE 0.9 % IV SOLN
100.0000 mg/m2 | Freq: Once | INTRAVENOUS | Status: AC
  Administered 2020-04-15: 225 mg via INTRAVENOUS
  Filled 2020-04-15: qty 22.5

## 2020-04-15 MED ORDER — HEPARIN SOD (PORK) LOCK FLUSH 100 UNIT/ML IV SOLN
500.0000 [IU] | Freq: Every day | INTRAVENOUS | Status: AC | PRN
  Administered 2020-04-15: 500 [IU]

## 2020-04-15 MED ORDER — SODIUM CHLORIDE 0.9 % IV SOLN
10.0000 mg | Freq: Once | INTRAVENOUS | Status: AC
  Administered 2020-04-15: 10 mg via INTRAVENOUS
  Filled 2020-04-15: qty 10

## 2020-04-15 NOTE — Progress Notes (Signed)
Pt here for D7 of vidaza.  Pt will also get 1 unit PRBCs today.  Vital signs WNL for treatment.  Toelrated treatment well today without incidence.  Discharged ambulatory in stable condition.  Vital signs stable prior to discharge.

## 2020-04-15 NOTE — Patient Instructions (Signed)
[facility] Discharge Instructions for Patients Receiving Chemotherapy  Today you received the following chemotherapy agents   To help prevent nausea and vomiting after your treatment, we encourage you to take your nausea medication   If you develop nausea and vomiting that is not controlled by your nausea medication, call the clinic.   BELOW ARE SYMPTOMS THAT SHOULD BE REPORTED IMMEDIATELY:  *FEVER GREATER THAN 100.5 F  *CHILLS WITH OR WITHOUT FEVER  NAUSEA AND VOMITING THAT IS NOT CONTROLLED WITH YOUR NAUSEA MEDICATION  *UNUSUAL SHORTNESS OF BREATH  *UNUSUAL BRUISING OR BLEEDING  TENDERNESS IN MOUTH AND THROAT WITH OR WITHOUT PRESENCE OF ULCERS  *URINARY PROBLEMS  *BOWEL PROBLEMS  UNUSUAL RASH Items with * indicate a potential emergency and should be followed up as soon as possible.  Feel free to call the clinic should you have any questions or concerns. The clinic phone number is [phone].  Please show the CHEMO ALERT CARD at check-in to the Emergency Department and triage nurse.   

## 2020-04-16 LAB — TYPE AND SCREEN
ABO/RH(D): O POS
Antibody Screen: NEGATIVE
Unit division: 0

## 2020-04-16 LAB — BPAM RBC
Blood Product Expiration Date: 202112192359
ISSUE DATE / TIME: 202112070953
Unit Type and Rh: 5100

## 2020-04-17 ENCOUNTER — Inpatient Hospital Stay (HOSPITAL_COMMUNITY): Payer: 59

## 2020-04-17 ENCOUNTER — Encounter (HOSPITAL_COMMUNITY): Payer: Self-pay

## 2020-04-17 DIAGNOSIS — C9310 Chronic myelomonocytic leukemia not having achieved remission: Secondary | ICD-10-CM | POA: Diagnosis not present

## 2020-04-17 DIAGNOSIS — D46Z Other myelodysplastic syndromes: Secondary | ICD-10-CM

## 2020-04-17 LAB — CBC WITH DIFFERENTIAL/PLATELET
Band Neutrophils: 2 %
Basophils Absolute: 0 10*3/uL (ref 0.0–0.1)
Basophils Relative: 0 %
Blasts: 1 %
Eosinophils Absolute: 0.2 10*3/uL (ref 0.0–0.5)
Eosinophils Relative: 5 %
HCT: 21.9 % — ABNORMAL LOW (ref 39.0–52.0)
Hemoglobin: 7.1 g/dL — ABNORMAL LOW (ref 13.0–17.0)
Lymphocytes Relative: 59 %
Lymphs Abs: 2 10*3/uL (ref 0.7–4.0)
MCH: 30 pg (ref 26.0–34.0)
MCHC: 32.4 g/dL (ref 30.0–36.0)
MCV: 92.4 fL (ref 80.0–100.0)
Monocytes Absolute: 0.7 10*3/uL (ref 0.1–1.0)
Monocytes Relative: 20 %
Myelocytes: 1 %
Neutro Abs: 0.5 10*3/uL — ABNORMAL LOW (ref 1.7–7.7)
Neutrophils Relative %: 12 %
Platelets: 12 10*3/uL — CL (ref 150–400)
RBC: 2.37 MIL/uL — ABNORMAL LOW (ref 4.22–5.81)
RDW: 15.6 % — ABNORMAL HIGH (ref 11.5–15.5)
WBC: 3.4 10*3/uL — ABNORMAL LOW (ref 4.0–10.5)
nRBC: 0 % (ref 0.0–0.2)

## 2020-04-17 LAB — SAMPLE TO BLOOD BANK

## 2020-04-17 MED ORDER — HEPARIN SOD (PORK) LOCK FLUSH 100 UNIT/ML IV SOLN
500.0000 [IU] | Freq: Once | INTRAVENOUS | Status: AC
  Administered 2020-04-17: 500 [IU] via INTRAVENOUS

## 2020-04-17 MED ORDER — SODIUM CHLORIDE 0.9% FLUSH
20.0000 mL | INTRAVENOUS | Status: DC | PRN
  Administered 2020-04-17: 20 mL via INTRAVENOUS

## 2020-04-17 NOTE — Patient Instructions (Signed)
[facility] at [facility] Discharge Instructions  Labs drawn from portacath today   Thank you for choosing [facility] at [facility] to provide your oncology and hematology care.  To afford each patient quality time with our provider, please arrive at least 15 minutes before your scheduled appointment time.   If you have a lab appointment with the Cancer Center please come in thru the Main Entrance and check in at the main information desk.  You need to re-schedule your appointment should you arrive 10 or more minutes late.  We strive to give you quality time with our providers, and arriving late affects you and other patients whose appointments are after yours.  Also, if you no show three or more times for appointments you may be dismissed from the clinic at the providers discretion.     Again, thank you for choosing [facility].  Our hope is that these requests will decrease the amount of time that you wait before being seen by our physicians.       _____________________________________________________________  Should you have questions after your visit to [facility], please contact our office at [phone] and follow the prompts.  Our office hours are 8:00 a.m. and 4:30 p.m. Monday - Friday.  Please note that voicemails left after 4:00 p.m. may not be returned until the following business day.  We are closed weekends and major holidays.  You do have access to a nurse 24-7, just call the main number to the clinic [phone] and do not press any options, hold on the line and a nurse will answer the phone.    For prescription refill requests, have your pharmacy contact our office and allow 72 hours.    Due to Covid, you will need to wear a mask upon entering the hospital. If you do not have a mask, a mask will be given to you at the Main Entrance upon arrival. For doctor visits, patients may have 1 support person age 18  or older with them. For treatment visits, patients can not have anyone with them due to social distancing guidelines and our immunocompromised population.     

## 2020-04-17 NOTE — Progress Notes (Signed)
Hunter Smith tolerated port lab draw well without complaints or incident. VSS Pt discharged self ambulatory in satisfactory condition

## 2020-04-18 ENCOUNTER — Inpatient Hospital Stay (HOSPITAL_COMMUNITY): Payer: 59

## 2020-04-18 ENCOUNTER — Other Ambulatory Visit: Payer: Self-pay

## 2020-04-18 ENCOUNTER — Encounter (HOSPITAL_COMMUNITY): Payer: Self-pay

## 2020-04-18 VITALS — BP 121/72 | HR 81 | Temp 97.0°F | Resp 18

## 2020-04-18 DIAGNOSIS — C9310 Chronic myelomonocytic leukemia not having achieved remission: Secondary | ICD-10-CM | POA: Diagnosis not present

## 2020-04-18 DIAGNOSIS — D696 Thrombocytopenia, unspecified: Secondary | ICD-10-CM

## 2020-04-18 MED ORDER — HEPARIN SOD (PORK) LOCK FLUSH 100 UNIT/ML IV SOLN
500.0000 [IU] | Freq: Every day | INTRAVENOUS | Status: AC | PRN
  Administered 2020-04-18: 500 [IU]

## 2020-04-18 MED ORDER — ACETAMINOPHEN 325 MG PO TABS: 650.0000 mg | ORAL_TABLET | Freq: Once | ORAL | Status: DC

## 2020-04-18 MED ORDER — DIPHENHYDRAMINE HCL 25 MG PO CAPS: 25.0000 mg | ORAL_CAPSULE | Freq: Once | ORAL | Status: DC

## 2020-04-18 MED ORDER — SODIUM CHLORIDE 0.9 % IV SOLN: INTRAVENOUS | Status: DC

## 2020-04-18 NOTE — Progress Notes (Signed)
Tolerated platelet transfusion w/o adverse reaction.  Alert, in no distress.  VSS.  Discharged ambulatory in stable condition.

## 2020-04-21 ENCOUNTER — Inpatient Hospital Stay (HOSPITAL_COMMUNITY): Payer: 59

## 2020-04-21 ENCOUNTER — Other Ambulatory Visit: Payer: Self-pay

## 2020-04-21 ENCOUNTER — Encounter (HOSPITAL_COMMUNITY): Payer: Self-pay

## 2020-04-21 VITALS — BP 125/74 | HR 75 | Resp 18

## 2020-04-21 DIAGNOSIS — D649 Anemia, unspecified: Secondary | ICD-10-CM

## 2020-04-21 DIAGNOSIS — D46Z Other myelodysplastic syndromes: Secondary | ICD-10-CM

## 2020-04-21 DIAGNOSIS — C9310 Chronic myelomonocytic leukemia not having achieved remission: Secondary | ICD-10-CM | POA: Diagnosis not present

## 2020-04-21 LAB — PREPARE RBC (CROSSMATCH)

## 2020-04-21 LAB — SAMPLE TO BLOOD BANK

## 2020-04-21 LAB — PREPARE PLATELET PHERESIS: Unit division: 0

## 2020-04-21 LAB — BPAM PLATELET PHERESIS
Blood Product Expiration Date: 202112122359
ISSUE DATE / TIME: 202112100955
Unit Type and Rh: 5100

## 2020-04-21 MED ORDER — SODIUM CHLORIDE 0.9% FLUSH
10.0000 mL | Freq: Once | INTRAVENOUS | Status: AC
  Administered 2020-04-21: 10 mL via INTRAVENOUS

## 2020-04-21 MED ORDER — HEPARIN SOD (PORK) LOCK FLUSH 100 UNIT/ML IV SOLN
500.0000 [IU] | Freq: Once | INTRAVENOUS | Status: AC
  Administered 2020-04-21: 500 [IU] via INTRAVENOUS

## 2020-04-21 NOTE — Progress Notes (Signed)
Patients port flushed without difficulty.  Good blood return noted with no bruising or swelling noted at site.  Band aid applied.  VSS with discharge and left in satisfactory condition with no s/s of distress noted.   

## 2020-04-21 NOTE — Progress Notes (Signed)
Pt will be getting 2 units of PRBCs tomorrow.

## 2020-04-21 NOTE — Progress Notes (Signed)
CRITICAL VALUE ALERT  Critical Value:  hgb 6.6; platelets 15  Date & Time Notied:  04/21/2020 at [place]  Provider Notified: Dr. Chryl Heck   Orders Received/Actions taken: transfuse 2 units irradiate PRBC

## 2020-04-22 ENCOUNTER — Other Ambulatory Visit: Payer: Self-pay

## 2020-04-22 ENCOUNTER — Inpatient Hospital Stay (HOSPITAL_COMMUNITY): Payer: 59

## 2020-04-22 DIAGNOSIS — C9310 Chronic myelomonocytic leukemia not having achieved remission: Secondary | ICD-10-CM | POA: Diagnosis not present

## 2020-04-22 DIAGNOSIS — D46Z Other myelodysplastic syndromes: Secondary | ICD-10-CM

## 2020-04-22 DIAGNOSIS — D649 Anemia, unspecified: Secondary | ICD-10-CM

## 2020-04-22 LAB — CBC WITH DIFFERENTIAL/PLATELET
Abs Immature Granulocytes: 0 10*3/uL (ref 0.00–0.07)
Band Neutrophils: 0 %
Basophils Absolute: 0 10*3/uL (ref 0.0–0.1)
Basophils Relative: 0 %
Blasts: 0 %
Eosinophils Absolute: 0 10*3/uL (ref 0.0–0.5)
Eosinophils Relative: 0 %
HCT: 20.4 % — ABNORMAL LOW (ref 39.0–52.0)
Hemoglobin: 6.6 g/dL — CL (ref 13.0–17.0)
Lymphocytes Relative: 48 %
Lymphs Abs: 1 10*3/uL (ref 0.7–4.0)
MCH: 30.3 pg (ref 26.0–34.0)
MCHC: 32.4 g/dL (ref 30.0–36.0)
MCV: 93.6 fL (ref 80.0–100.0)
Metamyelocytes Relative: 0 %
Monocytes Absolute: 0.8 10*3/uL (ref 0.1–1.0)
Monocytes Relative: 36 %
Myelocytes: 0 %
Neutro Abs: 0.4 10*3/uL — CL (ref 1.7–7.7)
Neutrophils Relative %: 16 %
Other: 0 %
Platelets: 15 10*3/uL — CL (ref 150–400)
Promyelocytes Relative: 0 %
RBC: 2.18 MIL/uL — ABNORMAL LOW (ref 4.22–5.81)
RDW: 14.4 % (ref 11.5–15.5)
WBC: 2.2 10*3/uL — ABNORMAL LOW (ref 4.0–10.5)
nRBC: 0 % (ref 0.0–0.2)
nRBC: 0 /100 WBC

## 2020-04-22 MED ORDER — HEPARIN SOD (PORK) LOCK FLUSH 100 UNIT/ML IV SOLN
500.0000 [IU] | Freq: Every day | INTRAVENOUS | Status: AC | PRN
  Administered 2020-04-22: 500 [IU]

## 2020-04-22 MED ORDER — SODIUM CHLORIDE 0.9% FLUSH
10.0000 mL | INTRAVENOUS | Status: AC | PRN
  Administered 2020-04-22: 10 mL

## 2020-04-22 MED ORDER — SODIUM CHLORIDE 0.9% IV SOLUTION
250.0000 mL | Freq: Once | INTRAVENOUS | Status: AC
  Administered 2020-04-22: 250 mL via INTRAVENOUS

## 2020-04-22 MED ORDER — ACETAMINOPHEN 325 MG PO TABS: 650.0000 mg | ORAL_TABLET | Freq: Once | ORAL | Status: DC

## 2020-04-22 NOTE — Patient Instructions (Signed)
[facility] Discharge Instructions for Patients Receiving Chemotherapy  Today you received the following chemotherapy agents   To help prevent nausea and vomiting after your treatment, we encourage you to take your nausea medication   If you develop nausea and vomiting that is not controlled by your nausea medication, call the clinic.   BELOW ARE SYMPTOMS THAT SHOULD BE REPORTED IMMEDIATELY:  *FEVER GREATER THAN 100.5 F  *CHILLS WITH OR WITHOUT FEVER  NAUSEA AND VOMITING THAT IS NOT CONTROLLED WITH YOUR NAUSEA MEDICATION  *UNUSUAL SHORTNESS OF BREATH  *UNUSUAL BRUISING OR BLEEDING  TENDERNESS IN MOUTH AND THROAT WITH OR WITHOUT PRESENCE OF ULCERS  *URINARY PROBLEMS  *BOWEL PROBLEMS  UNUSUAL RASH Items with * indicate a potential emergency and should be followed up as soon as possible.  Feel free to call the clinic should you have any questions or concerns. The clinic phone number is [phone].  Please show the CHEMO ALERT CARD at check-in to the Emergency Department and triage nurse.   

## 2020-04-22 NOTE — Progress Notes (Signed)
Patient presents today for 2 UPRBC.  Vital signs stable.  Patient complains of some dyspnea when climbing stairs but no other complaints.

## 2020-04-22 NOTE — Progress Notes (Signed)
CRITICAL VALUE ALERT  Critical Value:  ANC 0.4  Date & Time Notied:  04/22/2020 at 0838 (labs drawn 04/21/2020)  Provider Notified: Dr. Burr Medico  Orders Received/Actions taken: n/a

## 2020-04-22 NOTE — Progress Notes (Signed)
Two UPRBC given today per MD orders.  Tolerated infusion without adverse affects.  Vital signs stable.  No complaints at this time.  Discharge from clinic ambulatory in stable condition.  Alert and oriented X 3.  Follow up with [facility] as scheduled.

## 2020-04-23 LAB — BPAM RBC
Blood Product Expiration Date: 202112202359
Blood Product Expiration Date: 202112212359
ISSUE DATE / TIME: 202112140851
ISSUE DATE / TIME: 202112141051
Unit Type and Rh: 5100
Unit Type and Rh: 5100

## 2020-04-23 LAB — TYPE AND SCREEN
ABO/RH(D): O POS
Antibody Screen: NEGATIVE
Unit division: 0
Unit division: 0

## 2020-04-28 ENCOUNTER — Encounter (HOSPITAL_COMMUNITY): Payer: Self-pay

## 2020-04-28 ENCOUNTER — Other Ambulatory Visit: Payer: Self-pay

## 2020-04-28 ENCOUNTER — Telehealth (HOSPITAL_COMMUNITY): Payer: Self-pay | Admitting: Surgery

## 2020-04-28 ENCOUNTER — Encounter (HOSPITAL_COMMUNITY): Payer: 59

## 2020-04-28 ENCOUNTER — Inpatient Hospital Stay (HOSPITAL_COMMUNITY): Payer: 59

## 2020-04-28 VITALS — BP 119/80 | HR 87 | Temp 97.0°F | Resp 18 | Wt 233.0 lb

## 2020-04-28 DIAGNOSIS — D46Z Other myelodysplastic syndromes: Secondary | ICD-10-CM

## 2020-04-28 DIAGNOSIS — D649 Anemia, unspecified: Secondary | ICD-10-CM

## 2020-04-28 DIAGNOSIS — C9310 Chronic myelomonocytic leukemia not having achieved remission: Secondary | ICD-10-CM | POA: Diagnosis not present

## 2020-04-28 LAB — CBC WITH DIFFERENTIAL/PLATELET
Abs Immature Granulocytes: 1.02 10*3/uL — ABNORMAL HIGH (ref 0.00–0.07)
Band Neutrophils: 0 %
Basophils Absolute: 0 10*3/uL (ref 0.0–0.1)
Basophils Relative: 0 %
Blasts: 0 %
Eosinophils Absolute: 0.4 10*3/uL (ref 0.0–0.5)
Eosinophils Relative: 5 %
HCT: 23.7 % — ABNORMAL LOW (ref 39.0–52.0)
Hemoglobin: 7.5 g/dL — ABNORMAL LOW (ref 13.0–17.0)
Lymphocytes Relative: 37 %
Lymphs Abs: 2.7 10*3/uL (ref 0.7–4.0)
MCH: 28.7 pg (ref 26.0–34.0)
MCHC: 31.6 g/dL (ref 30.0–36.0)
MCV: 90.8 fL (ref 80.0–100.0)
Metamyelocytes Relative: 3 %
Monocytes Absolute: 2 10*3/uL — ABNORMAL HIGH (ref 0.1–1.0)
Monocytes Relative: 27 %
Myelocytes: 11 %
Neutro Abs: 1.2 10*3/uL — ABNORMAL LOW (ref 1.7–7.7)
Neutrophils Relative %: 17 %
Other: 0 %
Platelets: 15 10*3/uL — CL (ref 150–400)
Promyelocytes Relative: 0 %
RBC: 2.61 MIL/uL — ABNORMAL LOW (ref 4.22–5.81)
RDW: 14.6 % (ref 11.5–15.5)
WBC: 7.3 10*3/uL (ref 4.0–10.5)
nRBC: 0 % (ref 0.0–0.2)
nRBC: 0 /100 WBC

## 2020-04-28 LAB — PREPARE RBC (CROSSMATCH)

## 2020-04-28 LAB — SAMPLE TO BLOOD BANK

## 2020-04-28 MED ORDER — LANREOTIDE ACETATE 120 MG/0.5ML ~~LOC~~ SOLN
SUBCUTANEOUS | Status: AC
  Filled 2020-04-28: qty 120

## 2020-04-28 MED ORDER — HEPARIN SOD (PORK) LOCK FLUSH 100 UNIT/ML IV SOLN
500.0000 [IU] | Freq: Once | INTRAVENOUS | Status: AC
  Administered 2020-04-28: 500 [IU] via INTRAVENOUS

## 2020-04-28 MED ORDER — SODIUM CHLORIDE 0.9% FLUSH
10.0000 mL | Freq: Once | INTRAVENOUS | Status: AC
  Administered 2020-04-28: 10 mL via INTRAVENOUS

## 2020-04-28 NOTE — Progress Notes (Signed)
Labs drawn from port.  Patients port flushed without difficulty.  Good blood return noted with no bruising or swelling noted at site.  Band aid applied.  VSS with discharge and left in satisfactory condition with no s/s of distress noted.  

## 2020-04-28 NOTE — Telephone Encounter (Signed)
..  CRITICAL VALUE ALERT Critical value received:  Platelts: 15 Date of notification:  04/28/20 Time of notification: 0857 Critical value read back:  yes Nurse who received alert:  Alferd Obryant MD notified time and response:  I notified Anastasio Champion, charge RN, of these results. Per Ebony Hail, no action needed.

## 2020-04-29 ENCOUNTER — Inpatient Hospital Stay (HOSPITAL_COMMUNITY): Payer: 59

## 2020-04-29 ENCOUNTER — Encounter (HOSPITAL_COMMUNITY): Payer: Self-pay

## 2020-04-29 DIAGNOSIS — D46Z Other myelodysplastic syndromes: Secondary | ICD-10-CM

## 2020-04-29 DIAGNOSIS — D649 Anemia, unspecified: Secondary | ICD-10-CM

## 2020-04-29 DIAGNOSIS — C9310 Chronic myelomonocytic leukemia not having achieved remission: Secondary | ICD-10-CM | POA: Diagnosis not present

## 2020-04-29 MED ORDER — SODIUM CHLORIDE 0.9% FLUSH
10.0000 mL | INTRAVENOUS | Status: AC | PRN
  Administered 2020-04-29: 10 mL

## 2020-04-29 MED ORDER — HEPARIN SOD (PORK) LOCK FLUSH 100 UNIT/ML IV SOLN
500.0000 [IU] | Freq: Every day | INTRAVENOUS | Status: AC | PRN
  Administered 2020-04-29: 500 [IU]

## 2020-04-29 MED ORDER — DIPHENHYDRAMINE HCL 25 MG PO CAPS: 25.0000 mg | ORAL_CAPSULE | Freq: Once | ORAL | Status: DC

## 2020-04-29 MED ORDER — ACETAMINOPHEN 325 MG PO TABS: 650.0000 mg | ORAL_TABLET | Freq: Once | ORAL | Status: DC

## 2020-04-29 MED ORDER — SODIUM CHLORIDE 0.9% IV SOLUTION
250.0000 mL | Freq: Once | INTRAVENOUS | Status: AC
  Administered 2020-04-29: 250 mL via INTRAVENOUS

## 2020-04-29 NOTE — Progress Notes (Signed)
[facility] given today per MD orders.  Tolerated infusion without adverse affects.  Vital signs stable.  No complaints at this time.  Discharge from clinic ambulatory in stable condition.  Alert and oriented X 3.  Follow up with [facility] as scheduled.

## 2020-04-29 NOTE — Patient Instructions (Signed)
[facility] Discharge Instructions for Patients Receiving Chemotherapy  Today you received the following chemotherapy agents   To help prevent nausea and vomiting after your treatment, we encourage you to take your nausea medication   If you develop nausea and vomiting that is not controlled by your nausea medication, call the clinic.   BELOW ARE SYMPTOMS THAT SHOULD BE REPORTED IMMEDIATELY:  *FEVER GREATER THAN 100.5 F  *CHILLS WITH OR WITHOUT FEVER  NAUSEA AND VOMITING THAT IS NOT CONTROLLED WITH YOUR NAUSEA MEDICATION  *UNUSUAL SHORTNESS OF BREATH  *UNUSUAL BRUISING OR BLEEDING  TENDERNESS IN MOUTH AND THROAT WITH OR WITHOUT PRESENCE OF ULCERS  *URINARY PROBLEMS  *BOWEL PROBLEMS  UNUSUAL RASH Items with * indicate a potential emergency and should be followed up as soon as possible.  Feel free to call the clinic should you have any questions or concerns. The clinic phone number is [phone].  Please show the CHEMO ALERT CARD at check-in to the Emergency Department and triage nurse.   

## 2020-04-29 NOTE — Progress Notes (Signed)
Patient presents today for 1 Unit of PRBC's. Vital signs are stable. Patient denies pain today. Patient has no complaints of any significant changes since his last visit. Patient denies shortness of breath. MAR reviewed and updated.

## 2020-04-30 LAB — TYPE AND SCREEN
ABO/RH(D): O POS
Antibody Screen: NEGATIVE
Unit division: 0

## 2020-04-30 LAB — BPAM RBC
Blood Product Expiration Date: 202201092359
ISSUE DATE / TIME: 202112210834
Unit Type and Rh: 5100

## 2020-05-05 ENCOUNTER — Inpatient Hospital Stay (HOSPITAL_COMMUNITY): Payer: 59

## 2020-05-05 ENCOUNTER — Other Ambulatory Visit: Payer: Self-pay

## 2020-05-05 ENCOUNTER — Inpatient Hospital Stay (HOSPITAL_BASED_OUTPATIENT_CLINIC_OR_DEPARTMENT_OTHER): Payer: 59 | Admitting: Hematology

## 2020-05-05 VITALS — BP 110/64 | HR 85 | Temp 97.5°F | Resp 18

## 2020-05-05 VITALS — BP 127/70 | HR 112 | Temp 97.9°F | Resp 18

## 2020-05-05 DIAGNOSIS — D46Z Other myelodysplastic syndromes: Secondary | ICD-10-CM | POA: Diagnosis not present

## 2020-05-05 DIAGNOSIS — D696 Thrombocytopenia, unspecified: Secondary | ICD-10-CM | POA: Diagnosis not present

## 2020-05-05 DIAGNOSIS — D539 Nutritional anemia, unspecified: Secondary | ICD-10-CM

## 2020-05-05 DIAGNOSIS — Z95828 Presence of other vascular implants and grafts: Secondary | ICD-10-CM

## 2020-05-05 DIAGNOSIS — C9310 Chronic myelomonocytic leukemia not having achieved remission: Secondary | ICD-10-CM | POA: Diagnosis not present

## 2020-05-05 LAB — COMPREHENSIVE METABOLIC PANEL
ALT: 47 U/L — ABNORMAL HIGH (ref 0–44)
AST: 29 U/L (ref 15–41)
Albumin: 4 g/dL (ref 3.5–5.0)
Alkaline Phosphatase: 45 U/L (ref 38–126)
Anion gap: 6 (ref 5–15)
BUN: 15 mg/dL (ref 6–20)
CO2: 26 mmol/L (ref 22–32)
Calcium: 9.2 mg/dL (ref 8.9–10.3)
Chloride: 102 mmol/L (ref 98–111)
Creatinine, Ser: 1.01 mg/dL (ref 0.61–1.24)
GFR, Estimated: >60 mL/min (ref >60)
Glucose, Bld: 124 mg/dL — ABNORMAL HIGH (ref 70–99)
Potassium: 4.2 mmol/L (ref 3.5–5.1)
Sodium: 134 mmol/L — ABNORMAL LOW (ref 135–145)
Total Bilirubin: 0.7 mg/dL (ref 0.3–1.2)
Total Protein: 6.9 g/dL (ref 6.5–8.1)

## 2020-05-05 LAB — SAMPLE TO BLOOD BANK

## 2020-05-05 LAB — CBC WITH DIFFERENTIAL/PLATELET
Band Neutrophils: 7 %
Basophils Absolute: 0.3 10*3/uL — ABNORMAL HIGH (ref 0.0–0.1)
Basophils Relative: 1 %
Blasts: 2 %
Eosinophils Absolute: 0.3 10*3/uL (ref 0.0–0.5)
Eosinophils Relative: 1 %
HCT: 24.9 % — ABNORMAL LOW (ref 39.0–52.0)
Hemoglobin: 8 g/dL — ABNORMAL LOW (ref 13.0–17.0)
Lymphocytes Relative: 12 %
Lymphs Abs: 3.7 10*3/uL (ref 0.7–4.0)
MCH: 29 pg (ref 26.0–34.0)
MCHC: 32.1 g/dL (ref 30.0–36.0)
MCV: 90.2 fL (ref 80.0–100.0)
Metamyelocytes Relative: 8 %
Monocytes Absolute: 10.4 10*3/uL — ABNORMAL HIGH (ref 0.1–1.0)
Monocytes Relative: 34 %
Myelocytes: 6 %
Neutro Abs: 11 10*3/uL — ABNORMAL HIGH (ref 1.7–7.7)
Neutrophils Relative %: 29 %
Platelets: 19 10*3/uL — CL (ref 150–400)
RBC: 2.76 MIL/uL — ABNORMAL LOW (ref 4.22–5.81)
RDW: 14.5 % (ref 11.5–15.5)
WBC: 30.5 10*3/uL — ABNORMAL HIGH (ref 4.0–10.5)
nRBC: 0 % (ref 0.0–0.2)

## 2020-05-05 LAB — LACTATE DEHYDROGENASE: LDH: 299 U/L — ABNORMAL HIGH (ref 98–192)

## 2020-05-05 MED ORDER — SODIUM CHLORIDE 0.9 % IV SOLN: Freq: Once | INTRAVENOUS | Status: AC

## 2020-05-05 MED ORDER — SODIUM CHLORIDE 0.9 % IV SOLN
10.0000 mg | Freq: Once | INTRAVENOUS | Status: AC
  Administered 2020-05-05: 10 mg via INTRAVENOUS
  Filled 2020-05-05: qty 10

## 2020-05-05 MED ORDER — HEPARIN SOD (PORK) LOCK FLUSH 100 UNIT/ML IV SOLN
500.0000 [IU] | Freq: Once | INTRAVENOUS | Status: AC | PRN
  Administered 2020-05-05: 500 [IU]

## 2020-05-05 MED ORDER — SODIUM CHLORIDE 0.9 % IV SOLN
100.0000 mg/m2 | Freq: Once | INTRAVENOUS | Status: AC
  Administered 2020-05-05: 225 mg via INTRAVENOUS
  Filled 2020-05-05: qty 22.5

## 2020-05-05 MED ORDER — SODIUM CHLORIDE 0.9% FLUSH
10.0000 mL | INTRAVENOUS | Status: DC | PRN
  Administered 2020-05-05: 10 mL

## 2020-05-05 MED ORDER — PALONOSETRON HCL INJECTION 0.25 MG/5ML
0.2500 mg | Freq: Once | INTRAVENOUS | Status: AC
  Administered 2020-05-05: 0.25 mg via INTRAVENOUS
  Filled 2020-05-05: qty 5

## 2020-05-05 NOTE — Progress Notes (Signed)
Patients port flushed without difficulty.  Good blood return noted with no bruising or swelling noted at site.  Transparent dressing applied.  Patient left accessed for chemotherapy treatment. 

## 2020-05-05 NOTE — Progress Notes (Signed)
Hunter Smith, [place] [id number]   CLINIC:  Medical Oncology/Hematology  PCP:  No primary care provider on file. No primary physician on file. None   REASON FOR VISIT:  Follow-up for MDS, macrocytic anemia and thrombocytopenia  PRIOR THERAPY: None  NGS Results: Not done  CURRENT THERAPY: Azacitidine every 4 weeks  BRIEF ONCOLOGIC HISTORY:  Oncology History  Myelodysplastic syndrome, high grade (HCC)  12/28/2019 Initial Diagnosis   MDS (myelodysplastic syndrome), high grade ([place])   12/31/2019 -  Chemotherapy   The patient had palonosetron (ALOXI) injection 0.25 mg, 0.25 mg, Intravenous,  Once, 4 of 6 cycles Administration: 0.25 mg (01/02/2020), 0.25 mg (01/04/2020), 0.25 mg (01/07/2020), 0.25 mg (02/05/2020), 0.25 mg (02/07/2020), 0.25 mg (02/11/2020), 0.25 mg (03/03/2020), 0.25 mg (03/05/2020), 0.25 mg (03/07/2020), 0.25 mg (03/10/2020), 0.25 mg (02/13/2020), 0.25 mg (04/07/2020), 0.25 mg (04/09/2020), 0.25 mg (04/11/2020) azaCITIDine (VIDAZA) 170 mg in sodium chloride 0.9 % 50 mL chemo infusion, 75 mg/m2 = 170 mg, Intravenous, Once, 4 of 6 cycles Dose modification: 100 mg/m2 (original dose 75 mg/m2, Cycle 4, Reason: Provider Judgment) Administration: 170 mg (12/31/2019), 170 mg (01/01/2020), 170 mg (01/02/2020), 170 mg (01/03/2020), 170 mg (01/04/2020), 170 mg (01/07/2020), 170 mg (01/08/2020), 170 mg (02/05/2020), 170 mg (02/06/2020), 170 mg (02/07/2020), 170 mg (02/08/2020), 170 mg (02/11/2020), 170 mg (02/12/2020), 170 mg (02/13/2020), 170 mg (03/03/2020), 170 mg (03/04/2020), 170 mg (03/05/2020), 170 mg (03/06/2020), 170 mg (03/07/2020), 170 mg (03/10/2020), 170 mg (03/11/2020), 225 mg (04/07/2020), 225 mg (04/08/2020), 225 mg (04/09/2020), 225 mg (04/10/2020), 225 mg (04/11/2020), 225 mg (04/14/2020), 225 mg (04/15/2020)  for chemotherapy treatment.      CANCER STAGING: Cancer Staging No matching staging information was found for the patient.  INTERVAL HISTORY:  Mr.  Hunter Smith, a 59 y.o. male, returns for routine follow-up and consideration for next cycle of chemotherapy. Hunter Smith was last seen on 04/07/2020.  Due for cycle #5 of azacitidine today.   Today he is accompanied by his wife. Overall, he tells me he has been feeling pretty well. He reports having some sinus drainage which occurs every winter due to the weather changes, but denies change in color of the discharge, F/C, infections, N/V/D/C. He tolerated the previous treatment well. He reports having some nosebleeds after blowing his nose forcefully. He felt better after receiving 1 unit of PRBC on 12/20. He denies having any new cough, lightheadedness or CP.  His next appointment at [facility] is on 1/18.  Overall, he feels ready for next cycle of chemo today.    REVIEW OF SYSTEMS:  Review of Systems  Constitutional: Positive for fatigue (75%). Negative for appetite change, chills and fever.  HENT:         Sinus drainage  Respiratory: Negative for cough.   Cardiovascular: Negative for chest pain.  Gastrointestinal: Negative for constipation, diarrhea, nausea and vomiting.  Neurological: Negative for light-headedness.  All other systems reviewed and are negative.   PAST MEDICAL/SURGICAL HISTORY:  Past Medical History:  Diagnosis Date  . Cancer ([place])   . Port-A-Cath in place 12/28/2019   Past Surgical History:  Procedure Laterality Date  . COLONOSCOPY    . PORTACATH PLACEMENT Right 01/18/2020   Procedure: INSERTION PORT-A-CATH;  Surgeon: Aviva Signs, MD;  Location: [facility];  Service: General;  Laterality: Right;    SOCIAL HISTORY:  Social History   Socioeconomic History  . Marital status: Married    Spouse name: Not on file  . Number of children:  Not on file  . Years of education: Not on file  . Highest education level: Not on file  Occupational History  . Occupation: Employed  Tobacco Use  . Smoking status: Former Smoker    Types: Cigarettes    Quit date: 05/03/1988     Years since quitting: 32.0  . Smokeless tobacco: Never Used  Substance and Sexual Activity  . Alcohol use: Yes    Alcohol/week: 1.0 - 2.0 standard drink    Types: 1 - 2 Standard drinks or equivalent per week  . Drug use: Never  . Sexual activity: Yes  Other Topics Concern  . Not on file  Social History Narrative  . Not on file   Social Determinants of Health   Financial Resource Strain: Low Risk   . Difficulty of Paying Living Expenses: Not hard at all  Food Insecurity: No Food Insecurity  . Worried About Charity fundraiser in the Last Year: Never true  . Ran Out of Food in the Last Year: Never true  Transportation Needs: No Transportation Needs  . Lack of Transportation (Medical): No  . Lack of Transportation (Non-Medical): No  Physical Activity: Inactive  . Days of Exercise per Week: 0 days  . Minutes of Exercise per Session: 0 min  Stress: No Stress Concern Present  . Feeling of Stress : Not at all  Social Connections: Moderately Integrated  . Frequency of Communication with Friends and Family: More than three times a week  . Frequency of Social Gatherings with Friends and Family: Three times a week  . Attends Religious Services: 1 to 4 times per year  . Active Member of Clubs or Organizations: No  . Attends Archivist Meetings: Never  . Marital Status: Married  Human resources officer Violence: Not At Risk  . Fear of Current or Ex-Partner: No  . Emotionally Abused: No  . Physically Abused: No  . Sexually Abused: No    FAMILY HISTORY:  Family History  Problem Relation Age of Onset  . Bladder Cancer Father   . Scoliosis Sister     CURRENT MEDICATIONS:  Current Outpatient Medications  Medication Sig Dispense Refill  . azaCITIDine 5 mg/2 mLs in lactated ringers infusion Inject into the vein daily. Days 1-7 every 28 days    . docusate sodium (COLACE) 100 MG capsule Take 100 mg by mouth 2 (two) times daily.    Marland Kitchen lidocaine-prilocaine (EMLA) cream Apply a small  amount to port a cath site and cover with plastic wrap 1 hour prior to chemotherapy appointments 30 g 3  . Multiple Vitamin (MULTIVITAMIN) tablet Take 1 tablet by mouth daily.    . prochlorperazine (COMPAZINE) 10 MG tablet Take 1 tablet (10 mg total) by mouth every 6 (six) hours as needed (Nausea or vomiting). 30 tablet 1   No current facility-administered medications for this visit.    ALLERGIES:  No Known Allergies  PHYSICAL EXAM:  Performance status (ECOG): 1 - Symptomatic but completely ambulatory  Vitals:   05/05/20 1011  BP: 127/70  Pulse: (!) 112  Resp: 18  Temp: 97.9 F (36.6 C)  SpO2: 99%   Wt Readings from Last 3 Encounters:  05/05/20 231 lb (104.8 kg)  04/28/20 233 lb (105.7 kg)  04/14/20 230 lb 9.6 oz (104.6 kg)   Physical Exam Vitals reviewed.  Constitutional:      Appearance: Normal appearance. He is obese.  HENT:     Nose:     Right Sinus: No maxillary sinus tenderness.  Left Sinus: No maxillary sinus tenderness.  Cardiovascular:     Rate and Rhythm: Normal rate and regular rhythm.     Pulses: Normal pulses.     Heart sounds: Normal heart sounds.  Pulmonary:     Effort: Pulmonary effort is normal.     Breath sounds: Normal breath sounds.  Chest:     Comments: Port-a-Cath in R chest Abdominal:     Palpations: Abdomen is soft. There is no hepatomegaly, splenomegaly or mass.     Tenderness: There is no abdominal tenderness.  Neurological:     General: No focal deficit present.     Mental Status: He is alert and oriented to person, place, and time.  Psychiatric:        Mood and Affect: Mood normal.        Behavior: Behavior normal.     LABORATORY DATA:  I have reviewed the labs as listed.  CBC Latest Ref Rng & Units 05/05/2020 04/28/2020 04/21/2020  WBC 4.0 - 10.5 K/uL 30.5(H) 7.3 2.2(L)  Hemoglobin 13.0 - 17.0 g/dL 8.0(L) 7.5(L) 6.6(LL)  Hematocrit 39.0 - 52.0 % 24.9(L) 23.7(L) 20.4(L)  Platelets 150 - 400 K/uL 19(LL) 15(LL) 15(LL)   CMP  Latest Ref Rng & Units 05/05/2020 04/14/2020 04/07/2020  Glucose 70 - 99 mg/dL 124(H) 107(H) 113(H)  BUN 6 - 20 mg/dL 15 22(H) 19  Creatinine 0.61 - 1.24 mg/dL 1.01 1.01 0.92  Sodium 135 - 145 mmol/L 134(L) 135 136  Potassium 3.5 - 5.1 mmol/L 4.2 4.1 4.1  Chloride 98 - 111 mmol/L 102 103 105  CO2 22 - 32 mmol/L 26 26 25   Calcium 8.9 - 10.3 mg/dL 9.2 8.6(L) 8.9  Total Protein 6.5 - 8.1 g/dL 6.9 6.1(L) 6.7  Total Bilirubin 0.3 - 1.2 mg/dL 0.7 0.6 0.7  Alkaline Phos 38 - 126 U/L 45 37(L) 41  AST 15 - 41 U/L 29 24 35  ALT 0 - 44 U/L 47(H) 60(H) 66(H)   Lab Results  Component Value Date   LDH 299 (H) 05/05/2020   LDH 233 (H) 04/07/2020   LDH 204 (H) 03/24/2020    DIAGNOSTIC IMAGING:  I have independently reviewed the scans and discussed with the patient. No results found.   ASSESSMENT:  1.High-grade MDS with excess blasts/CMML: -Presentation with pancytopenia and transfusion dependent anemia. -Bone marrow biopsy on 12/14/2019 at [facility] showed hypercellular marrow (90%) with dyserythropoiesis, dysgranulopoiesis and 16% blasts. Findings consistent with MDS-EB 2. FLT3 ITD/TKD negative. -Chromosome analysis 47, XY, +8[19]/46, XY[1]. -MDS FISH panel pending. -However his last 2 CBC showing increased white count of 14-15 K with peripheral blood monocytosis, also likely CMML. -He was evaluated by Dr. Jerrye Noble at [facility]. Bone marrow biopsy after cycle 2 at [facility] recommended. -Cycle 1 of azacitidine (5+2) on 12/31/2019. 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 Current biopsy shows similar hypercellular marrow with myeloid and erythroid dysplasia and blast fraction has decreased from 16% to 5-10%. -Bone marrow biopsy on 03/25/2020 shows hypercellular marrow (95% cellularity) involved by CMML, 5 to 10% blasts.  Compared to bone marrow biopsy from 02/01/2020, blasts were similar. -Cycle 4 of azacitidine dose increased to 100 mg per metered squared  on 04/07/2020.   PLAN:  1.High-grade MDS/CMML: -He has tolerated increased dose of azacitidine at 100 mg per metered squared reasonably well.  He noticed more fatigue.  Has mild sinus symptoms but without any signs of infection. -Reviewed labs today.  White count is 30.5 and platelets are  19.  Hemoglobin is 8.0.  Elevated neutrophils and monocytes were seen on differential.  LDH was 299. -He will proceed with his cycle 5 of azacitidine (100 mg/M square). -Bone marrow biopsy planned on 05/28/2019 at [place] 4 weeks for follow-up to discuss results and further plan.  2. Thrombocytopenia: -Platelet count today is 19.  No transfusion needed.  Has mild easy bruising on the upper extremities.  3. Macrocytic anemia: -Hemoglobin today is 8.0.  We will continue weekly labs and transfuse irradiated PRBC as needed.   Orders placed this encounter:  No orders of the defined types were placed in this encounter.    Derek Jack, MD [place] [phone]   I, Milinda Antis, am acting as a scribe for Dr. Sanda Linger.  I, Derek Jack MD, have reviewed the above documentation for accuracy and completeness, and I agree with the above.

## 2020-05-05 NOTE — Patient Instructions (Signed)
[facility] Discharge Instructions for Patients Receiving Chemotherapy   Beginning January 23rd 2017 lab work for the Cancer Center will be done in the  Main lab at [facility] on 1st floor. If you have a lab appointment with the Cancer Center please come in thru the  Main Entrance and check in at the main information desk   Today you received the following chemotherapy agents Vidaza. Follow-up as scheduled  To help prevent nausea and vomiting after your treatment, we encourage you to take your nausea medication   If you develop nausea and vomiting, or diarrhea that is not controlled by your medication, call the clinic.  The clinic phone number is [phone]. Office hours are Monday-Friday 8:30am-5:00pm.  BELOW ARE SYMPTOMS THAT SHOULD BE REPORTED IMMEDIATELY:  *FEVER GREATER THAN 101.0 F  *CHILLS WITH OR WITHOUT FEVER  NAUSEA AND VOMITING THAT IS NOT CONTROLLED WITH YOUR NAUSEA MEDICATION  *UNUSUAL SHORTNESS OF BREATH  *UNUSUAL BRUISING OR BLEEDING  TENDERNESS IN MOUTH AND THROAT WITH OR WITHOUT PRESENCE OF ULCERS  *URINARY PROBLEMS  *BOWEL PROBLEMS  UNUSUAL RASH Items with * indicate a potential emergency and should be followed up as soon as possible. If you have an emergency after office hours please contact your primary care physician or go to the nearest emergency department.  Please call the clinic during office hours if you have any questions or concerns.   You may also contact the Patient Navigator at [phone] should you have any questions or need assistance in obtaining follow up care.      Resources For Cancer Patients and their Caregivers ? American Cancer Society: Can assist with transportation, wigs, general needs, runs Look Good Feel Better.        1-888-227-6333 ? Cancer Care: Provides financial assistance, online support groups, medication/co-pay assistance.  1-800-813-HOPE (4673) ? [facility] Assists [place] cancer patients and their families through emotional , educational and financial support.  336-427-4357 ? [place] DSS Where to apply for food stamps, Medicaid and utility assistance. 336-342-1394 ? [facility]: Transportation to medical appointments. 336-347-2287 ? Social Security Administration: May apply for disability if have a Stage IV cancer. 336-342-7796 1-800-772-1213 ? [place] Aging, Disability and Transit Services: Assists with nutrition, care and transit needs. 336-349-2343         

## 2020-05-05 NOTE — Progress Notes (Signed)
1125 Labs reviewed with and pt seen by Dr. Ellin Saba and pt approved for Vidaza infusion today per MD                                    Hunter Smith tolerated Vidaza infusion well without complaints or incident. VSS upon discharge. Pt discharged self ambulatory in satisfactory condition

## 2020-05-05 NOTE — Patient Instructions (Signed)
[place] at [facility] Discharge Instructions  You were seen today by Dr. Delton Coombes. He went over your recent results. You received your treatment today. Keep your appointment at [facility] on 1/18. Dr. Delton Coombes will see you back in 4 weeks for labs and follow up.   Thank you for choosing [place] at [facility] to provide your oncology and hematology care.  To afford each patient quality time with our provider, please arrive at least 15 minutes before your scheduled appointment time.   If you have a lab appointment with the [facility] please come in thru the Main Entrance and check in at the main information desk  You need to re-schedule your appointment should you arrive 10 or more minutes late.  We strive to give you quality time with our providers, and arriving late affects you and other patients whose appointments are after yours.  Also, if you no show three or more times for appointments you may be dismissed from the clinic at the providers discretion.     Again, thank you for choosing [facility].  Our hope is that these requests will decrease the amount of time that you wait before being seen by our physicians.       _____________________________________________________________  Should you have questions after your visit to [facility], please contact our office at [phone] between the hours of 8:00 a.m. and 4:30 p.m.  Voicemails left after 4:00 p.m. will not be returned until the following business day.  For prescription refill requests, have your pharmacy contact our office and allow 72 hours.    Cancer Center Support Programs:   > Cancer Support Group  2nd Tuesday of the month 1pm-2pm, Journey Room

## 2020-05-06 ENCOUNTER — Encounter (HOSPITAL_COMMUNITY): Payer: Self-pay

## 2020-05-06 ENCOUNTER — Inpatient Hospital Stay (HOSPITAL_COMMUNITY): Payer: 59

## 2020-05-06 VITALS — BP 126/80 | HR 88 | Temp 97.7°F | Resp 18

## 2020-05-06 DIAGNOSIS — D46Z Other myelodysplastic syndromes: Secondary | ICD-10-CM

## 2020-05-06 DIAGNOSIS — C9310 Chronic myelomonocytic leukemia not having achieved remission: Secondary | ICD-10-CM | POA: Diagnosis not present

## 2020-05-06 DIAGNOSIS — Z95828 Presence of other vascular implants and grafts: Secondary | ICD-10-CM

## 2020-05-06 MED ORDER — SODIUM CHLORIDE 0.9 % IV SOLN
100.0000 mg/m2 | Freq: Once | INTRAVENOUS | Status: AC
  Administered 2020-05-06: 225 mg via INTRAVENOUS
  Filled 2020-05-06: qty 22.5

## 2020-05-06 MED ORDER — HEPARIN SOD (PORK) LOCK FLUSH 100 UNIT/ML IV SOLN
500.0000 [IU] | Freq: Once | INTRAVENOUS | Status: AC | PRN
  Administered 2020-05-06: 500 [IU]

## 2020-05-06 MED ORDER — SODIUM CHLORIDE 0.9% FLUSH
10.0000 mL | INTRAVENOUS | Status: DC | PRN
  Administered 2020-05-06 (×2): 10 mL

## 2020-05-06 MED ORDER — SODIUM CHLORIDE 0.9 % IV SOLN
10.0000 mg | Freq: Once | INTRAVENOUS | Status: AC
  Administered 2020-05-06: 10 mg via INTRAVENOUS
  Filled 2020-05-06: qty 10

## 2020-05-06 MED ORDER — SODIUM CHLORIDE 0.9 % IV SOLN: Freq: Once | INTRAVENOUS | Status: AC

## 2020-05-06 NOTE — Patient Instructions (Signed)
[facility] Discharge Instructions for Patients Receiving Chemotherapy   Beginning January 23rd 2017 lab work for the Cancer Center will be done in the  Main lab at [place] on 1st floor. If you have a lab appointment with the Cancer Center please come in thru the  Main Entrance and check in at the main information desk   Today you received the following chemotherapy agents Vidaza. Follow-up as scheduled  To help prevent nausea and vomiting after your treatment, we encourage you to take your nausea medication   If you develop nausea and vomiting, or diarrhea that is not controlled by your medication, call the clinic.  The clinic phone number is [phone]. Office hours are Monday-Friday 8:30am-5:00pm.  BELOW ARE SYMPTOMS THAT SHOULD BE REPORTED IMMEDIATELY:  *FEVER GREATER THAN 101.0 F  *CHILLS WITH OR WITHOUT FEVER  NAUSEA AND VOMITING THAT IS NOT CONTROLLED WITH YOUR NAUSEA MEDICATION  *UNUSUAL SHORTNESS OF BREATH  *UNUSUAL BRUISING OR BLEEDING  TENDERNESS IN MOUTH AND THROAT WITH OR WITHOUT PRESENCE OF ULCERS  *URINARY PROBLEMS  *BOWEL PROBLEMS  UNUSUAL RASH Items with * indicate a potential emergency and should be followed up as soon as possible. If you have an emergency after office hours please contact your primary care physician or go to the nearest emergency department.  Please call the clinic during office hours if you have any questions or concerns.   You may also contact the Patient Navigator at [phone] should you have any questions or need assistance in obtaining follow up care.      Resources For Cancer Patients and their Caregivers ? American Cancer Society: Can assist with transportation, wigs, general needs, runs Look Good Feel Better.        1-888-227-6333 ? Cancer Care: Provides financial assistance, online support groups, medication/co-pay assistance.  1-800-813-HOPE (4673) ? [facility] Assists [place] cancer patients and their families through emotional , educational and financial support.  336-427-4357 ? [place] DSS Where to apply for food stamps, Medicaid and utility assistance. 336-342-1394 ? [employer]: Transportation to medical appointments. 336-347-2287 ? Social Security Administration: May apply for disability if have a Stage IV cancer. 336-342-7796 1-800-772-1213 ? [place] [facility], Disability and Transit Services: Assists with nutrition, care and transit needs. [phone]         

## 2020-05-06 NOTE — Progress Notes (Signed)
Hunter Smith tolerated Vidaza infusion well without complaints or incident. VSS upon discharge. Pt discharged self ambulatory in satisfactory condition

## 2020-05-07 ENCOUNTER — Inpatient Hospital Stay (HOSPITAL_COMMUNITY): Payer: 59

## 2020-05-07 ENCOUNTER — Other Ambulatory Visit: Payer: Self-pay

## 2020-05-07 ENCOUNTER — Encounter (HOSPITAL_COMMUNITY): Payer: Self-pay

## 2020-05-07 VITALS — BP 128/82 | HR 87 | Temp 97.9°F | Resp 18

## 2020-05-07 DIAGNOSIS — D46Z Other myelodysplastic syndromes: Secondary | ICD-10-CM

## 2020-05-07 DIAGNOSIS — Z95828 Presence of other vascular implants and grafts: Secondary | ICD-10-CM

## 2020-05-07 DIAGNOSIS — C9310 Chronic myelomonocytic leukemia not having achieved remission: Secondary | ICD-10-CM | POA: Diagnosis not present

## 2020-05-07 MED ORDER — SODIUM CHLORIDE 0.9 % IV SOLN
100.0000 mg/m2 | Freq: Once | INTRAVENOUS | Status: AC
  Administered 2020-05-07: 225 mg via INTRAVENOUS
  Filled 2020-05-07: qty 22.5

## 2020-05-07 MED ORDER — PALONOSETRON HCL INJECTION 0.25 MG/5ML
0.2500 mg | Freq: Once | INTRAVENOUS | Status: AC
  Administered 2020-05-07: 0.25 mg via INTRAVENOUS
  Filled 2020-05-07: qty 5

## 2020-05-07 MED ORDER — SODIUM CHLORIDE 0.9% FLUSH
10.0000 mL | INTRAVENOUS | Status: DC | PRN
  Administered 2020-05-07 (×2): 10 mL

## 2020-05-07 MED ORDER — SODIUM CHLORIDE 0.9 % IV SOLN: Freq: Once | INTRAVENOUS | Status: AC

## 2020-05-07 MED ORDER — SODIUM CHLORIDE 0.9 % IV SOLN
10.0000 mg | Freq: Once | INTRAVENOUS | Status: AC
  Administered 2020-05-07: 10 mg via INTRAVENOUS
  Filled 2020-05-07: qty 10

## 2020-05-07 MED ORDER — HEPARIN SOD (PORK) LOCK FLUSH 100 UNIT/ML IV SOLN
500.0000 [IU] | Freq: Once | INTRAVENOUS | Status: AC | PRN
  Administered 2020-05-07: 500 [IU]

## 2020-05-07 NOTE — Patient Instructions (Signed)
[facility] Discharge Instructions for Patients Receiving Chemotherapy   Beginning January 23rd 2017 lab work for the Cancer Center will be done in the  Main lab at [place] on 1st floor. If you have a lab appointment with the Cancer Center please come in thru the  Main Entrance and check in at the main information desk   Today you received the following chemotherapy agents Vidaza. Follow-up as scheduled  To help prevent nausea and vomiting after your treatment, we encourage you to take your nausea medication   If you develop nausea and vomiting, or diarrhea that is not controlled by your medication, call the clinic.  The clinic phone number is [phone]. Office hours are Monday-Friday 8:30am-5:00pm.  BELOW ARE SYMPTOMS THAT SHOULD BE REPORTED IMMEDIATELY:  *FEVER GREATER THAN 101.0 F  *CHILLS WITH OR WITHOUT FEVER  NAUSEA AND VOMITING THAT IS NOT CONTROLLED WITH YOUR NAUSEA MEDICATION  *UNUSUAL SHORTNESS OF BREATH  *UNUSUAL BRUISING OR BLEEDING  TENDERNESS IN MOUTH AND THROAT WITH OR WITHOUT PRESENCE OF ULCERS  *URINARY PROBLEMS  *BOWEL PROBLEMS  UNUSUAL RASH Items with * indicate a potential emergency and should be followed up as soon as possible. If you have an emergency after office hours please contact your primary care physician or go to the nearest emergency department.  Please call the clinic during office hours if you have any questions or concerns.   You may also contact the Patient Navigator at [phone] should you have any questions or need assistance in obtaining follow up care.      Resources For Cancer Patients and their Caregivers ? American Cancer Society: Can assist with transportation, wigs, general needs, runs Look Good Feel Better.        1-888-227-6333 ? Cancer Care: Provides financial assistance, online support groups, medication/co-pay assistance.  1-800-813-HOPE (4673) ? [facility] Assists [place] cancer patients and their families through emotional , educational and financial support.  336-427-4357 ? [place] DSS Where to apply for food stamps, Medicaid and utility assistance. 336-342-1394 ? [employer]: Transportation to medical appointments. 336-347-2287 ? Social Security Administration: May apply for disability if have a Stage IV cancer. 336-342-7796 1-800-772-1213 ? [place] [facility], Disability and Transit Services: Assists with nutrition, care and transit needs. 336-349-2343         

## 2020-05-07 NOTE — Progress Notes (Signed)
Hunter Smith tolerated Vidaza infusion well without complaints or incident. VSS upon discharge. Pt discharged self ambulatory in satisfactory condition 

## 2020-05-08 ENCOUNTER — Inpatient Hospital Stay (HOSPITAL_COMMUNITY): Payer: 59

## 2020-05-08 ENCOUNTER — Encounter (HOSPITAL_COMMUNITY): Payer: Self-pay

## 2020-05-08 VITALS — BP 139/81 | HR 89 | Temp 97.8°F | Resp 18

## 2020-05-08 DIAGNOSIS — C9310 Chronic myelomonocytic leukemia not having achieved remission: Secondary | ICD-10-CM | POA: Diagnosis not present

## 2020-05-08 DIAGNOSIS — D46Z Other myelodysplastic syndromes: Secondary | ICD-10-CM

## 2020-05-08 DIAGNOSIS — Z95828 Presence of other vascular implants and grafts: Secondary | ICD-10-CM

## 2020-05-08 MED ORDER — SODIUM CHLORIDE 0.9 % IV SOLN: Freq: Once | INTRAVENOUS | Status: AC

## 2020-05-08 MED ORDER — AZACITIDINE CHEMO INJECTION 100 MG
100.0000 mg/m2 | Freq: Once | INTRAMUSCULAR | Status: AC
  Administered 2020-05-08: 225 mg via INTRAVENOUS
  Filled 2020-05-08: qty 22.5

## 2020-05-08 MED ORDER — SODIUM CHLORIDE 0.9 % IV SOLN
10.0000 mg | Freq: Once | INTRAVENOUS | Status: AC
  Administered 2020-05-08: 10 mg via INTRAVENOUS
  Filled 2020-05-08: qty 10

## 2020-05-08 MED ORDER — SODIUM CHLORIDE 0.9% FLUSH
10.0000 mL | INTRAVENOUS | Status: DC | PRN
  Administered 2020-05-08 (×2): 10 mL

## 2020-05-08 MED ORDER — HEPARIN SOD (PORK) LOCK FLUSH 100 UNIT/ML IV SOLN
500.0000 [IU] | Freq: Once | INTRAVENOUS | Status: AC | PRN
  Administered 2020-05-08: 500 [IU]

## 2020-05-08 NOTE — Patient Instructions (Signed)
[facility] Discharge Instructions for Patients Receiving Chemotherapy   Beginning January 23rd 2017 lab work for the Cancer Center will be done in the  Main lab at [place] on 1st floor. If you have a lab appointment with the Cancer Center please come in thru the  Main Entrance and check in at the main information desk   Today you received the following chemotherapy agents Vidaza. Follow-up as scheduled  To help prevent nausea and vomiting after your treatment, we encourage you to take your nausea medication   If you develop nausea and vomiting, or diarrhea that is not controlled by your medication, call the clinic.  The clinic phone number is [phone]. Office hours are Monday-Friday 8:30am-5:00pm.  BELOW ARE SYMPTOMS THAT SHOULD BE REPORTED IMMEDIATELY:  *FEVER GREATER THAN 101.0 F  *CHILLS WITH OR WITHOUT FEVER  NAUSEA AND VOMITING THAT IS NOT CONTROLLED WITH YOUR NAUSEA MEDICATION  *UNUSUAL SHORTNESS OF BREATH  *UNUSUAL BRUISING OR BLEEDING  TENDERNESS IN MOUTH AND THROAT WITH OR WITHOUT PRESENCE OF ULCERS  *URINARY PROBLEMS  *BOWEL PROBLEMS  UNUSUAL RASH Items with * indicate a potential emergency and should be followed up as soon as possible. If you have an emergency after office hours please contact your primary care physician or go to the nearest emergency department.  Please call the clinic during office hours if you have any questions or concerns.   You may also contact the Patient Navigator at [phone] should you have any questions or need assistance in obtaining follow up care.      Resources For Cancer Patients and their Caregivers ? American Cancer Society: Can assist with transportation, wigs, general needs, runs Look Good Feel Better.        1-888-227-6333 ? Cancer Care: Provides financial assistance, online support groups, medication/co-pay assistance.  1-800-813-HOPE (4673) ? [facility] Assists [place] cancer patients and their families through emotional , educational and financial support.  336-427-4357 ? [place] DSS Where to apply for food stamps, Medicaid and utility assistance. 336-342-1394 ? [employer]: Transportation to medical appointments. 336-347-2287 ? Social Security Administration: May apply for disability if have a Stage IV cancer. 336-342-7796 1-800-772-1213 ? [place] [facility]ging, Disability and Transit Services: Assists with nutrition, care and transit needs. [phone]         

## 2020-05-08 NOTE — Progress Notes (Signed)
Hunter Smith tolerated Vidaza infusion well without complaints or incident. VSS upon discharge. Pt discharged self ambulatory in satisfactory condition 

## 2020-05-12 ENCOUNTER — Encounter (HOSPITAL_COMMUNITY): Payer: Self-pay

## 2020-05-12 ENCOUNTER — Inpatient Hospital Stay (HOSPITAL_COMMUNITY): Payer: 59 | Attending: Hematology

## 2020-05-12 ENCOUNTER — Inpatient Hospital Stay (HOSPITAL_COMMUNITY): Payer: 59

## 2020-05-12 ENCOUNTER — Other Ambulatory Visit: Payer: Self-pay

## 2020-05-12 VITALS — BP 121/80 | HR 123 | Temp 97.5°F | Resp 18 | Wt 228.4 lb

## 2020-05-12 VITALS — BP 126/76 | HR 99 | Temp 98.1°F | Resp 18

## 2020-05-12 DIAGNOSIS — Z5111 Encounter for antineoplastic chemotherapy: Secondary | ICD-10-CM | POA: Insufficient documentation

## 2020-05-12 DIAGNOSIS — Z79899 Other long term (current) drug therapy: Secondary | ICD-10-CM | POA: Insufficient documentation

## 2020-05-12 DIAGNOSIS — D46Z Other myelodysplastic syndromes: Secondary | ICD-10-CM

## 2020-05-12 DIAGNOSIS — Z95828 Presence of other vascular implants and grafts: Secondary | ICD-10-CM

## 2020-05-12 DIAGNOSIS — C9310 Chronic myelomonocytic leukemia not having achieved remission: Secondary | ICD-10-CM | POA: Diagnosis present

## 2020-05-12 DIAGNOSIS — D649 Anemia, unspecified: Secondary | ICD-10-CM | POA: Insufficient documentation

## 2020-05-12 DIAGNOSIS — D7218 Eosinophilia in diseases classified elsewhere: Secondary | ICD-10-CM | POA: Insufficient documentation

## 2020-05-12 DIAGNOSIS — D696 Thrombocytopenia, unspecified: Secondary | ICD-10-CM | POA: Diagnosis not present

## 2020-05-12 LAB — CBC WITH DIFFERENTIAL/PLATELET
Abs Immature Granulocytes: 0.73 10*3/uL — ABNORMAL HIGH (ref 0.00–0.07)
Band Neutrophils: 2 %
Basophils Absolute: 0.1 10*3/uL (ref 0.0–0.1)
Basophils Relative: 1 %
Eosinophils Absolute: 0.5 10*3/uL (ref 0.0–0.5)
Eosinophils Relative: 4 %
HCT: 20.4 % — ABNORMAL LOW (ref 39.0–52.0)
Hemoglobin: 6.5 g/dL — CL (ref 13.0–17.0)
Lymphocytes Relative: 23 %
Lymphs Abs: 2.6 10*3/uL (ref 0.7–4.0)
MCH: 29 pg (ref 26.0–34.0)
MCHC: 31.9 g/dL (ref 30.0–36.0)
MCV: 91.1 fL (ref 80.0–100.0)
Metamyelocytes Relative: 4 %
Monocytes Absolute: 3 10*3/uL — ABNORMAL HIGH (ref 0.1–1.0)
Monocytes Relative: 26 %
Myelocytes: 3 %
Neutro Abs: 4.4 10*3/uL (ref 1.7–7.7)
Neutrophils Relative %: 37 %
Platelets: 20 10*3/uL — CL (ref 150–400)
RBC: 2.24 MIL/uL — ABNORMAL LOW (ref 4.22–5.81)
RDW: 14.4 % (ref 11.5–15.5)
WBC: 11.4 10*3/uL — ABNORMAL HIGH (ref 4.0–10.5)
nRBC: 0 % (ref 0.0–0.2)

## 2020-05-12 LAB — COMPREHENSIVE METABOLIC PANEL
ALT: 43 U/L (ref 0–44)
AST: 22 U/L (ref 15–41)
Albumin: 3.3 g/dL — ABNORMAL LOW (ref 3.5–5.0)
Alkaline Phosphatase: 40 U/L (ref 38–126)
Anion gap: 8 (ref 5–15)
BUN: 20 mg/dL (ref 6–20)
CO2: 25 mmol/L (ref 22–32)
Calcium: 8.6 mg/dL — ABNORMAL LOW (ref 8.9–10.3)
Chloride: 102 mmol/L (ref 98–111)
Creatinine, Ser: 0.85 mg/dL (ref 0.61–1.24)
GFR, Estimated: >60 mL/min (ref >60)
Glucose, Bld: 126 mg/dL — ABNORMAL HIGH (ref 70–99)
Potassium: 3.8 mmol/L (ref 3.5–5.1)
Sodium: 135 mmol/L (ref 135–145)
Total Bilirubin: 0.5 mg/dL (ref 0.3–1.2)
Total Protein: 6.3 g/dL — ABNORMAL LOW (ref 6.5–8.1)

## 2020-05-12 LAB — PREPARE RBC (CROSSMATCH)

## 2020-05-12 LAB — SAMPLE TO BLOOD BANK

## 2020-05-12 MED ORDER — SODIUM CHLORIDE 0.9 % IV SOLN
10.0000 mg | Freq: Once | INTRAVENOUS | Status: AC
  Administered 2020-05-12: 10 mg via INTRAVENOUS
  Filled 2020-05-12: qty 10

## 2020-05-12 MED ORDER — SODIUM CHLORIDE 0.9 % IV SOLN: Freq: Once | INTRAVENOUS | Status: AC

## 2020-05-12 MED ORDER — PALONOSETRON HCL INJECTION 0.25 MG/5ML
0.2500 mg | Freq: Once | INTRAVENOUS | Status: AC
  Administered 2020-05-12: 0.25 mg via INTRAVENOUS
  Filled 2020-05-12: qty 5

## 2020-05-12 MED ORDER — SODIUM CHLORIDE 0.9% FLUSH
10.0000 mL | Freq: Once | INTRAVENOUS | Status: AC
  Administered 2020-05-12: 10 mL via INTRAVENOUS

## 2020-05-12 MED ORDER — AMOXICILLIN 500 MG PO CAPS: 500.0000 mg | ORAL_CAPSULE | Freq: Three times a day (TID) | ORAL | 0 refills | Status: DC

## 2020-05-12 MED ORDER — HEPARIN SOD (PORK) LOCK FLUSH 100 UNIT/ML IV SOLN
500.0000 [IU] | Freq: Once | INTRAVENOUS | Status: AC | PRN
  Administered 2020-05-12: 500 [IU]

## 2020-05-12 MED ORDER — SODIUM CHLORIDE 0.9% FLUSH
10.0000 mL | INTRAVENOUS | Status: DC | PRN
  Administered 2020-05-12 (×2): 10 mL

## 2020-05-12 MED ORDER — SODIUM CHLORIDE 0.9 % IV SOLN
100.0000 mg/m2 | Freq: Once | INTRAVENOUS | Status: AC
  Administered 2020-05-12: 225 mg via INTRAVENOUS
  Filled 2020-05-12: qty 22.5

## 2020-05-12 NOTE — Progress Notes (Signed)
1115 Labs reviewed with Dr. Ellin Saba and pt approved for Vidaza infusion today with blood transfusion(2 units PRBC's) scheduled for tomorrow per MD                                                     Jadene Pierini tolerated Vidaza infusion well without complaints or incident. VSS upon discharge. Pt discharged self ambulatory in satisfactory condition

## 2020-05-12 NOTE — Progress Notes (Signed)
CRITICAL VALUE ALERT  Critical Value:  hgb 6.5; platelets 20  Date & Time Notied:  05/12/2020 at 1104  Provider Notified: Dr. Ellin Saba  Orders Received/Actions taken: proceed with Vidaza infusion today; transfuse 2 units irradiated PRBC tomorrow, 05/15/2020.

## 2020-05-12 NOTE — Progress Notes (Signed)
Patients port flushed without difficulty.  Good blood return noted with no bruising or swelling noted at site.  Transparent dressing applied.  Patient left accessed for chemotherapy treatment. 

## 2020-05-12 NOTE — Patient Instructions (Signed)
[facility] Discharge Instructions for Patients Receiving Chemotherapy   Beginning January 23rd 2017 lab work for the Cancer Center will be done in the  Main lab at [place] on 1st floor. If you have a lab appointment with the Cancer Center please come in thru the  Main Entrance and check in at the main information desk   Today you received the following chemotherapy agents Vidaza. Follow-up as scheduled  To help prevent nausea and vomiting after your treatment, we encourage you to take your nausea medication   If you develop nausea and vomiting, or diarrhea that is not controlled by your medication, call the clinic.  The clinic phone number is [phone]. Office hours are Monday-Friday 8:30am-5:00pm.  BELOW ARE SYMPTOMS THAT SHOULD BE REPORTED IMMEDIATELY:  *FEVER GREATER THAN 101.0 F  *CHILLS WITH OR WITHOUT FEVER  NAUSEA AND VOMITING THAT IS NOT CONTROLLED WITH YOUR NAUSEA MEDICATION  *UNUSUAL SHORTNESS OF BREATH  *UNUSUAL BRUISING OR BLEEDING  TENDERNESS IN MOUTH AND THROAT WITH OR WITHOUT PRESENCE OF ULCERS  *URINARY PROBLEMS  *BOWEL PROBLEMS  UNUSUAL RASH Items with * indicate a potential emergency and should be followed up as soon as possible. If you have an emergency after office hours please contact your primary care physician or go to the nearest emergency department.  Please call the clinic during office hours if you have any questions or concerns.   You may also contact the Patient Navigator at [phone] should you have any questions or need assistance in obtaining follow up care.      Resources For Cancer Patients and their Caregivers ? American Cancer Society: Can assist with transportation, wigs, general needs, runs Look Good Feel Better.        1-888-227-6333 ? Cancer Care: Provides financial assistance, online support groups, medication/co-pay assistance.  1-800-813-HOPE (4673) ? [facility] Assists [place] cancer patients and their families through emotional , educational and financial support.  336-427-4357 ? [place] DSS Where to apply for food stamps, Medicaid and utility assistance. 336-342-1394 ? [employer]: Transportation to medical appointments. 336-347-2287 ? Social Security Administration: May apply for disability if have a Stage IV cancer. 336-342-7796 1-800-772-1213 ? [place] [facility]ging, Disability and Transit Services: Assists with nutrition, care and transit needs. [phone]         

## 2020-05-13 ENCOUNTER — Inpatient Hospital Stay (HOSPITAL_COMMUNITY): Payer: 59

## 2020-05-13 ENCOUNTER — Encounter (HOSPITAL_COMMUNITY): Payer: Self-pay

## 2020-05-13 VITALS — BP 122/79 | HR 92 | Temp 97.9°F | Resp 18

## 2020-05-13 DIAGNOSIS — D649 Anemia, unspecified: Secondary | ICD-10-CM

## 2020-05-13 DIAGNOSIS — D46Z Other myelodysplastic syndromes: Secondary | ICD-10-CM

## 2020-05-13 DIAGNOSIS — Z95828 Presence of other vascular implants and grafts: Secondary | ICD-10-CM

## 2020-05-13 DIAGNOSIS — C9310 Chronic myelomonocytic leukemia not having achieved remission: Secondary | ICD-10-CM | POA: Diagnosis not present

## 2020-05-13 MED ORDER — HEPARIN SOD (PORK) LOCK FLUSH 100 UNIT/ML IV SOLN
500.0000 [IU] | Freq: Every day | INTRAVENOUS | Status: AC | PRN
  Administered 2020-05-13: 500 [IU]

## 2020-05-13 MED ORDER — PALONOSETRON HCL INJECTION 0.25 MG/5ML: 0.2500 mg | Freq: Once | INTRAVENOUS | Status: DC

## 2020-05-13 MED ORDER — SODIUM CHLORIDE 0.9 % IV SOLN: Freq: Once | INTRAVENOUS | Status: AC

## 2020-05-13 MED ORDER — SODIUM CHLORIDE 0.9% FLUSH
10.0000 mL | INTRAVENOUS | Status: AC | PRN
  Administered 2020-05-13: 10 mL

## 2020-05-13 MED ORDER — SODIUM CHLORIDE 0.9 % IV SOLN
100.0000 mg/m2 | Freq: Once | INTRAVENOUS | Status: AC
  Administered 2020-05-13: 225 mg via INTRAVENOUS
  Filled 2020-05-13: qty 22.5

## 2020-05-13 MED ORDER — SODIUM CHLORIDE 0.9 % IV SOLN
10.0000 mg | Freq: Once | INTRAVENOUS | Status: AC
  Administered 2020-05-13: 10 mg via INTRAVENOUS
  Filled 2020-05-13: qty 10

## 2020-05-13 MED ORDER — SODIUM CHLORIDE 0.9% FLUSH
10.0000 mL | INTRAVENOUS | Status: DC | PRN
  Administered 2020-05-13: 10 mL via INTRAVENOUS

## 2020-05-13 MED ORDER — ACETAMINOPHEN 325 MG PO TABS: 650.0000 mg | ORAL_TABLET | Freq: Once | ORAL | Status: DC

## 2020-05-13 MED ORDER — DIPHENHYDRAMINE HCL 25 MG PO CAPS: 25.0000 mg | ORAL_CAPSULE | Freq: Once | ORAL | Status: DC

## 2020-05-13 MED ORDER — SODIUM CHLORIDE 0.9% IV SOLUTION
250.0000 mL | Freq: Once | INTRAVENOUS | Status: AC
  Administered 2020-05-13: 250 mL via INTRAVENOUS

## 2020-05-13 NOTE — Progress Notes (Signed)
Proceed with elevated HR.  Getting blood today.  Dr Mariann Laster, RN/September Mormile Yetta Barre, PharmD

## 2020-05-13 NOTE — Progress Notes (Signed)
Hunter Smith tolerated Vidaza infusion and blood transfusions well without complaints or incident. VSS prior to,during and after each transfusion. Pt discharged self ambulatory in satisfactory condition

## 2020-05-13 NOTE — Patient Instructions (Signed)
[facility] Discharge Instructions for Patients Receiving Chemotherapy   Beginning January 23rd 2017 lab work for the [facility] will be done in the  Main lab at [facility] on 1st floor. If you have a lab appointment with the Cancer Center please come in thru the  Main Entrance and check in at the main information desk   Today you received the following chemotherapy agents Vidaza as well as blood transfusion  To help prevent nausea and vomiting after your treatment, we encourage you to take your nausea medication   If you develop nausea and vomiting, or diarrhea that is not controlled by your medication, call the clinic.  The clinic phone number is [phone]. Office hours are Monday-Friday 8:30am-5:00pm.  BELOW ARE SYMPTOMS THAT SHOULD BE REPORTED IMMEDIATELY:  *FEVER GREATER THAN 101.0 F  *CHILLS WITH OR WITHOUT FEVER  NAUSEA AND VOMITING THAT IS NOT CONTROLLED WITH YOUR NAUSEA MEDICATION  *UNUSUAL SHORTNESS OF BREATH  *UNUSUAL BRUISING OR BLEEDING  TENDERNESS IN MOUTH AND THROAT WITH OR WITHOUT PRESENCE OF ULCERS  *URINARY PROBLEMS  *BOWEL PROBLEMS  UNUSUAL RASH Items with * indicate a potential emergency and should be followed up as soon as possible. If you have an emergency after office hours please contact your primary care physician or go to the nearest emergency department.  Please call the clinic during office hours if you have any questions or concerns.   You may also contact the Patient Navigator at [phone] should you have any questions or need assistance in obtaining follow up care.      Resources For Cancer Patients and their Caregivers ? American Cancer Society: Can assist with transportation, wigs, general needs, runs Look Good Feel Better.        5146154409 ? Cancer Care: Provides financial assistance, online support groups, medication/co-pay assistance.  1-800-813-HOPE (437)035-9575) ? [facility] Assists [place] cancer patients and their families through emotional , educational and financial support.  616-530-6024 ? [place] DSS Where to apply for food stamps, Medicaid and utility assistance. 646-210-2758 ? [employer]: Transportation to medical appointments. (709)221-1276 ? Social Security Administration: May apply for disability if have a Stage IV cancer. [phone] [phone] ? [employer], Disability and Transit Services: Assists with nutrition, care and transit needs. ([phone]

## 2020-05-14 ENCOUNTER — Inpatient Hospital Stay (HOSPITAL_COMMUNITY): Payer: 59

## 2020-05-14 ENCOUNTER — Other Ambulatory Visit: Payer: Self-pay

## 2020-05-14 ENCOUNTER — Encounter (HOSPITAL_COMMUNITY): Payer: Self-pay

## 2020-05-14 VITALS — BP 126/81 | HR 74 | Temp 96.8°F | Resp 18

## 2020-05-14 DIAGNOSIS — D46Z Other myelodysplastic syndromes: Secondary | ICD-10-CM

## 2020-05-14 DIAGNOSIS — C9310 Chronic myelomonocytic leukemia not having achieved remission: Secondary | ICD-10-CM | POA: Diagnosis not present

## 2020-05-14 DIAGNOSIS — Z95828 Presence of other vascular implants and grafts: Secondary | ICD-10-CM

## 2020-05-14 LAB — TYPE AND SCREEN
ABO/RH(D): O POS
Antibody Screen: NEGATIVE
Unit division: 0
Unit division: 0

## 2020-05-14 LAB — BPAM RBC
Blood Product Expiration Date: 202201262359
Blood Product Expiration Date: 202201262359
ISSUE DATE / TIME: 202201041247
ISSUE DATE / TIME: 202201041424
Unit Type and Rh: 5100
Unit Type and Rh: 9500

## 2020-05-14 MED ORDER — SODIUM CHLORIDE 0.9 % IV SOLN: Freq: Once | INTRAVENOUS | Status: AC

## 2020-05-14 MED ORDER — SODIUM CHLORIDE 0.9 % IV SOLN
100.0000 mg/m2 | Freq: Once | INTRAVENOUS | Status: AC
  Administered 2020-05-14: 225 mg via INTRAVENOUS
  Filled 2020-05-14: qty 22.5

## 2020-05-14 MED ORDER — SODIUM CHLORIDE 0.9% FLUSH
10.0000 mL | INTRAVENOUS | Status: DC | PRN
  Administered 2020-05-14 (×2): 10 mL

## 2020-05-14 MED ORDER — HEPARIN SOD (PORK) LOCK FLUSH 100 UNIT/ML IV SOLN
500.0000 [IU] | Freq: Once | INTRAVENOUS | Status: AC | PRN
  Administered 2020-05-14: 500 [IU]

## 2020-05-14 MED ORDER — SODIUM CHLORIDE 0.9 % IV SOLN
10.0000 mg | Freq: Once | INTRAVENOUS | Status: AC
  Administered 2020-05-14: 10 mg via INTRAVENOUS
  Filled 2020-05-14: qty 10

## 2020-05-14 NOTE — Progress Notes (Signed)
Hunter Smith tolerated Vidaza infusion well without complaints or incident. VSS upon discharge. Pt discharged self ambulatory in satisfactory condition 

## 2020-05-14 NOTE — Patient Instructions (Signed)
[facility] Discharge Instructions for Patients Receiving Chemotherapy   Beginning January 23rd 2017 lab work for the Cancer Center will be done in the  Main lab at [place] on 1st floor. If you have a lab appointment with the Cancer Center please come in thru the  Main Entrance and check in at the main information desk   Today you received the following chemotherapy agents Vidaza. Follow-up as scheduled  To help prevent nausea and vomiting after your treatment, we encourage you to take your nausea medication   If you develop nausea and vomiting, or diarrhea that is not controlled by your medication, call the clinic.  The clinic phone number is [phone]. Office hours are Monday-Friday 8:30am-5:00pm.  BELOW ARE SYMPTOMS THAT SHOULD BE REPORTED IMMEDIATELY:  *FEVER GREATER THAN 101.0 F  *CHILLS WITH OR WITHOUT FEVER  NAUSEA AND VOMITING THAT IS NOT CONTROLLED WITH YOUR NAUSEA MEDICATION  *UNUSUAL SHORTNESS OF BREATH  *UNUSUAL BRUISING OR BLEEDING  TENDERNESS IN MOUTH AND THROAT WITH OR WITHOUT PRESENCE OF ULCERS  *URINARY PROBLEMS  *BOWEL PROBLEMS  UNUSUAL RASH Items with * indicate a potential emergency and should be followed up as soon as possible. If you have an emergency after office hours please contact your primary care physician or go to the nearest emergency department.  Please call the clinic during office hours if you have any questions or concerns.   You may also contact the Patient Navigator at [phone] should you have any questions or need assistance in obtaining follow up care.      Resources For Cancer Patients and their Caregivers ? American Cancer Society: Can assist with transportation, wigs, general needs, runs Look Good Feel Better.        1-888-227-6333 ? Cancer Care: Provides financial assistance, online support groups, medication/co-pay assistance.  1-800-813-HOPE (4673) ? [facility] Assists [place] cancer patients and their families through emotional , educational and financial support.  336-427-4357 ? [place] DSS Where to apply for food stamps, Medicaid and utility assistance. 336-342-1394 ? [employer]: Transportation to medical appointments. 336-347-2287 ? Social Security Administration: May apply for disability if have a Stage IV cancer. 336-342-7796 1-800-772-1213 ? [place] [facility]ging, Disability and Transit Services: Assists with nutrition, care and transit needs. [phone]         

## 2020-05-19 ENCOUNTER — Other Ambulatory Visit: Payer: Self-pay

## 2020-05-19 ENCOUNTER — Other Ambulatory Visit (HOSPITAL_COMMUNITY): Payer: Self-pay | Admitting: *Deleted

## 2020-05-19 ENCOUNTER — Encounter (HOSPITAL_COMMUNITY): Payer: Self-pay | Admitting: *Deleted

## 2020-05-19 ENCOUNTER — Encounter (HOSPITAL_COMMUNITY): Payer: Self-pay

## 2020-05-19 ENCOUNTER — Inpatient Hospital Stay (HOSPITAL_COMMUNITY): Payer: 59

## 2020-05-19 DIAGNOSIS — D46Z Other myelodysplastic syndromes: Secondary | ICD-10-CM

## 2020-05-19 DIAGNOSIS — C9310 Chronic myelomonocytic leukemia not having achieved remission: Secondary | ICD-10-CM | POA: Diagnosis not present

## 2020-05-19 LAB — PREPARE RBC (CROSSMATCH)

## 2020-05-19 LAB — SAMPLE TO BLOOD BANK

## 2020-05-19 MED ORDER — HEPARIN SOD (PORK) LOCK FLUSH 100 UNIT/ML IV SOLN
500.0000 [IU] | Freq: Once | INTRAVENOUS | Status: AC
  Administered 2020-05-19: 500 [IU] via INTRAVENOUS

## 2020-05-19 MED ORDER — SODIUM CHLORIDE 0.9% FLUSH
20.0000 mL | INTRAVENOUS | Status: DC | PRN
  Administered 2020-05-19: 20 mL via INTRAVENOUS

## 2020-05-19 NOTE — Patient Instructions (Signed)
[facility] at [facility] Discharge Instructions  Labs drawn from portacath today   Thank you for choosing [facility] at [facility] to provide your oncology and hematology care.  To afford each patient quality time with our provider, please arrive at least 15 minutes before your scheduled appointment time.   If you have a lab appointment with the Cancer Center please come in thru the Main Entrance and check in at the main information desk.  You need to re-schedule your appointment should you arrive 10 or more minutes late.  We strive to give you quality time with our providers, and arriving late affects you and other patients whose appointments are after yours.  Also, if you no show three or more times for appointments you may be dismissed from the clinic at the providers discretion.     Again, thank you for choosing [facility].  Our hope is that these requests will decrease the amount of time that you wait before being seen by our physicians.       _____________________________________________________________  Should you have questions after your visit to [facility], please contact our office at [phone] and follow the prompts.  Our office hours are 8:00 a.m. and 4:30 p.m. Monday - Friday.  Please note that voicemails left after 4:00 p.m. may not be returned until the following business day.  We are closed weekends and major holidays.  You do have access to a nurse 24-7, just call the main number to the clinic [phone] and do not press any options, hold on the line and a nurse will answer the phone.    For prescription refill requests, have your pharmacy contact our office and allow 72 hours.    Due to Covid, you will need to wear a mask upon entering the hospital. If you do not have a mask, a mask will be given to you at the Main Entrance upon arrival. For doctor visits, patients may have 1 support person age 18  or older with them. For treatment visits, patients can not have anyone with them due to social distancing guidelines and our immunocompromised population.     

## 2020-05-19 NOTE — Progress Notes (Signed)
Orders placed for 1 unit PRBC to be transfused tomorrow. Patient and blood bank aware.

## 2020-05-19 NOTE — Progress Notes (Signed)
Critical value alert:   Platelet count 15k  Dr. Delton Coombes aware, no orders at this time.    Patient's hgb is 7.5.  Orders received for 1 unit PRBC for tomorrow.    Patient scheduled and blood bank aware.

## 2020-05-20 ENCOUNTER — Encounter (HOSPITAL_COMMUNITY): Payer: Self-pay

## 2020-05-20 ENCOUNTER — Inpatient Hospital Stay (HOSPITAL_COMMUNITY): Payer: 59

## 2020-05-20 ENCOUNTER — Encounter (HOSPITAL_COMMUNITY): Payer: Self-pay | Admitting: *Deleted

## 2020-05-20 ENCOUNTER — Other Ambulatory Visit (HOSPITAL_COMMUNITY): Payer: Self-pay | Admitting: *Deleted

## 2020-05-20 DIAGNOSIS — D46Z Other myelodysplastic syndromes: Secondary | ICD-10-CM

## 2020-05-20 DIAGNOSIS — C9310 Chronic myelomonocytic leukemia not having achieved remission: Secondary | ICD-10-CM | POA: Diagnosis not present

## 2020-05-20 LAB — CBC WITH DIFFERENTIAL/PLATELET
Abs Immature Granulocytes: 0.02 10*3/uL (ref 0.00–0.07)
Basophils Absolute: 0 10*3/uL (ref 0.0–0.1)
Basophils Relative: 1 %
Eosinophils Absolute: 0 10*3/uL (ref 0.0–0.5)
Eosinophils Relative: 1 %
HCT: 23.3 % — ABNORMAL LOW (ref 39.0–52.0)
Hemoglobin: 7.5 g/dL — ABNORMAL LOW (ref 13.0–17.0)
Immature Granulocytes: 1 %
Lymphocytes Relative: 35 %
Lymphs Abs: 0.7 10*3/uL (ref 0.7–4.0)
MCH: 28.7 pg (ref 26.0–34.0)
MCHC: 32.2 g/dL (ref 30.0–36.0)
MCV: 89.3 fL (ref 80.0–100.0)
Monocytes Absolute: 1 10*3/uL (ref 0.1–1.0)
Monocytes Relative: 46 %
Neutro Abs: 0.3 10*3/uL — CL (ref 1.7–7.7)
Neutrophils Relative %: 16 %
Platelets: 15 10*3/uL — CL (ref 150–400)
RBC: 2.61 MIL/uL — ABNORMAL LOW (ref 4.22–5.81)
RDW: 14.1 % (ref 11.5–15.5)
WBC: 2.1 10*3/uL — ABNORMAL LOW (ref 4.0–10.5)
nRBC: 0 % (ref 0.0–0.2)

## 2020-05-20 MED ORDER — SODIUM CHLORIDE 0.9 % IV SOLN: INTRAVENOUS | Status: DC

## 2020-05-20 MED ORDER — ACETAMINOPHEN 325 MG PO TABS: 650.0000 mg | ORAL_TABLET | Freq: Once | ORAL | Status: DC

## 2020-05-20 MED ORDER — SODIUM CHLORIDE 0.9% FLUSH
10.0000 mL | INTRAVENOUS | Status: AC | PRN
  Administered 2020-05-20: 10 mL

## 2020-05-20 MED ORDER — HEPARIN SOD (PORK) LOCK FLUSH 100 UNIT/ML IV SOLN
500.0000 [IU] | Freq: Every day | INTRAVENOUS | Status: AC | PRN
  Administered 2020-05-20: 500 [IU]

## 2020-05-20 MED ORDER — DIPHENHYDRAMINE HCL 25 MG PO CAPS: 25.0000 mg | ORAL_CAPSULE | Freq: Once | ORAL | Status: DC

## 2020-05-20 MED ORDER — SODIUM CHLORIDE 0.9% IV SOLUTION
250.0000 mL | Freq: Once | INTRAVENOUS | Status: AC
  Administered 2020-05-20: 250 mL via INTRAVENOUS

## 2020-05-20 NOTE — Progress Notes (Signed)
Critical value alert:  Notified today by lab that critical wasn't called yesterday.   ANC 0.3   Dr. Delton Coombes aware. No orders at this time.

## 2020-05-20 NOTE — Progress Notes (Signed)
Patient tolerated blood transfusion with no complaints voiced.  Patients port flushed without difficulty.  Good blood return noted with no bruising or swelling noted at site.  Band aid applied.  VSS with discharge and left in satisfactory condition with no s/s of distress noted.  

## 2020-05-21 LAB — TYPE AND SCREEN
ABO/RH(D): O POS
Antibody Screen: NEGATIVE
Unit division: 0

## 2020-05-21 LAB — BPAM RBC
Blood Product Expiration Date: 202201312359
ISSUE DATE / TIME: 202201110827
Unit Type and Rh: 5100

## 2020-05-22 ENCOUNTER — Encounter (HOSPITAL_COMMUNITY): Payer: Self-pay

## 2020-05-22 ENCOUNTER — Inpatient Hospital Stay (HOSPITAL_COMMUNITY): Payer: 59

## 2020-05-22 ENCOUNTER — Encounter (HOSPITAL_COMMUNITY): Payer: Self-pay | Admitting: *Deleted

## 2020-05-22 ENCOUNTER — Other Ambulatory Visit (HOSPITAL_COMMUNITY): Payer: Self-pay | Admitting: *Deleted

## 2020-05-22 ENCOUNTER — Other Ambulatory Visit: Payer: Self-pay

## 2020-05-22 DIAGNOSIS — D46Z Other myelodysplastic syndromes: Secondary | ICD-10-CM

## 2020-05-22 DIAGNOSIS — C9310 Chronic myelomonocytic leukemia not having achieved remission: Secondary | ICD-10-CM | POA: Diagnosis not present

## 2020-05-22 LAB — CBC WITH DIFFERENTIAL/PLATELET
Basophils Absolute: 0 10*3/uL (ref 0.0–0.1)
Basophils Relative: 0 %
Eosinophils Absolute: 0.1 10*3/uL (ref 0.0–0.5)
Eosinophils Relative: 4 %
HCT: 24.2 % — ABNORMAL LOW (ref 39.0–52.0)
Hemoglobin: 7.8 g/dL — ABNORMAL LOW (ref 13.0–17.0)
Lymphocytes Relative: 52 %
Lymphs Abs: 1.1 10*3/uL (ref 0.7–4.0)
MCH: 28.6 pg (ref 26.0–34.0)
MCHC: 32.2 g/dL (ref 30.0–36.0)
MCV: 88.6 fL (ref 80.0–100.0)
Monocytes Absolute: 0.8 10*3/uL (ref 0.1–1.0)
Monocytes Relative: 36 %
Neutro Abs: 0.2 10*3/uL — CL (ref 1.7–7.7)
Neutrophils Relative %: 8 %
Platelets: 10 10*3/uL — CL (ref 150–400)
RBC: 2.73 MIL/uL — ABNORMAL LOW (ref 4.22–5.81)
RDW: 14.1 % (ref 11.5–15.5)
WBC: 2.2 10*3/uL — ABNORMAL LOW (ref 4.0–10.5)
nRBC: 0 % (ref 0.0–0.2)

## 2020-05-22 LAB — SAMPLE TO BLOOD BANK

## 2020-05-22 MED ORDER — HEPARIN SOD (PORK) LOCK FLUSH 100 UNIT/ML IV SOLN
500.0000 [IU] | Freq: Once | INTRAVENOUS | Status: AC
  Administered 2020-05-22: 500 [IU] via INTRAVENOUS

## 2020-05-22 MED ORDER — SODIUM CHLORIDE 0.9% FLUSH
20.0000 mL | INTRAVENOUS | Status: DC | PRN
  Administered 2020-05-22: 20 mL via INTRAVENOUS

## 2020-05-22 NOTE — Patient Instructions (Signed)
[facility] Discharge Instructions  Labs drawn from portacath today   Thank you for choosing [facility] at [facility] to provide your oncology and hematology care.  To afford each patient quality time with our provider, please arrive at least 15 minutes before your scheduled appointment time.   If you have a lab appointment with the Cancer Center please come in thru the Main Entrance and check in at the main information desk.  You need to re-schedule your appointment should you arrive 10 or more minutes late.  We strive to give you quality time with our providers, and arriving late affects you and other patients whose appointments are after yours.  Also, if you no show three or more times for appointments you may be dismissed from the clinic at the providers discretion.     Again, thank you for choosing [facility].  Our hope is that these requests will decrease the amount of time that you wait before being seen by our physicians.       _____________________________________________________________  Should you have questions after your visit to  Cancer Center, please contact our office at [phone] and follow the prompts.  Our office hours are 8:00 a.m. and 4:30 p.m. Monday - Friday.  Please note that voicemails left after 4:00 p.m. may not be returned until the following business day.  We are closed weekends and major holidays.  You do have access to a nurse 24-7, just call the main number to the clinic [phone] and do not press any options, hold on the line and a nurse will answer the phone.    For prescription refill requests, have your pharmacy contact our office and allow 72 hours.    Due to Covid, you will need to wear a mask upon entering the hospital. If you do not have a mask, a mask will be given to you at the Main Entrance upon arrival. For doctor visits, patients may have 1 support person age 18  or older with them. For treatment visits, patients can not have anyone with them due to social distancing guidelines and our immunocompromised population.     

## 2020-05-22 NOTE — Progress Notes (Signed)
Author Noren tolerated portacath lab draw well without complaints or incident. VSS Pt discharged self ambulatory in satisfactory condition

## 2020-05-22 NOTE — Progress Notes (Signed)
Critical value alert:    Platelet count 10k   Dr. Delton Coombes aware, orders received for 1 unit platetes, Irradiated.    Patient aware of appointment date and time.

## 2020-05-22 NOTE — Progress Notes (Signed)
Critical value alert:   ANC 0.2   Dr. Delton Coombes aware, no orders needed at this time.

## 2020-05-23 ENCOUNTER — Inpatient Hospital Stay (HOSPITAL_COMMUNITY): Payer: 59

## 2020-05-23 DIAGNOSIS — C9310 Chronic myelomonocytic leukemia not having achieved remission: Secondary | ICD-10-CM | POA: Diagnosis not present

## 2020-05-23 DIAGNOSIS — D46Z Other myelodysplastic syndromes: Secondary | ICD-10-CM

## 2020-05-23 MED ORDER — SODIUM CHLORIDE 0.9% IV SOLUTION
250.0000 mL | Freq: Once | INTRAVENOUS | Status: AC
  Administered 2020-05-23: 250 mL via INTRAVENOUS

## 2020-05-23 MED ORDER — SODIUM CHLORIDE 0.9% FLUSH: 10.0000 mL | INTRAVENOUS | Status: DC | PRN

## 2020-05-23 MED ORDER — HEPARIN SOD (PORK) LOCK FLUSH 100 UNIT/ML IV SOLN
500.0000 [IU] | Freq: Every day | INTRAVENOUS | Status: AC | PRN
  Administered 2020-05-23: 500 [IU]

## 2020-05-23 NOTE — Progress Notes (Signed)
Patient presents today for Platelet infusion.  Vital signs stable.  Patient has no new complaints since last visit.  Platelet infusion given today per MD orders.  Tolerated infusion without adverse affects.  Vital signs stable.  No complaints at this time.  Discharge from clinic ambulatory in stable condition.  Alert and oriented X 3.  Follow up with [facility] as scheduled.

## 2020-05-23 NOTE — Patient Instructions (Signed)
[facility] Discharge Instructions for Patients Receiving Chemotherapy  Today you received the following chemotherapy agents   To help prevent nausea and vomiting after your treatment, we encourage you to take your nausea medication   If you develop nausea and vomiting that is not controlled by your nausea medication, call the clinic.   BELOW ARE SYMPTOMS THAT SHOULD BE REPORTED IMMEDIATELY:  *FEVER GREATER THAN 100.5 F  *CHILLS WITH OR WITHOUT FEVER  NAUSEA AND VOMITING THAT IS NOT CONTROLLED WITH YOUR NAUSEA MEDICATION  *UNUSUAL SHORTNESS OF BREATH  *UNUSUAL BRUISING OR BLEEDING  TENDERNESS IN MOUTH AND THROAT WITH OR WITHOUT PRESENCE OF ULCERS  *URINARY PROBLEMS  *BOWEL PROBLEMS  UNUSUAL RASH Items with * indicate a potential emergency and should be followed up as soon as possible.  Feel free to call the clinic should you have any questions or concerns. The clinic phone number is [phone].  Please show the CHEMO ALERT CARD at check-in to the Emergency Department and triage nurse.   

## 2020-05-24 LAB — PREPARE PLATELET PHERESIS: Unit division: 0

## 2020-05-24 LAB — BPAM PLATELET PHERESIS
Blood Product Expiration Date: 202201152359
ISSUE DATE / TIME: 202201140953
Unit Type and Rh: 5100

## 2020-05-26 ENCOUNTER — Other Ambulatory Visit (HOSPITAL_COMMUNITY): Payer: 59

## 2020-05-29 ENCOUNTER — Other Ambulatory Visit: Payer: Self-pay

## 2020-05-29 ENCOUNTER — Encounter (HOSPITAL_COMMUNITY): Payer: Self-pay

## 2020-05-29 ENCOUNTER — Inpatient Hospital Stay (HOSPITAL_COMMUNITY): Payer: 59

## 2020-05-29 DIAGNOSIS — C9310 Chronic myelomonocytic leukemia not having achieved remission: Secondary | ICD-10-CM | POA: Diagnosis not present

## 2020-05-29 DIAGNOSIS — D46Z Other myelodysplastic syndromes: Secondary | ICD-10-CM

## 2020-05-29 LAB — CBC WITH DIFFERENTIAL/PLATELET
Basophils Absolute: 0 10*3/uL (ref 0.0–0.1)
Basophils Relative: 1 %
Eosinophils Absolute: 0.3 10*3/uL (ref 0.0–0.5)
Eosinophils Relative: 7 %
HCT: 26.4 % — ABNORMAL LOW (ref 39.0–52.0)
Hemoglobin: 8.4 g/dL — ABNORMAL LOW (ref 13.0–17.0)
Lymphocytes Relative: 42 %
Lymphs Abs: 1.6 10*3/uL (ref 0.7–4.0)
MCH: 27.7 pg (ref 26.0–34.0)
MCHC: 31.8 g/dL (ref 30.0–36.0)
MCV: 87.1 fL (ref 80.0–100.0)
Metamyelocytes Relative: 1 %
Monocytes Absolute: 1.2 10*3/uL — ABNORMAL HIGH (ref 0.1–1.0)
Monocytes Relative: 30 %
Myelocytes: 2 %
Neutro Abs: 0.7 10*3/uL — ABNORMAL LOW (ref 1.7–7.7)
Neutrophils Relative %: 17 %
Platelets: 34 10*3/uL — ABNORMAL LOW (ref 150–400)
RBC: 3.03 MIL/uL — ABNORMAL LOW (ref 4.22–5.81)
RDW: 14.5 % (ref 11.5–15.5)
WBC: 3.9 10*3/uL — ABNORMAL LOW (ref 4.0–10.5)
nRBC: 0 % (ref 0.0–0.2)

## 2020-05-29 LAB — SAMPLE TO BLOOD BANK

## 2020-05-29 MED ORDER — HEPARIN SOD (PORK) LOCK FLUSH 100 UNIT/ML IV SOLN
500.0000 [IU] | Freq: Once | INTRAVENOUS | Status: AC
  Administered 2020-05-29: 500 [IU] via INTRAVENOUS

## 2020-05-29 MED ORDER — SODIUM CHLORIDE 0.9% FLUSH
20.0000 mL | INTRAVENOUS | Status: DC | PRN
  Administered 2020-05-29: 20 mL via INTRAVENOUS

## 2020-05-29 NOTE — Patient Instructions (Signed)
[facility] at [facility] Discharge Instructions  Labs drawn from portacath today   Thank you for choosing [facility] at [facility] to provide your oncology and hematology care.  To afford each patient quality time with our provider, please arrive at least 15 minutes before your scheduled appointment time.   If you have a lab appointment with the Cancer Center please come in thru the Main Entrance and check in at the main information desk.  You need to re-schedule your appointment should you arrive 10 or more minutes late.  We strive to give you quality time with our providers, and arriving late affects you and other patients whose appointments are after yours.  Also, if you no show three or more times for appointments you may be dismissed from the clinic at the providers discretion.     Again, thank you for choosing [facility].  Our hope is that these requests will decrease the amount of time that you wait before being seen by our physicians.       _____________________________________________________________  Should you have questions after your visit to [facility], please contact our office at [phone] and follow the prompts.  Our office hours are 8:00 a.m. and 4:30 p.m. Monday - Friday.  Please note that voicemails left after 4:00 p.m. may not be returned until the following business day.  We are closed weekends and major holidays.  You do have access to a nurse 24-7, just call the main number to the clinic [phone] and do not press any options, hold on the line and a nurse will answer the phone.    For prescription refill requests, have your pharmacy contact our office and allow 72 hours.    Due to Covid, you will need to wear a mask upon entering the hospital. If you do not have a mask, a mask will be given to you at the Main Entrance upon arrival. For doctor visits, patients may have 1 support person age 18  or older with them. For treatment visits, patients can not have anyone with them due to social distancing guidelines and our immunocompromised population.     

## 2020-05-29 NOTE — Progress Notes (Signed)
Hunter Smith tolerated port lab draw well without complaints or incident. VSS Pt discharged self ambulatory in satisfactory condition 

## 2020-06-02 ENCOUNTER — Other Ambulatory Visit: Payer: Self-pay

## 2020-06-02 ENCOUNTER — Inpatient Hospital Stay (HOSPITAL_COMMUNITY): Payer: 59

## 2020-06-02 ENCOUNTER — Inpatient Hospital Stay (HOSPITAL_BASED_OUTPATIENT_CLINIC_OR_DEPARTMENT_OTHER): Payer: 59 | Admitting: Hematology

## 2020-06-02 VITALS — BP 134/89 | HR 95 | Temp 97.3°F | Resp 18 | Wt 235.2 lb

## 2020-06-02 VITALS — BP 106/60 | HR 81 | Temp 96.8°F | Resp 18

## 2020-06-02 DIAGNOSIS — D46Z Other myelodysplastic syndromes: Secondary | ICD-10-CM | POA: Diagnosis not present

## 2020-06-02 DIAGNOSIS — Z95828 Presence of other vascular implants and grafts: Secondary | ICD-10-CM

## 2020-06-02 DIAGNOSIS — C9310 Chronic myelomonocytic leukemia not having achieved remission: Secondary | ICD-10-CM | POA: Diagnosis not present

## 2020-06-02 LAB — COMPREHENSIVE METABOLIC PANEL
ALT: 129 U/L — ABNORMAL HIGH (ref 0–44)
AST: 47 U/L — ABNORMAL HIGH (ref 15–41)
Albumin: 3.6 g/dL (ref 3.5–5.0)
Alkaline Phosphatase: 46 U/L (ref 38–126)
Anion gap: 7 (ref 5–15)
BUN: 20 mg/dL (ref 6–20)
CO2: 26 mmol/L (ref 22–32)
Calcium: 8.9 mg/dL (ref 8.9–10.3)
Chloride: 104 mmol/L (ref 98–111)
Creatinine, Ser: 0.91 mg/dL (ref 0.61–1.24)
GFR, Estimated: >60 mL/min (ref >60)
Glucose, Bld: 118 mg/dL — ABNORMAL HIGH (ref 70–99)
Potassium: 4.3 mmol/L (ref 3.5–5.1)
Sodium: 137 mmol/L (ref 135–145)
Total Bilirubin: 0.5 mg/dL (ref 0.3–1.2)
Total Protein: 6.4 g/dL — ABNORMAL LOW (ref 6.5–8.1)

## 2020-06-02 LAB — CBC WITH DIFFERENTIAL/PLATELET
Abs Immature Granulocytes: 0.1 10*3/uL — ABNORMAL HIGH (ref 0.00–0.07)
Basophils Absolute: 0.1 10*3/uL (ref 0.0–0.1)
Basophils Relative: 1 %
Eosinophils Absolute: 0 10*3/uL (ref 0.0–0.5)
Eosinophils Relative: 0 %
HCT: 24.3 % — ABNORMAL LOW (ref 39.0–52.0)
Hemoglobin: 7.8 g/dL — ABNORMAL LOW (ref 13.0–17.0)
Immature Granulocytes: 3 %
Lymphocytes Relative: 28 %
Lymphs Abs: 1.1 10*3/uL (ref 0.7–4.0)
MCH: 28.2 pg (ref 26.0–34.0)
MCHC: 32.1 g/dL (ref 30.0–36.0)
MCV: 87.7 fL (ref 80.0–100.0)
Monocytes Absolute: 1.9 10*3/uL — ABNORMAL HIGH (ref 0.1–1.0)
Monocytes Relative: 49 %
Neutro Abs: 0.7 10*3/uL — ABNORMAL LOW (ref 1.7–7.7)
Neutrophils Relative %: 19 %
Platelets: 44 10*3/uL — ABNORMAL LOW (ref 150–400)
RBC: 2.77 MIL/uL — ABNORMAL LOW (ref 4.22–5.81)
RDW: 14.2 % (ref 11.5–15.5)
WBC: 3.8 10*3/uL — ABNORMAL LOW (ref 4.0–10.5)
nRBC: 0 % (ref 0.0–0.2)

## 2020-06-02 LAB — SAMPLE TO BLOOD BANK

## 2020-06-02 LAB — LACTATE DEHYDROGENASE: LDH: 156 U/L (ref 98–192)

## 2020-06-02 MED ORDER — SODIUM CHLORIDE 0.9% FLUSH
10.0000 mL | INTRAVENOUS | Status: DC | PRN
  Administered 2020-06-02: 10 mL

## 2020-06-02 MED ORDER — SODIUM CHLORIDE 0.9 % IV SOLN
10.0000 mg | Freq: Once | INTRAVENOUS | Status: AC
  Administered 2020-06-02: 10 mg via INTRAVENOUS
  Filled 2020-06-02: qty 10

## 2020-06-02 MED ORDER — HEPARIN SOD (PORK) LOCK FLUSH 100 UNIT/ML IV SOLN
500.0000 [IU] | Freq: Once | INTRAVENOUS | Status: AC | PRN
  Administered 2020-06-02: 500 [IU]

## 2020-06-02 MED ORDER — PALONOSETRON HCL INJECTION 0.25 MG/5ML
0.2500 mg | Freq: Once | INTRAVENOUS | Status: AC
  Administered 2020-06-02: 0.25 mg via INTRAVENOUS

## 2020-06-02 MED ORDER — SODIUM CHLORIDE 0.9 % IV SOLN
100.0000 mg/m2 | Freq: Once | INTRAVENOUS | Status: AC
  Administered 2020-06-02: 225 mg via INTRAVENOUS
  Filled 2020-06-02: qty 22.5

## 2020-06-02 MED ORDER — SODIUM CHLORIDE 0.9 % IV SOLN: Freq: Once | INTRAVENOUS | Status: AC

## 2020-06-02 MED ORDER — PALONOSETRON HCL INJECTION 0.25 MG/5ML
INTRAVENOUS | Status: AC
  Filled 2020-06-02: qty 5

## 2020-06-02 NOTE — Progress Notes (Addendum)
[facility] [place], [place] [address]   CLINIC:  Medical Oncology/Hematology  PCP:  Patient, No Pcp Per None None   REASON FOR VISIT:  Follow-up for MDS, macrocytic anemia & thrombocytopenia  PRIOR THERAPY: None  NGS Results: Not done  CURRENT THERAPY: Azacitidine & Aloxi every 4 weeks  BRIEF ONCOLOGIC HISTORY:  Oncology History  Myelodysplastic syndrome, high grade (HCC)  12/28/2019 Initial Diagnosis   MDS (myelodysplastic syndrome), high grade ([place])   12/31/2019 -  Chemotherapy    Patient is on Treatment Plan: MYELODYSPLASIA  AZACITIDINE IV D1-7 Q28D        CANCER STAGING: Cancer Staging No matching staging information was found for the patient.  INTERVAL HISTORY:  Hunter Smith, a 60 y.o. male, returns for routine follow-up and consideration for next cycle of chemotherapy. Hunter Smith was last seen on 05/05/2020.  Due for cycle #6 of azacitidine and Aloxi today.   Today he is accompanied by his wife. Overall, he tells me he has been feeling okay. He had his bone marrow biopsy at [facility] and will proceed with his transplant; potential admission for the transplant will be on 3/2 and another bone marrow biopsy on 2/15. He tolerated the previous treatment well. He denies drinking alcohol or taking Tylenol recently. He denies starting any new meds. His energy levels are stable though he continues feeling fatigued walking up stairs or walking long distance. He is taking stool softener daily and eating more fiber and reports having regular daily BM's. He denies having skin rashes or leg swelling.  Overall, he feels ready for next cycle of chemo today.    REVIEW OF SYSTEMS:  Review of Systems  Constitutional: Positive for fatigue (75%). Negative for appetite change.  Cardiovascular: Negative for leg swelling.  Gastrointestinal: Negative for constipation and diarrhea.  Skin: Negative for rash.  All other systems reviewed and are  negative.   PAST MEDICAL/SURGICAL HISTORY:  Past Medical History:  Diagnosis Date  . Cancer ([place])   . Port-A-Cath in place 12/28/2019   Past Surgical History:  Procedure Laterality Date  . COLONOSCOPY    . PORTACATH PLACEMENT Right 01/18/2020   Procedure: INSERTION PORT-A-CATH;  Surgeon: Aviva Signs, MD;  Location: [facility];  Service: General;  Laterality: Right;    SOCIAL HISTORY:  Social History   Socioeconomic History  . Marital status: Married    Spouse name: Not on file  . Number of children: Not on file  . Years of education: Not on file  . Highest education level: Not on file  Occupational History  . Occupation: Employed  Tobacco Use  . Smoking status: Former Smoker    Types: Cigarettes    Quit date: 05/03/1988    Years since quitting: 32.1  . Smokeless tobacco: Never Used  Substance and Sexual Activity  . Alcohol use: Yes    Alcohol/week: 1.0 - 2.0 standard drink    Types: 1 - 2 Standard drinks or equivalent per week  . Drug use: Never  . Sexual activity: Yes  Other Topics Concern  . Not on file  Social History Narrative  . Not on file   Social Determinants of Health   Financial Resource Strain: Low Risk   . Difficulty of Paying Living Expenses: Not hard at all  Food Insecurity: No Food Insecurity  . Worried About Charity fundraiser in the Last Year: Never true  . Ran Out of Food in the Last Year: Never true  Transportation Needs:  No Transportation Needs  . Lack of Transportation (Medical): No  . Lack of Transportation (Non-Medical): No  Physical Activity: Inactive  . Days of Exercise per Week: 0 days  . Minutes of Exercise per Session: 0 min  Stress: No Stress Concern Present  . Feeling of Stress : Not at all  Social Connections: Moderately Integrated  . Frequency of Communication with Friends and Family: More than three times a week  . Frequency of Social Gatherings with Friends and Family: Three times a week  . Attends Religious Services: 1 to 4  times per year  . Active Member of Clubs or Organizations: No  . Attends Archivist Meetings: Never  . Marital Status: Married  Human resources officer Violence: Not At Risk  . Fear of Current or Ex-Partner: No  . Emotionally Abused: No  . Physically Abused: No  . Sexually Abused: No    FAMILY HISTORY:  Family History  Problem Relation Age of Onset  . Bladder Cancer Father   . Scoliosis Sister     CURRENT MEDICATIONS:  Current Outpatient Medications  Medication Sig Dispense Refill  . amoxicillin (AMOXIL) 500 MG capsule Take 1 capsule (500 mg total) by mouth 3 (three) times daily. 21 capsule 0  . azaCITIDine 5 mg/2 mLs in lactated ringers infusion Inject into the vein daily. Days 1-7 every 28 days    . docusate sodium (COLACE) 100 MG capsule Take 100 mg by mouth 2 (two) times daily.    Marland Kitchen lidocaine-prilocaine (EMLA) cream Apply a small amount to port a cath site and cover with plastic wrap 1 hour prior to chemotherapy appointments 30 g 3  . Multiple Vitamin (MULTIVITAMIN) tablet Take 1 tablet by mouth daily.    . prochlorperazine (COMPAZINE) 10 MG tablet Take 1 tablet (10 mg total) by mouth every 6 (six) hours as needed (Nausea or vomiting). 30 tablet 1   No current facility-administered medications for this visit.    ALLERGIES:  No Known Allergies  PHYSICAL EXAM:  Performance status (ECOG): 1 - Symptomatic but completely ambulatory  Vitals:   06/02/20 0815  BP: 134/89  Pulse: 95  Resp: 18  Temp: (!) 97.3 F (36.3 C)  SpO2: 99%   Wt Readings from Last 3 Encounters:  06/02/20 235 lb 3.7 oz (106.7 kg)  05/12/20 228 lb 6.4 oz (103.6 kg)  05/05/20 231 lb (104.8 kg)   Physical Exam Vitals reviewed.  Constitutional:      Appearance: Normal appearance. He is obese.  Cardiovascular:     Rate and Rhythm: Normal rate and regular rhythm.     Pulses: Normal pulses.     Heart sounds: Normal heart sounds.  Pulmonary:     Effort: Pulmonary effort is normal.     Breath  sounds: Normal breath sounds.  Abdominal:     Palpations: Abdomen is soft. There is no hepatomegaly, splenomegaly or mass.     Tenderness: There is no abdominal tenderness.     Hernia: No hernia is present.  Musculoskeletal:     Right lower leg: No edema.     Left lower leg: No edema.  Neurological:     General: No focal deficit present.     Mental Status: He is alert and oriented to person, place, and time.  Psychiatric:        Mood and Affect: Mood normal.        Behavior: Behavior normal.     LABORATORY DATA:  I have reviewed the labs as listed.  CBC Latest Ref Rng & Units 06/02/2020 05/29/2020 05/22/2020  WBC 4.0 - 10.5 K/uL 3.8(L) 3.9(L) 2.2(L)  Hemoglobin 13.0 - 17.0 g/dL 7.8(L) 8.4(L) 7.8(L)  Hematocrit 39.0 - 52.0 % 24.3(L) 26.4(L) 24.2(L)  Platelets 150 - 400 K/uL 44(L) 34(L) 10(LL)   CMP Latest Ref Rng & Units 06/02/2020 05/12/2020 05/05/2020  Glucose 70 - 99 mg/dL 118(H) 126(H) 124(H)  BUN 6 - 20 mg/dL 20 20 15   Creatinine 0.61 - 1.24 mg/dL 0.91 0.85 1.01  Sodium 135 - 145 mmol/L 137 135 134(L)  Potassium 3.5 - 5.1 mmol/L 4.3 3.8 4.2  Chloride 98 - 111 mmol/L 104 102 102  CO2 22 - 32 mmol/L 26 25 26   Calcium 8.9 - 10.3 mg/dL 8.9 8.6(L) 9.2  Total Protein 6.5 - 8.1 g/dL 6.4(L) 6.3(L) 6.9  Total Bilirubin 0.3 - 1.2 mg/dL 0.5 0.5 0.7  Alkaline Phos 38 - 126 U/L 46 40 45  AST 15 - 41 U/L 47(H) 22 29  ALT 0 - 44 U/L 129(H) 43 47(H)   Lab Results  Component Value Date   LDH 156 06/02/2020   LDH 299 (H) 05/05/2020   LDH 233 (H) 04/07/2020    DIAGNOSTIC IMAGING:  I have independently reviewed the scans and discussed with the patient. No results found.   ASSESSMENT:  1.High-grade MDS with excess blasts/CMML: -Presentation with pancytopenia and transfusion dependent anemia. -Bone marrow biopsy on 12/14/2019 at [facility] showed hypercellular marrow (90%) with dyserythropoiesis, dysgranulopoiesis and 16% blasts. Findings consistent with MDS-EB 2. FLT3  ITD/TKD negative. -Chromosome analysis 47, XY, +8[19]/46, XY[1]. -MDS FISH panel pending. -However his last 2 CBC showing increased white count of 14-15 K with peripheral blood monocytosis, also likely CMML. -He was evaluated by Dr. Jerrye Noble at [facility]. Bone marrow biopsy after cycle 2 at [facility] recommended. -Cycle 1 of azacitidine (5+2) on 12/31/2019. 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 Current biopsy shows similar hypercellular marrow with myeloid and erythroid dysplasia and blast fraction has decreased from 16% to 5-10%. -Bone marrow biopsy on 03/25/2020 shows hypercellular marrow (95% cellularity) involved by CMML, 5 to 10% blasts. Compared to bone marrow biopsy from 02/01/2020, blasts were similar. -Cycle 4 of azacitidine dose increased to 100 mg per metered squared on 04/07/2020.   PLAN:  1.High-grade MDS/CMML: -He underwent bone marrow biopsy on 05/28/2019. -Bone marrow blasts improved to less than 3%. -I have talked to Dr. Florene Glen at [facility]. Plan is to continue with 1 more cycle of azacitidine 100 mg/M square followed by a bone marrow transplant. He has another bone marrow scheduled on 06/24/2020. They have identified two unrelated donors, 10/10 match. -We will continue weekly labs and transfusion as needed. Labs were reviewed from today. AST is mildly elevated and ALT is elevated x2. Likely from fatty liver. -RTC 4 weeks for follow-up. Patient may change the appointment if he is admitted to [facility] for transplant.  2. Thrombocytopenia: -Platelet count improved to 44 today. No transfusion needed.  3. Macrocytic anemia: -Hemoglobin today 7.8. We will check his labs next week for transfusion.   Orders placed this encounter:  No orders of the defined types were placed in this encounter.    Derek Jack, MD [place] [phone]   I, Milinda Antis, am acting as a scribe for Dr. Sanda Linger.  I, Derek Jack MD, have reviewed the above documentation for accuracy and completeness, and I agree with the above.

## 2020-06-02 NOTE — Patient Instructions (Signed)
[facility] Discharge Instructions for Patients Receiving Chemotherapy  Today you received the following chemotherapy agents   To help prevent nausea and vomiting after your treatment, we encourage you to take your nausea medication   If you develop nausea and vomiting that is not controlled by your nausea medication, call the clinic.   BELOW ARE SYMPTOMS THAT SHOULD BE REPORTED IMMEDIATELY:  *FEVER GREATER THAN 100.5 F  *CHILLS WITH OR WITHOUT FEVER  NAUSEA AND VOMITING THAT IS NOT CONTROLLED WITH YOUR NAUSEA MEDICATION  *UNUSUAL SHORTNESS OF BREATH  *UNUSUAL BRUISING OR BLEEDING  TENDERNESS IN MOUTH AND THROAT WITH OR WITHOUT PRESENCE OF ULCERS  *URINARY PROBLEMS  *BOWEL PROBLEMS  UNUSUAL RASH Items with * indicate a potential emergency and should be followed up as soon as possible.  Feel free to call the clinic should you have any questions or concerns. The clinic phone number is [phone].  Please show the CHEMO ALERT CARD at check-in to the Emergency Department and triage nurse.   

## 2020-06-02 NOTE — Patient Instructions (Signed)
[place] at [facility] Discharge Instructions  You were seen today by Dr. Delton Coombes. He went over your recent results. You received your treatment today. Keep all of your appointments at [facility] for your transplant. Dr. Delton Coombes will see you back in 4 weeks for labs and follow up.   Thank you for choosing [place] at [facility] to provide your oncology and hematology care.  To afford each patient quality time with our provider, please arrive at least 15 minutes before your scheduled appointment time.   If you have a lab appointment with the [facility] please come in thru the Main Entrance and check in at the main information desk  You need to re-schedule your appointment should you arrive 10 or more minutes late.  We strive to give you quality time with our providers, and arriving late affects you and other patients whose appointments are after yours.  Also, if you no show three or more times for appointments you may be dismissed from the clinic at the providers discretion.     Again, thank you for choosing [facility].  Our hope is that these requests will decrease the amount of time that you wait before being seen by our physicians.       _____________________________________________________________  Should you have questions after your visit to [facility], please contact our office at [phone] between the hours of 8:00 a.m. and 4:30 p.m.  Voicemails left after 4:00 p.m. will not be returned until the following business day.  For prescription refill requests, have your pharmacy contact our office and allow 72 hours.    Cancer Center Support Programs:   > Cancer Support Group  2nd Tuesday of the month 1pm-2pm, Journey Room

## 2020-06-02 NOTE — Progress Notes (Signed)
Pt here for D1C6 vidaza.  Hemoglobin 7.8, platelets 44, ALT 129.  Okay for treatment today. No blood transfusion today we will monitor.   Tolerated treatment well today without incidence.  Discharged in stable condition ambulatory. Vital signs stable prior to discharge.

## 2020-06-02 NOTE — Progress Notes (Signed)
Patient assessed and labs reviewed by Dr. Delton Coombes. Okay to proceed with treatment. Dr. Raliegh Ip is aware of LFTs. Primary RN and pharmacy aware.

## 2020-06-02 NOTE — Patient Instructions (Signed)
[facility] at [facility] Discharge Instructions  Labs drawn from portacath today   Thank you for choosing [facility] at [facility] to provide your oncology and hematology care.  To afford each patient quality time with our provider, please arrive at least 15 minutes before your scheduled appointment time.   If you have a lab appointment with the Cancer Center please come in thru the Main Entrance and check in at the main information desk.  You need to re-schedule your appointment should you arrive 10 or more minutes late.  We strive to give you quality time with our providers, and arriving late affects you and other patients whose appointments are after yours.  Also, if you no show three or more times for appointments you may be dismissed from the clinic at the providers discretion.     Again, thank you for choosing [facility].  Our hope is that these requests will decrease the amount of time that you wait before being seen by our physicians.       _____________________________________________________________  Should you have questions after your visit to [facility], please contact our office at [phone] and follow the prompts.  Our office hours are 8:00 a.m. and 4:30 p.m. Monday - Friday.  Please note that voicemails left after 4:00 p.m. may not be returned until the following business day.  We are closed weekends and major holidays.  You do have access to a nurse 24-7, just call the main number to the clinic [phone] and do not press any options, hold on the line and a nurse will answer the phone.    For prescription refill requests, have your pharmacy contact our office and allow 72 hours.    Due to Covid, you will need to wear a mask upon entering the hospital. If you do not have a mask, a mask will be given to you at the Main Entrance upon arrival. For doctor visits, patients may have 1 support person age 18  or older with them. For treatment visits, patients can not have anyone with them due to social distancing guidelines and our immunocompromised population.     

## 2020-06-03 ENCOUNTER — Inpatient Hospital Stay (HOSPITAL_COMMUNITY): Payer: 59

## 2020-06-03 VITALS — BP 123/66 | HR 72 | Temp 96.8°F | Resp 18 | Wt 237.8 lb

## 2020-06-03 DIAGNOSIS — D46Z Other myelodysplastic syndromes: Secondary | ICD-10-CM

## 2020-06-03 DIAGNOSIS — Z95828 Presence of other vascular implants and grafts: Secondary | ICD-10-CM

## 2020-06-03 DIAGNOSIS — C9310 Chronic myelomonocytic leukemia not having achieved remission: Secondary | ICD-10-CM | POA: Diagnosis not present

## 2020-06-03 MED ORDER — SODIUM CHLORIDE 0.9 % IV SOLN
100.0000 mg/m2 | Freq: Once | INTRAVENOUS | Status: AC
  Administered 2020-06-03: 225 mg via INTRAVENOUS
  Filled 2020-06-03: qty 22.5

## 2020-06-03 MED ORDER — DEXAMETHASONE SODIUM PHOSPHATE 100 MG/10ML IJ SOLN
10.0000 mg | Freq: Once | INTRAMUSCULAR | Status: AC
  Administered 2020-06-03: 10 mg via INTRAVENOUS
  Filled 2020-06-03: qty 10

## 2020-06-03 MED ORDER — HEPARIN SOD (PORK) LOCK FLUSH 100 UNIT/ML IV SOLN
500.0000 [IU] | Freq: Once | INTRAVENOUS | Status: AC | PRN
  Administered 2020-06-03: 500 [IU]

## 2020-06-03 MED ORDER — SODIUM CHLORIDE 0.9% FLUSH
10.0000 mL | INTRAVENOUS | Status: DC | PRN
  Administered 2020-06-03: 10 mL

## 2020-06-03 MED ORDER — SODIUM CHLORIDE 0.9 % IV SOLN: Freq: Once | INTRAVENOUS | Status: AC

## 2020-06-03 NOTE — Progress Notes (Signed)
Pt here for day 2 of vidaza.  Vital signs stable for treatment today.   Tolerated treatment well today without incidence.  Vital signs stable prior to discharge.  Discharged ambulatory in stable condition.

## 2020-06-03 NOTE — Patient Instructions (Signed)
[facility] Discharge Instructions for Patients Receiving Chemotherapy  Today you received the following chemotherapy agents   To help prevent nausea and vomiting after your treatment, we encourage you to take your nausea medication   If you develop nausea and vomiting that is not controlled by your nausea medication, call the clinic.   BELOW ARE SYMPTOMS THAT SHOULD BE REPORTED IMMEDIATELY:  *FEVER GREATER THAN 100.5 F  *CHILLS WITH OR WITHOUT FEVER  NAUSEA AND VOMITING THAT IS NOT CONTROLLED WITH YOUR NAUSEA MEDICATION  *UNUSUAL SHORTNESS OF BREATH  *UNUSUAL BRUISING OR BLEEDING  TENDERNESS IN MOUTH AND THROAT WITH OR WITHOUT PRESENCE OF ULCERS  *URINARY PROBLEMS  *BOWEL PROBLEMS  UNUSUAL RASH Items with * indicate a potential emergency and should be followed up as soon as possible.  Feel free to call the clinic should you have any questions or concerns. The clinic phone number is [phone].  Please show the CHEMO ALERT CARD at check-in to the Emergency Department and triage nurse.   

## 2020-06-04 ENCOUNTER — Encounter (HOSPITAL_COMMUNITY): Payer: Self-pay

## 2020-06-04 ENCOUNTER — Other Ambulatory Visit: Payer: Self-pay

## 2020-06-04 ENCOUNTER — Ambulatory Visit (HOSPITAL_COMMUNITY): Payer: 59

## 2020-06-04 ENCOUNTER — Inpatient Hospital Stay (HOSPITAL_COMMUNITY): Payer: 59

## 2020-06-04 VITALS — BP 122/56 | HR 66 | Temp 96.9°F | Resp 18

## 2020-06-04 DIAGNOSIS — Z95828 Presence of other vascular implants and grafts: Secondary | ICD-10-CM

## 2020-06-04 DIAGNOSIS — D46Z Other myelodysplastic syndromes: Secondary | ICD-10-CM

## 2020-06-04 DIAGNOSIS — C9310 Chronic myelomonocytic leukemia not having achieved remission: Secondary | ICD-10-CM | POA: Diagnosis not present

## 2020-06-04 MED ORDER — SODIUM CHLORIDE 0.9 % IV SOLN: Freq: Once | INTRAVENOUS | Status: AC

## 2020-06-04 MED ORDER — PALONOSETRON HCL INJECTION 0.25 MG/5ML
0.2500 mg | Freq: Once | INTRAVENOUS | Status: AC
  Administered 2020-06-04: 0.25 mg via INTRAVENOUS

## 2020-06-04 MED ORDER — SODIUM CHLORIDE 0.9 % IV SOLN
100.0000 mg/m2 | Freq: Once | INTRAVENOUS | Status: AC
  Administered 2020-06-04: 225 mg via INTRAVENOUS
  Filled 2020-06-04: qty 22.5

## 2020-06-04 MED ORDER — HEPARIN SOD (PORK) LOCK FLUSH 100 UNIT/ML IV SOLN
500.0000 [IU] | Freq: Once | INTRAVENOUS | Status: AC | PRN
  Administered 2020-06-04: 500 [IU]

## 2020-06-04 MED ORDER — SODIUM CHLORIDE 0.9 % IV SOLN
10.0000 mg | Freq: Once | INTRAVENOUS | Status: AC
  Administered 2020-06-04: 10 mg via INTRAVENOUS
  Filled 2020-06-04: qty 10

## 2020-06-04 MED ORDER — SODIUM CHLORIDE 0.9% FLUSH
10.0000 mL | INTRAVENOUS | Status: DC | PRN
  Administered 2020-06-04: 10 mL

## 2020-06-04 MED ORDER — PALONOSETRON HCL INJECTION 0.25 MG/5ML
INTRAVENOUS | Status: AC
  Filled 2020-06-04: qty 5

## 2020-06-04 NOTE — Patient Instructions (Signed)
[facility] Discharge Instructions for Patients Receiving Chemotherapy  Today you received the following chemotherapy agents   To help prevent nausea and vomiting after your treatment, we encourage you to take your nausea medication   If you develop nausea and vomiting that is not controlled by your nausea medication, call the clinic.   BELOW ARE SYMPTOMS THAT SHOULD BE REPORTED IMMEDIATELY:  *FEVER GREATER THAN 100.5 F  *CHILLS WITH OR WITHOUT FEVER  NAUSEA AND VOMITING THAT IS NOT CONTROLLED WITH YOUR NAUSEA MEDICATION  *UNUSUAL SHORTNESS OF BREATH  *UNUSUAL BRUISING OR BLEEDING  TENDERNESS IN MOUTH AND THROAT WITH OR WITHOUT PRESENCE OF ULCERS  *URINARY PROBLEMS  *BOWEL PROBLEMS  UNUSUAL RASH Items with * indicate a potential emergency and should be followed up as soon as possible.  Feel free to call the clinic should you have any questions or concerns. The clinic phone number is [phone].  Please show the CHEMO ALERT CARD at check-in to the Emergency Department and triage nurse.   

## 2020-06-04 NOTE — Progress Notes (Signed)
Pt here for D3 of vidaza.  No complaints.  Vital signs stable for treatment.   Tolerated treatment well today without incidence today.  Discharged in stable condition ambulatory.  Vital signs stable prior to discharge.

## 2020-06-05 ENCOUNTER — Ambulatory Visit (HOSPITAL_COMMUNITY): Payer: 59

## 2020-06-05 ENCOUNTER — Inpatient Hospital Stay (HOSPITAL_COMMUNITY): Payer: 59

## 2020-06-05 ENCOUNTER — Encounter (HOSPITAL_COMMUNITY): Payer: Self-pay

## 2020-06-05 VITALS — BP 115/60 | HR 66 | Temp 97.2°F | Resp 18

## 2020-06-05 DIAGNOSIS — D46Z Other myelodysplastic syndromes: Secondary | ICD-10-CM

## 2020-06-05 DIAGNOSIS — Z95828 Presence of other vascular implants and grafts: Secondary | ICD-10-CM

## 2020-06-05 DIAGNOSIS — C9310 Chronic myelomonocytic leukemia not having achieved remission: Secondary | ICD-10-CM | POA: Diagnosis not present

## 2020-06-05 LAB — CBC WITH DIFFERENTIAL/PLATELET
Basophils Absolute: 0 10*3/uL (ref 0.0–0.1)
Basophils Relative: 0 %
Blasts: 7 %
Eosinophils Absolute: 0.1 10*3/uL (ref 0.0–0.5)
Eosinophils Relative: 1 %
HCT: 20.8 % — ABNORMAL LOW (ref 39.0–52.0)
Hemoglobin: 6.6 g/dL — CL (ref 13.0–17.0)
Lymphocytes Relative: 30 %
Lymphs Abs: 2.2 10*3/uL (ref 0.7–4.0)
MCH: 28.1 pg (ref 26.0–34.0)
MCHC: 31.7 g/dL (ref 30.0–36.0)
MCV: 88.5 fL (ref 80.0–100.0)
Monocytes Absolute: 2.8 10*3/uL — ABNORMAL HIGH (ref 0.1–1.0)
Monocytes Relative: 38 %
Neutro Abs: 1.5 10*3/uL — ABNORMAL LOW (ref 1.7–7.7)
Neutrophils Relative %: 21 %
Platelets: 49 10*3/uL — ABNORMAL LOW (ref 150–400)
Promyelocytes Relative: 3 %
RBC: 2.35 MIL/uL — ABNORMAL LOW (ref 4.22–5.81)
RDW: 14.3 % (ref 11.5–15.5)
WBC: 7.3 10*3/uL (ref 4.0–10.5)
nRBC: 0 % (ref 0.0–0.2)

## 2020-06-05 LAB — COMPREHENSIVE METABOLIC PANEL
ALT: 79 U/L — ABNORMAL HIGH (ref 0–44)
AST: 24 U/L (ref 15–41)
Albumin: 3.4 g/dL — ABNORMAL LOW (ref 3.5–5.0)
Alkaline Phosphatase: 43 U/L (ref 38–126)
Anion gap: 4 — ABNORMAL LOW (ref 5–15)
BUN: 24 mg/dL — ABNORMAL HIGH (ref 6–20)
CO2: 24 mmol/L (ref 22–32)
Calcium: 8.7 mg/dL — ABNORMAL LOW (ref 8.9–10.3)
Chloride: 109 mmol/L (ref 98–111)
Creatinine, Ser: 0.88 mg/dL (ref 0.61–1.24)
GFR, Estimated: >60 mL/min (ref >60)
Glucose, Bld: 111 mg/dL — ABNORMAL HIGH (ref 70–99)
Potassium: 3.7 mmol/L (ref 3.5–5.1)
Sodium: 137 mmol/L (ref 135–145)
Total Bilirubin: 0.5 mg/dL (ref 0.3–1.2)
Total Protein: 6.1 g/dL — ABNORMAL LOW (ref 6.5–8.1)

## 2020-06-05 LAB — PREPARE RBC (CROSSMATCH)

## 2020-06-05 LAB — LACTATE DEHYDROGENASE: LDH: 115 U/L (ref 98–192)

## 2020-06-05 MED ORDER — SODIUM CHLORIDE 0.9 % IV SOLN: Freq: Once | INTRAVENOUS | Status: AC

## 2020-06-05 MED ORDER — HEPARIN SOD (PORK) LOCK FLUSH 100 UNIT/ML IV SOLN
500.0000 [IU] | Freq: Once | INTRAVENOUS | Status: AC | PRN
  Administered 2020-06-05: 500 [IU]

## 2020-06-05 MED ORDER — SODIUM CHLORIDE 0.9 % IV SOLN
10.0000 mg | Freq: Once | INTRAVENOUS | Status: AC
  Administered 2020-06-05: 10 mg via INTRAVENOUS
  Filled 2020-06-05: qty 10

## 2020-06-05 MED ORDER — SODIUM CHLORIDE 0.9% FLUSH
10.0000 mL | INTRAVENOUS | Status: DC | PRN
  Administered 2020-06-05: 10 mL

## 2020-06-05 MED ORDER — SODIUM CHLORIDE 0.9 % IV SOLN
100.0000 mg/m2 | Freq: Once | INTRAVENOUS | Status: AC
  Administered 2020-06-05: 225 mg via INTRAVENOUS
  Filled 2020-06-05: qty 22.5

## 2020-06-05 NOTE — Progress Notes (Signed)
Hgb 6.6 today for treatment.  Patient to receive one unit of blood tomorrow and ok to treat today verbal order Dr. Delton Coombes.  No s/s of distress.    Patient tolerated chemotherapy with no complaints voiced.  Side effects with management reviewed with understanding verbalized.  Port site clean and dry with no bruising or swelling noted at site.  Good blood return noted before and after administration of chemotherapy.  Band aid applied.  Patient left in satisfactory condition with VSS and no s/s of distress noted.

## 2020-06-06 ENCOUNTER — Inpatient Hospital Stay (HOSPITAL_COMMUNITY): Payer: 59

## 2020-06-06 ENCOUNTER — Ambulatory Visit (HOSPITAL_COMMUNITY): Payer: 59

## 2020-06-06 ENCOUNTER — Other Ambulatory Visit: Payer: Self-pay

## 2020-06-06 ENCOUNTER — Encounter (HOSPITAL_COMMUNITY): Payer: Self-pay

## 2020-06-06 VITALS — BP 120/66 | HR 80 | Temp 97.7°F | Resp 18

## 2020-06-06 DIAGNOSIS — D46Z Other myelodysplastic syndromes: Secondary | ICD-10-CM

## 2020-06-06 DIAGNOSIS — C9310 Chronic myelomonocytic leukemia not having achieved remission: Secondary | ICD-10-CM | POA: Diagnosis not present

## 2020-06-06 DIAGNOSIS — Z95828 Presence of other vascular implants and grafts: Secondary | ICD-10-CM

## 2020-06-06 MED ORDER — SODIUM CHLORIDE 0.9 % IV SOLN
10.0000 mg | Freq: Once | INTRAVENOUS | Status: AC
  Administered 2020-06-06: 10 mg via INTRAVENOUS
  Filled 2020-06-06: qty 10

## 2020-06-06 MED ORDER — PALONOSETRON HCL INJECTION 0.25 MG/5ML
0.2500 mg | Freq: Once | INTRAVENOUS | Status: AC
  Administered 2020-06-06: 0.25 mg via INTRAVENOUS
  Filled 2020-06-06: qty 5

## 2020-06-06 MED ORDER — HEPARIN SOD (PORK) LOCK FLUSH 100 UNIT/ML IV SOLN
500.0000 [IU] | Freq: Once | INTRAVENOUS | Status: AC | PRN
  Administered 2020-06-06: 500 [IU]

## 2020-06-06 MED ORDER — SODIUM CHLORIDE 0.9 % IV SOLN: Freq: Once | INTRAVENOUS | Status: AC

## 2020-06-06 MED ORDER — ACETAMINOPHEN 325 MG PO TABS: 650.0000 mg | ORAL_TABLET | Freq: Once | ORAL | Status: DC

## 2020-06-06 MED ORDER — SODIUM CHLORIDE 0.9% IV SOLUTION
250.0000 mL | Freq: Once | INTRAVENOUS | Status: AC
  Administered 2020-06-06: 250 mL via INTRAVENOUS

## 2020-06-06 MED ORDER — SODIUM CHLORIDE 0.9% FLUSH
10.0000 mL | INTRAVENOUS | Status: DC | PRN
  Administered 2020-06-06 (×2): 10 mL

## 2020-06-06 MED ORDER — DIPHENHYDRAMINE HCL 25 MG PO CAPS: 25.0000 mg | ORAL_CAPSULE | Freq: Once | ORAL | Status: DC

## 2020-06-06 MED ORDER — SODIUM CHLORIDE 0.9 % IV SOLN
100.0000 mg/m2 | Freq: Once | INTRAVENOUS | Status: AC
  Administered 2020-06-06: 225 mg via INTRAVENOUS
  Filled 2020-06-06: qty 22.5

## 2020-06-06 NOTE — Progress Notes (Signed)
Patient tolerated chemotherapy and blood transfusion with no complaints voiced.  Side effects with management reviewed with understanding verbalized.  Port site clean and dry with no bruising or swelling noted at site.  Good blood return noted before and after administration of chemotherapy.  Band aid applied.  Patient left in satisfactory condition with VSS and no s/s of distress noted.

## 2020-06-07 LAB — TYPE AND SCREEN
ABO/RH(D): O POS
Antibody Screen: NEGATIVE
Unit division: 0

## 2020-06-07 LAB — BPAM RBC
Blood Product Expiration Date: 202202172359
ISSUE DATE / TIME: 202201280943
Unit Type and Rh: 5100

## 2020-06-09 ENCOUNTER — Other Ambulatory Visit: Payer: Self-pay

## 2020-06-09 ENCOUNTER — Encounter (HOSPITAL_COMMUNITY): Payer: Self-pay | Admitting: *Deleted

## 2020-06-09 ENCOUNTER — Encounter (HOSPITAL_COMMUNITY): Payer: Self-pay

## 2020-06-09 ENCOUNTER — Inpatient Hospital Stay (HOSPITAL_COMMUNITY): Payer: 59

## 2020-06-09 VITALS — BP 122/69 | HR 85 | Temp 97.3°F | Resp 18

## 2020-06-09 DIAGNOSIS — C9310 Chronic myelomonocytic leukemia not having achieved remission: Secondary | ICD-10-CM | POA: Diagnosis not present

## 2020-06-09 DIAGNOSIS — D46Z Other myelodysplastic syndromes: Secondary | ICD-10-CM

## 2020-06-09 DIAGNOSIS — Z95828 Presence of other vascular implants and grafts: Secondary | ICD-10-CM

## 2020-06-09 LAB — COMPREHENSIVE METABOLIC PANEL
ALT: 65 U/L — ABNORMAL HIGH (ref 0–44)
AST: 25 U/L (ref 15–41)
Albumin: 3.3 g/dL — ABNORMAL LOW (ref 3.5–5.0)
Alkaline Phosphatase: 35 U/L — ABNORMAL LOW (ref 38–126)
Anion gap: 4 — ABNORMAL LOW (ref 5–15)
BUN: 19 mg/dL (ref 6–20)
CO2: 26 mmol/L (ref 22–32)
Calcium: 8.6 mg/dL — ABNORMAL LOW (ref 8.9–10.3)
Chloride: 105 mmol/L (ref 98–111)
Creatinine, Ser: 0.84 mg/dL (ref 0.61–1.24)
GFR, Estimated: >60 mL/min (ref >60)
Glucose, Bld: 101 mg/dL — ABNORMAL HIGH (ref 70–99)
Potassium: 4.2 mmol/L (ref 3.5–5.1)
Sodium: 135 mmol/L (ref 135–145)
Total Bilirubin: 0.7 mg/dL (ref 0.3–1.2)
Total Protein: 5.9 g/dL — ABNORMAL LOW (ref 6.5–8.1)

## 2020-06-09 LAB — CBC WITH DIFFERENTIAL/PLATELET
Basophils Absolute: 0 10*3/uL (ref 0.0–0.1)
Basophils Relative: 0 %
Blasts: 2 %
Eosinophils Absolute: 0.1 10*3/uL (ref 0.0–0.5)
Eosinophils Relative: 4 %
HCT: 21.3 % — ABNORMAL LOW (ref 39.0–52.0)
Hemoglobin: 7 g/dL — ABNORMAL LOW (ref 13.0–17.0)
Lymphocytes Relative: 44 %
Lymphs Abs: 1.1 10*3/uL (ref 0.7–4.0)
MCH: 28.9 pg (ref 26.0–34.0)
MCHC: 32.9 g/dL (ref 30.0–36.0)
MCV: 88 fL (ref 80.0–100.0)
Metamyelocytes Relative: 1 %
Monocytes Absolute: 0.8 10*3/uL (ref 0.1–1.0)
Monocytes Relative: 33 %
Myelocytes: 2 %
Neutro Abs: 0.3 10*3/uL — CL (ref 1.7–7.7)
Neutrophils Relative %: 12 %
Platelets: 42 10*3/uL — ABNORMAL LOW (ref 150–400)
Promyelocytes Relative: 2 %
RBC: 2.42 MIL/uL — ABNORMAL LOW (ref 4.22–5.81)
RDW: 14.3 % (ref 11.5–15.5)
WBC: 2.5 10*3/uL — ABNORMAL LOW (ref 4.0–10.5)
nRBC: 0 % (ref 0.0–0.2)

## 2020-06-09 LAB — PREPARE RBC (CROSSMATCH)

## 2020-06-09 LAB — SAMPLE TO BLOOD BANK

## 2020-06-09 MED ORDER — SODIUM CHLORIDE 0.9% FLUSH
10.0000 mL | INTRAVENOUS | Status: DC | PRN
  Administered 2020-06-09: 10 mL

## 2020-06-09 MED ORDER — SODIUM CHLORIDE 0.9 % IV SOLN
10.0000 mg | Freq: Once | INTRAVENOUS | Status: AC
  Administered 2020-06-09: 10 mg via INTRAVENOUS
  Filled 2020-06-09: qty 10

## 2020-06-09 MED ORDER — SODIUM CHLORIDE 0.9 % IV SOLN
100.0000 mg/m2 | Freq: Once | INTRAVENOUS | Status: AC
  Administered 2020-06-09: 225 mg via INTRAVENOUS
  Filled 2020-06-09: qty 22.5

## 2020-06-09 MED ORDER — PALONOSETRON HCL INJECTION 0.25 MG/5ML
0.2500 mg | Freq: Once | INTRAVENOUS | Status: AC
  Administered 2020-06-09: 0.25 mg via INTRAVENOUS
  Filled 2020-06-09: qty 5

## 2020-06-09 MED ORDER — HEPARIN SOD (PORK) LOCK FLUSH 100 UNIT/ML IV SOLN
500.0000 [IU] | Freq: Once | INTRAVENOUS | Status: AC | PRN
  Administered 2020-06-09: 500 [IU]

## 2020-06-09 MED ORDER — SODIUM CHLORIDE 0.9 % IV SOLN: Freq: Once | INTRAVENOUS | Status: AC

## 2020-06-09 NOTE — Patient Instructions (Signed)
[facility] Discharge Instructions for Patients Receiving Chemotherapy  Today you received the following chemotherapy agents   To help prevent nausea and vomiting after your treatment, we encourage you to take your nausea medication   If you develop nausea and vomiting that is not controlled by your nausea medication, call the clinic.   BELOW ARE SYMPTOMS THAT SHOULD BE REPORTED IMMEDIATELY:  *FEVER GREATER THAN 100.5 F  *CHILLS WITH OR WITHOUT FEVER  NAUSEA AND VOMITING THAT IS NOT CONTROLLED WITH YOUR NAUSEA MEDICATION  *UNUSUAL SHORTNESS OF BREATH  *UNUSUAL BRUISING OR BLEEDING  TENDERNESS IN MOUTH AND THROAT WITH OR WITHOUT PRESENCE OF ULCERS  *URINARY PROBLEMS  *BOWEL PROBLEMS  UNUSUAL RASH Items with * indicate a potential emergency and should be followed up as soon as possible.  Feel free to call the clinic should you have any questions or concerns. The clinic phone number is [phone].  Please show the CHEMO ALERT CARD at check-in to the Emergency Department and triage nurse.   

## 2020-06-09 NOTE — Progress Notes (Signed)
Pt here for day 6 of vidaza. Hemoglobin 7.0 and platelets 42.  ANC 0.3. Okay for treatment today.  giving 1 unit of blood tomorrow.  Notified blood bank and orders placed for 1 unit of irradiated blood.    Tolerated treatment well today without incidence.  Vital signs stable prior to discharge.  Discharge ambulatory in stable condition.

## 2020-06-09 NOTE — Progress Notes (Signed)
Late entry 1040.  Critical value alert:   ANC 0.3  Dr. Delton Coombes aware and no orders received.

## 2020-06-10 ENCOUNTER — Inpatient Hospital Stay (HOSPITAL_COMMUNITY): Payer: 59 | Attending: Hematology

## 2020-06-10 ENCOUNTER — Encounter (HOSPITAL_COMMUNITY): Payer: Self-pay

## 2020-06-10 VITALS — BP 125/84 | HR 77 | Temp 97.1°F | Resp 17

## 2020-06-10 DIAGNOSIS — D61818 Other pancytopenia: Secondary | ICD-10-CM | POA: Diagnosis not present

## 2020-06-10 DIAGNOSIS — Z5111 Encounter for antineoplastic chemotherapy: Secondary | ICD-10-CM | POA: Insufficient documentation

## 2020-06-10 DIAGNOSIS — Z79899 Other long term (current) drug therapy: Secondary | ICD-10-CM | POA: Diagnosis not present

## 2020-06-10 DIAGNOSIS — Z87891 Personal history of nicotine dependence: Secondary | ICD-10-CM | POA: Diagnosis not present

## 2020-06-10 DIAGNOSIS — D46Z Other myelodysplastic syndromes: Secondary | ICD-10-CM

## 2020-06-10 DIAGNOSIS — Z95828 Presence of other vascular implants and grafts: Secondary | ICD-10-CM

## 2020-06-10 DIAGNOSIS — C9310 Chronic myelomonocytic leukemia not having achieved remission: Secondary | ICD-10-CM | POA: Diagnosis present

## 2020-06-10 MED ORDER — SODIUM CHLORIDE 0.9% FLUSH
10.0000 mL | INTRAVENOUS | Status: AC | PRN
  Administered 2020-06-10: 10 mL

## 2020-06-10 MED ORDER — SODIUM CHLORIDE 0.9% IV SOLUTION
250.0000 mL | Freq: Once | INTRAVENOUS | Status: AC
  Administered 2020-06-10: 250 mL via INTRAVENOUS

## 2020-06-10 MED ORDER — HEPARIN SOD (PORK) LOCK FLUSH 100 UNIT/ML IV SOLN: 500.0000 [IU] | Freq: Once | INTRAVENOUS | Status: DC | PRN

## 2020-06-10 MED ORDER — SODIUM CHLORIDE 0.9 % IV SOLN
10.0000 mg | Freq: Once | INTRAVENOUS | Status: AC
  Administered 2020-06-10: 10 mg via INTRAVENOUS
  Filled 2020-06-10: qty 10

## 2020-06-10 MED ORDER — SODIUM CHLORIDE 0.9 % IV SOLN
100.0000 mg/m2 | Freq: Once | INTRAVENOUS | Status: AC
  Administered 2020-06-10: 225 mg via INTRAVENOUS
  Filled 2020-06-10: qty 22.5

## 2020-06-10 MED ORDER — SODIUM CHLORIDE 0.9 % IV SOLN: Freq: Once | INTRAVENOUS | Status: AC

## 2020-06-10 MED ORDER — SODIUM CHLORIDE 0.9% FLUSH: 10.0000 mL | INTRAVENOUS | Status: DC | PRN

## 2020-06-10 MED ORDER — HEPARIN SOD (PORK) LOCK FLUSH 100 UNIT/ML IV SOLN
500.0000 [IU] | Freq: Every day | INTRAVENOUS | Status: AC | PRN
  Administered 2020-06-10: 500 [IU]

## 2020-06-10 NOTE — Progress Notes (Signed)
Pt here for day 7 of vidaza and 1 unit of PRBCs. Tolerated treatment well today without incidence.  Discharged in stable condition ambulatory.  Vital signs stable prior to discharge.

## 2020-06-10 NOTE — Patient Instructions (Signed)
[facility] Discharge Instructions for Patients Receiving Chemotherapy  Today you received the following chemotherapy agents   To help prevent nausea and vomiting after your treatment, we encourage you to take your nausea medication   If you develop nausea and vomiting that is not controlled by your nausea medication, call the clinic.   BELOW ARE SYMPTOMS THAT SHOULD BE REPORTED IMMEDIATELY:  *FEVER GREATER THAN 100.5 F  *CHILLS WITH OR WITHOUT FEVER  NAUSEA AND VOMITING THAT IS NOT CONTROLLED WITH YOUR NAUSEA MEDICATION  *UNUSUAL SHORTNESS OF BREATH  *UNUSUAL BRUISING OR BLEEDING  TENDERNESS IN MOUTH AND THROAT WITH OR WITHOUT PRESENCE OF ULCERS  *URINARY PROBLEMS  *BOWEL PROBLEMS  UNUSUAL RASH Items with * indicate a potential emergency and should be followed up as soon as possible.  Feel free to call the clinic should you have any questions or concerns. The clinic phone number is [phone].  Please show the CHEMO ALERT CARD at check-in to the Emergency Department and triage nurse.   

## 2020-06-11 LAB — TYPE AND SCREEN
ABO/RH(D): O POS
Antibody Screen: NEGATIVE
Unit division: 0

## 2020-06-11 LAB — BPAM RBC
Blood Product Expiration Date: 202202172359
ISSUE DATE / TIME: 202202011057
Unit Type and Rh: 9500

## 2020-06-16 ENCOUNTER — Other Ambulatory Visit (HOSPITAL_COMMUNITY): Payer: 59

## 2020-06-17 ENCOUNTER — Encounter (HOSPITAL_COMMUNITY): Payer: 59

## 2020-06-17 ENCOUNTER — Inpatient Hospital Stay (HOSPITAL_COMMUNITY): Payer: 59

## 2020-06-17 ENCOUNTER — Other Ambulatory Visit: Payer: Self-pay

## 2020-06-17 DIAGNOSIS — Z5111 Encounter for antineoplastic chemotherapy: Secondary | ICD-10-CM | POA: Diagnosis not present

## 2020-06-17 DIAGNOSIS — D46Z Other myelodysplastic syndromes: Secondary | ICD-10-CM

## 2020-06-17 LAB — COMPREHENSIVE METABOLIC PANEL
ALT: 103 U/L — ABNORMAL HIGH (ref 0–44)
AST: 47 U/L — ABNORMAL HIGH (ref 15–41)
Albumin: 3.4 g/dL — ABNORMAL LOW (ref 3.5–5.0)
Alkaline Phosphatase: 39 U/L (ref 38–126)
Anion gap: 5 (ref 5–15)
BUN: 15 mg/dL (ref 6–20)
CO2: 26 mmol/L (ref 22–32)
Calcium: 8.7 mg/dL — ABNORMAL LOW (ref 8.9–10.3)
Chloride: 106 mmol/L (ref 98–111)
Creatinine, Ser: 0.8 mg/dL (ref 0.61–1.24)
GFR, Estimated: >60 mL/min (ref >60)
Glucose, Bld: 122 mg/dL — ABNORMAL HIGH (ref 70–99)
Potassium: 4.2 mmol/L (ref 3.5–5.1)
Sodium: 137 mmol/L (ref 135–145)
Total Bilirubin: 0.8 mg/dL (ref 0.3–1.2)
Total Protein: 6.3 g/dL — ABNORMAL LOW (ref 6.5–8.1)

## 2020-06-17 LAB — CBC WITH DIFFERENTIAL/PLATELET
Abs Immature Granulocytes: 0.02 10*3/uL (ref 0.00–0.07)
Basophils Absolute: 0 10*3/uL (ref 0.0–0.1)
Basophils Relative: 0 %
Eosinophils Absolute: 0.1 10*3/uL (ref 0.0–0.5)
Eosinophils Relative: 4 %
HCT: 21.7 % — ABNORMAL LOW (ref 39.0–52.0)
Hemoglobin: 7 g/dL — ABNORMAL LOW (ref 13.0–17.0)
Immature Granulocytes: 1 %
Lymphocytes Relative: 39 %
Lymphs Abs: 0.6 10*3/uL — ABNORMAL LOW (ref 0.7–4.0)
MCH: 28.6 pg (ref 26.0–34.0)
MCHC: 32.3 g/dL (ref 30.0–36.0)
MCV: 88.6 fL (ref 80.0–100.0)
Monocytes Absolute: 0.6 10*3/uL (ref 0.1–1.0)
Monocytes Relative: 43 %
Neutro Abs: 0.2 10*3/uL — CL (ref 1.7–7.7)
Neutrophils Relative %: 13 %
Platelets: 20 10*3/uL — CL (ref 150–400)
RBC: 2.45 MIL/uL — ABNORMAL LOW (ref 4.22–5.81)
RDW: 14 % (ref 11.5–15.5)
WBC: 1.5 10*3/uL — ABNORMAL LOW (ref 4.0–10.5)
nRBC: 0 % (ref 0.0–0.2)

## 2020-06-17 LAB — LACTATE DEHYDROGENASE: LDH: 131 U/L (ref 98–192)

## 2020-06-17 LAB — SAMPLE TO BLOOD BANK

## 2020-06-17 LAB — PREPARE RBC (CROSSMATCH)

## 2020-06-17 NOTE — Progress Notes (Signed)
Patients port flushed without difficulty.  Good blood return noted with no bruising or swelling noted at site.  Band aid applied.  VSS with discharge and left in satisfactory condition with no s/s of distress noted.   

## 2020-06-18 ENCOUNTER — Encounter (HOSPITAL_COMMUNITY): Payer: Self-pay

## 2020-06-18 ENCOUNTER — Inpatient Hospital Stay (HOSPITAL_COMMUNITY): Payer: 59

## 2020-06-18 VITALS — BP 130/80 | HR 81 | Temp 97.1°F | Resp 18

## 2020-06-18 DIAGNOSIS — D649 Anemia, unspecified: Secondary | ICD-10-CM

## 2020-06-18 DIAGNOSIS — Z5111 Encounter for antineoplastic chemotherapy: Secondary | ICD-10-CM | POA: Diagnosis not present

## 2020-06-18 DIAGNOSIS — D46Z Other myelodysplastic syndromes: Secondary | ICD-10-CM

## 2020-06-18 MED ORDER — SODIUM CHLORIDE 0.9% FLUSH
10.0000 mL | INTRAVENOUS | Status: AC | PRN
  Administered 2020-06-18: 10 mL

## 2020-06-18 MED ORDER — SODIUM CHLORIDE 0.9% IV SOLUTION
250.0000 mL | Freq: Once | INTRAVENOUS | Status: AC
  Administered 2020-06-18: 250 mL via INTRAVENOUS

## 2020-06-18 MED ORDER — HEPARIN SOD (PORK) LOCK FLUSH 100 UNIT/ML IV SOLN
500.0000 [IU] | Freq: Every day | INTRAVENOUS | Status: AC | PRN
  Administered 2020-06-18: 500 [IU]

## 2020-06-18 NOTE — Progress Notes (Signed)
Tolerated 1 unit of PRBCs today without incidence.  Discharged ambulatory in stable condition.  Vital signs stable prior to discharge.

## 2020-06-18 NOTE — Patient Instructions (Signed)
[facility] at [facility]  Discharge Instructions:   _______________________________________________________________  Thank you for choosing [facility] at [facility] to provide your oncology and hematology care.  To afford each patient quality time with our providers, please arrive at least 15 minutes before your scheduled appointment.  You need to re-schedule your appointment if you arrive 10 or more minutes late.  We strive to give you quality time with our providers, and arriving late affects you and other patients whose appointments are after yours.  Also, if you no show three or more times for appointments you may be dismissed from the clinic.  Again, thank you for choosing [facility]. Our hope is that these requests will allow you access to exceptional care and in a timely manner. _______________________________________________________________  If you have questions after your visit, please contact our office at [phone] between the hours of 8:30 a.m. and 5:00 p.m. Voicemails left after 4:30 p.m. will not be returned until the following business day. _______________________________________________________________  For prescription refill requests, have your pharmacy contact our office. _______________________________________________________________  Recommendations made by the consultant and any test results will be sent to your referring physician. _______________________________________________________________ 

## 2020-06-19 LAB — BPAM RBC
Blood Product Expiration Date: 202202172359
ISSUE DATE / TIME: 202202090926
Unit Type and Rh: 9500

## 2020-06-19 LAB — TYPE AND SCREEN
ABO/RH(D): O POS
Antibody Screen: NEGATIVE
Unit division: 0

## 2020-06-19 MED ORDER — DIPHENHYDRAMINE HCL 25 MG PO CAPS
ORAL_CAPSULE | ORAL | Status: AC
  Filled 2020-06-19: qty 1

## 2020-06-19 MED ORDER — ACETAMINOPHEN 325 MG PO TABS
ORAL_TABLET | ORAL | Status: AC
  Filled 2020-06-19: qty 2

## 2020-06-19 NOTE — Progress Notes (Signed)
CRITICAL VALUE ALERT  Critical Value:  Platelets 20  Date & Time Notied:  06/17/2020 at 1050  Provider Notified: Dr. Delton Coombes  Orders Received/Actions taken: n/a re:  Platelets; transfuse 1 unit PRBC for symptomatic anemia (hgb 7.0).

## 2020-06-23 ENCOUNTER — Other Ambulatory Visit (HOSPITAL_COMMUNITY): Payer: 59

## 2020-06-24 ENCOUNTER — Encounter (HOSPITAL_COMMUNITY): Payer: 59

## 2020-06-26 NOTE — Progress Notes (Signed)
[place], [place] [address]   CLINIC:  Medical Oncology/Hematology  PCP:  Patient, No Pcp Per None None   REASON FOR VISIT:  Follow-up for MDS, macrocytic anemia & thrombocytopenia  PRIOR THERAPY: None  NGS Results: Not done  CURRENT THERAPY: Azacitidine & Aloxi every 4 weeks  BRIEF ONCOLOGIC HISTORY:  Oncology History  Myelodysplastic syndrome, high grade (HCC)  12/28/2019 Initial Diagnosis   MDS (myelodysplastic syndrome), high grade ([place])   12/31/2019 -  Chemotherapy    Patient is on Treatment Plan: MYELODYSPLASIA  AZACITIDINE IV D1-7 Q28D        CANCER STAGING: Cancer Staging No matching staging information was found for the patient.  INTERVAL HISTORY:  [name], a 60 y.o. male, returns for routine follow-up and consideration for next cycle of chemotherapy. [place] was last seen on 06/02/2020.  Due for cycle #7 of azacitidine and Aloxi today.   Overall, he tells me he has been feeling pretty well.  He does report some dyspnea on exertion.  Denies any fevers or infections.  Overall, he feels ready for next cycle of chemo today.    REVIEW OF SYSTEMS:  Review of Systems  Respiratory: Positive for shortness of breath.   All other systems reviewed and are negative.   PAST MEDICAL/SURGICAL HISTORY:  Past Medical History:  Diagnosis Date  . Cancer ([place])   . Port-A-Cath in place 12/28/2019   Past Surgical History:  Procedure Laterality Date  . COLONOSCOPY    . PORTACATH PLACEMENT Right 01/18/2020   Procedure: INSERTION PORT-A-CATH;  Surgeon: Aviva Signs, MD;  Location: [facility];  Service: General;  Laterality: Right;    SOCIAL HISTORY:  Social History   Socioeconomic History  . Marital status: Married    Spouse name: Not on file  . Number of children: Not on file  . Years of education: Not on file  . Highest education level: Not on file  Occupational History  . Occupation: Employed  Tobacco Use  .  Smoking status: Former Smoker    Types: Cigarettes    Quit date: 05/03/1988    Years since quitting: 32.1  . Smokeless tobacco: Never Used  Substance and Sexual Activity  . Alcohol use: Yes    Alcohol/week: 1.0 - 2.0 standard drink    Types: 1 - 2 Standard drinks or equivalent per week  . Drug use: Never  . Sexual activity: Yes  Other Topics Concern  . Not on file  Social History Narrative  . Not on file   Social Determinants of Health   Financial Resource Strain: Low Risk   . Difficulty of Paying Living Expenses: Not hard at all  Food Insecurity: No Food Insecurity  . Worried About Charity fundraiser in the Last Year: Never true  . Ran Out of Food in the Last Year: Never true  Transportation Needs: No Transportation Needs  . Lack of Transportation (Medical): No  . Lack of Transportation (Non-Medical): No  Physical Activity: Inactive  . Days of Exercise per Week: 0 days  . Minutes of Exercise per Session: 0 min  Stress: No Stress Concern Present  . Feeling of Stress : Not at all  Social Connections: Moderately Integrated  . Frequency of Communication with Friends and Family: More than three times a week  . Frequency of Social Gatherings with Friends and Family: Three times a week  . Attends Religious Services: 1 to 4 times per year  . Active Member  of Clubs or Organizations: No  . Attends Archivist Meetings: Never  . Marital Status: Married  Human resources officer Violence: Not At Risk  . Fear of Current or Ex-Partner: No  . Emotionally Abused: No  . Physically Abused: No  . Sexually Abused: No    FAMILY HISTORY:  Family History  Problem Relation Age of Onset  . Bladder Cancer Father   . Scoliosis Sister     CURRENT MEDICATIONS:  Current Outpatient Medications  Medication Sig Dispense Refill  . amoxicillin (AMOXIL) 500 MG capsule Take 1 capsule (500 mg total) by mouth 3 (three) times daily. 21 capsule 0  . azaCITIDine 5 mg/2 mLs in lactated ringers  infusion Inject into the vein daily. Days 1-7 every 28 days    . docusate sodium (COLACE) 100 MG capsule Take 100 mg by mouth 2 (two) times daily.    Marland Kitchen lidocaine-prilocaine (EMLA) cream Apply a small amount to port a cath site and cover with plastic wrap 1 hour prior to chemotherapy appointments 30 g 3  . Multiple Vitamin (MULTIVITAMIN) tablet Take 1 tablet by mouth daily.    . prochlorperazine (COMPAZINE) 10 MG tablet Take 1 tablet (10 mg total) by mouth every 6 (six) hours as needed (Nausea or vomiting). 30 tablet 1   No current facility-administered medications for this visit.    ALLERGIES:  No Known Allergies  PHYSICAL EXAM:  Performance status (ECOG): 1 - Symptomatic but completely ambulatory  There were no vitals filed for this visit. Wt Readings from Last 3 Encounters:  06/09/20 238 lb 3.2 oz (108 kg)  06/03/20 237 lb 12.8 oz (107.9 kg)  06/02/20 235 lb 3.7 oz (106.7 kg)   Physical Exam Vitals reviewed.  Constitutional:      Appearance: Normal appearance.  Cardiovascular:     Rate and Rhythm: Normal rate and regular rhythm.     Heart sounds: Normal heart sounds.  Pulmonary:     Effort: Pulmonary effort is normal.     Breath sounds: Normal breath sounds.  Chest:     Comments: Port-a-Cath in R chest Abdominal:     Palpations: Abdomen is soft.  Skin:    General: Skin is warm.  Neurological:     General: No focal deficit present.     Mental Status: He is alert and oriented to person, place, and time.  Psychiatric:        Mood and Affect: Mood normal.        Behavior: Behavior normal.     LABORATORY DATA:  I have reviewed the labs as listed.  CBC Latest Ref Rng & Units 06/17/2020 06/09/2020 06/05/2020  WBC 4.0 - 10.5 K/uL 1.5(L) 2.5(L) 7.3  Hemoglobin 13.0 - 17.0 g/dL 7.0(L) 7.0(L) 6.6(LL)  Hematocrit 39.0 - 52.0 % 21.7(L) 21.3(L) 20.8(L)  Platelets 150 - 400 K/uL 20(LL) 42(L) 49(L)   CMP Latest Ref Rng & Units 06/17/2020 06/09/2020 06/05/2020  Glucose 70 - 99 mg/dL  122(H) 101(H) 111(H)  BUN 6 - 20 mg/dL 15 19 24(H)  Creatinine 0.61 - 1.24 mg/dL 0.80 0.84 0.88  Sodium 135 - 145 mmol/L 137 135 137  Potassium 3.5 - 5.1 mmol/L 4.2 4.2 3.7  Chloride 98 - 111 mmol/L 106 105 109  CO2 22 - 32 mmol/L _0 Calcium 8.9 - 10.3 mg/dL 8.7(L) 8.6(L) 8.7(L)  Total Protein 6.5 - 8.1 g/dL 6.3(L) 5.9(L) 6.1(L)  Total Bilirubin 0.3 - 1.2 mg/dL 0.8 0.7 0.5  Alkaline Phos 38 - 126 U/L 39  35(L) 43  AST 15 - 41 U/L 47(H) 25 24  ALT 0 - 44 U/L 103(H) 65(H) 79(H)   Lab Results  Component Value Date   LDH 131 06/17/2020   LDH 115 06/05/2020   LDH 156 06/02/2020    DIAGNOSTIC IMAGING:  I have independently reviewed the scans and discussed with the patient. No results found.   ASSESSMENT:  1.High-grade MDS with excess blasts/CMML: -Presentation with pancytopenia and transfusion dependent anemia. -Bone marrow biopsy on 12/14/2019 at [facility] showed hypercellular marrow (90%) with dyserythropoiesis, dysgranulopoiesis and 16% blasts. Findings consistent with MDS-EB 2. FLT3 ITD/TKD negative. -Chromosome analysis 47, XY, +8[19]/46, XY[1]. -MDS FISH panel pending. -However his last 2 CBC showing increased white count of 14-15 K with peripheral blood monocytosis, also likely CMML. -He was evaluated by [facility]. Jerrye Noble at [facility]. Bone marrow biopsy after cycle 2 at [facility] recommended. -Cycle 1 of azacitidine (5+2) on 12/31/2019. 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 Current biopsy shows similar hypercellular marrow with myeloid and erythroid dysplasia and blast fraction has decreased from 16% to 5-10%. -Bone marrow biopsy on 03/25/2020 shows hypercellular marrow (95% cellularity) involved by CMML, 5 to 10% blasts. Compared to bone marrow biopsy from 02/01/2020, blasts were similar. -Cycle 4 of azacitidine dose increased to 100 mg per metered squared on 04/07/2020.   PLAN:  1.High-grade MDS/CMML: -He has completed 6  cycles of azacitidine.  Last 3 cycles were dose increased to 100 mg per metered square. -Today his CBC shows white count 1.7 with ANC of 200.  Hemoglobin is 7.9 and platelet count improved to 116. -LDH was normal.  He had a bone marrow biopsy on 06/24/2020 at [facility] which showed no increase in blasts. -He is meeting with Dr. Junius Finner on 07/07/2020. 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 -Today he does not require any blood transfusion or platelet transfusion.  He will proceed with the transplant plan.  2. Thrombocytopenia: -Platelet count improved to 116.  3. Macrocytic anemia: -Hemoglobin today 7.9.  Last transfusion was last week at [facility].  Today no transfusion needed.   Orders placed this encounter:  No orders of the defined types were placed in this encounter.    Derek Jack, MD [place] [phone]   I, Milinda Antis, am acting as a scribe for Dr. Sanda Linger.  I, Derek Jack MD, have reviewed the above documentation for accuracy and completeness, and I agree with the above.

## 2020-06-30 ENCOUNTER — Encounter (HOSPITAL_COMMUNITY): Payer: Self-pay

## 2020-06-30 ENCOUNTER — Other Ambulatory Visit: Payer: Self-pay

## 2020-06-30 ENCOUNTER — Inpatient Hospital Stay (HOSPITAL_COMMUNITY): Payer: 59

## 2020-06-30 ENCOUNTER — Inpatient Hospital Stay (HOSPITAL_BASED_OUTPATIENT_CLINIC_OR_DEPARTMENT_OTHER): Payer: 59 | Admitting: Hematology

## 2020-06-30 VITALS — BP 137/77 | HR 85 | Temp 96.8°F | Resp 18 | Wt 239.0 lb

## 2020-06-30 DIAGNOSIS — D696 Thrombocytopenia, unspecified: Secondary | ICD-10-CM | POA: Diagnosis not present

## 2020-06-30 DIAGNOSIS — Z5111 Encounter for antineoplastic chemotherapy: Secondary | ICD-10-CM | POA: Diagnosis not present

## 2020-06-30 DIAGNOSIS — D46Z Other myelodysplastic syndromes: Secondary | ICD-10-CM | POA: Diagnosis not present

## 2020-06-30 DIAGNOSIS — D539 Nutritional anemia, unspecified: Secondary | ICD-10-CM | POA: Diagnosis not present

## 2020-06-30 LAB — COMPREHENSIVE METABOLIC PANEL
ALT: 71 U/L — ABNORMAL HIGH (ref 0–44)
AST: 32 U/L (ref 15–41)
Albumin: 3.5 g/dL (ref 3.5–5.0)
Alkaline Phosphatase: 55 U/L (ref 38–126)
Anion gap: 7 (ref 5–15)
BUN: 17 mg/dL (ref 6–20)
CO2: 23 mmol/L (ref 22–32)
Calcium: 8.9 mg/dL (ref 8.9–10.3)
Chloride: 108 mmol/L (ref 98–111)
Creatinine, Ser: 0.76 mg/dL (ref 0.61–1.24)
GFR, Estimated: >60 mL/min (ref >60)
Glucose, Bld: 104 mg/dL — ABNORMAL HIGH (ref 70–99)
Potassium: 4.4 mmol/L (ref 3.5–5.1)
Sodium: 138 mmol/L (ref 135–145)
Total Bilirubin: 0.7 mg/dL (ref 0.3–1.2)
Total Protein: 6.5 g/dL (ref 6.5–8.1)

## 2020-06-30 LAB — LACTATE DEHYDROGENASE: LDH: 125 U/L (ref 98–192)

## 2020-06-30 LAB — CBC WITH DIFFERENTIAL/PLATELET
Abs Immature Granulocytes: 0.03 10*3/uL (ref 0.00–0.07)
Basophils Absolute: 0 10*3/uL (ref 0.0–0.1)
Basophils Relative: 1 %
Eosinophils Absolute: 0.1 10*3/uL (ref 0.0–0.5)
Eosinophils Relative: 4 %
HCT: 24.5 % — ABNORMAL LOW (ref 39.0–52.0)
Hemoglobin: 7.9 g/dL — ABNORMAL LOW (ref 13.0–17.0)
Immature Granulocytes: 2 %
Lymphocytes Relative: 37 %
Lymphs Abs: 0.6 10*3/uL — ABNORMAL LOW (ref 0.7–4.0)
MCH: 29.7 pg (ref 26.0–34.0)
MCHC: 32.2 g/dL (ref 30.0–36.0)
MCV: 92.1 fL (ref 80.0–100.0)
Monocytes Absolute: 0.8 10*3/uL (ref 0.1–1.0)
Monocytes Relative: 47 %
Neutro Abs: 0.2 10*3/uL — CL (ref 1.7–7.7)
Neutrophils Relative %: 9 %
Platelets: 116 10*3/uL — ABNORMAL LOW (ref 150–400)
RBC: 2.66 MIL/uL — ABNORMAL LOW (ref 4.22–5.81)
RDW: 14.7 % (ref 11.5–15.5)
WBC: 1.7 10*3/uL — ABNORMAL LOW (ref 4.0–10.5)
nRBC: 0 % (ref 0.0–0.2)

## 2020-06-30 MED ORDER — SODIUM CHLORIDE 0.9% FLUSH
10.0000 mL | INTRAVENOUS | Status: DC | PRN
  Administered 2020-06-30: 10 mL via INTRAVENOUS

## 2020-06-30 MED ORDER — HEPARIN SOD (PORK) LOCK FLUSH 100 UNIT/ML IV SOLN
500.0000 [IU] | Freq: Once | INTRAVENOUS | Status: AC
  Administered 2020-06-30: 500 [IU] via INTRAVENOUS

## 2020-06-30 NOTE — Progress Notes (Signed)
No treatment today.  Pt is going for a transplant. Will call pt with results of CBC.  Hemoglobin 7.9.  No treatment needed today.  Pt notified and verbalized understanding.

## 2020-06-30 NOTE — Progress Notes (Signed)
No treatment today since patient is going for transplant.

## 2020-06-30 NOTE — Progress Notes (Unsigned)
CRITICAL VALUE STICKER  CRITICAL VALUE:Absolute Neutrophils  0.2  RECEIVER (on-site recipient of call): Alferd Apa, LPN  DATE & TIME NOTIFIED: 06/30/20 11:48am  MESSENGER (representative from lab):  MD NOTIFIED: Dr. Delton Coombes  TIME OF NOTIFICATION: 11:52 am  RESPONSE:  MD made aware.  No new orders given at this time.

## 2020-06-30 NOTE — Patient Instructions (Signed)
[facility] at [facility] Discharge Instructions  You were seen and examined today by Dr. Katragadda. Please follow up as scheduled.    Thank you for choosing [facility] at [facility] to provide your oncology and hematology care.  To afford each patient quality time with our provider, please arrive at least 15 minutes before your scheduled appointment time.   If you have a lab appointment with the Cancer Center please come in thru the Main Entrance and check in at the main information desk.  You need to re-schedule your appointment should you arrive 10 or more minutes late.  We strive to give you quality time with our providers, and arriving late affects you and other patients whose appointments are after yours.  Also, if you no show three or more times for appointments you may be dismissed from the clinic at the providers discretion.     Again, thank you for choosing [facility].  Our hope is that these requests will decrease the amount of time that you wait before being seen by our physicians.       _____________________________________________________________  Should you have questions after your visit to  Cancer Center, please contact our office at [phone] and follow the prompts.  Our office hours are 8:00 a.m. and 4:30 p.m. Monday - Friday.  Please note that voicemails left after 4:00 p.m. may not be returned until the following business day.  We are closed weekends and major holidays.  You do have access to a nurse 24-7, just call the main number to the clinic [phone] and do not press any options, hold on the line and a nurse will answer the phone.    For prescription refill requests, have your pharmacy contact our office and allow 72 hours.    Due to Covid, you will need to wear a mask upon entering the hospital. If you do not have a mask, a mask will be given to you at the Main Entrance upon arrival. For  doctor visits, patients may have 1 support person age 18 or older with them. For treatment visits, patients can not have anyone with them due to social distancing guidelines and our immunocompromised population.      

## 2020-07-01 ENCOUNTER — Ambulatory Visit (HOSPITAL_COMMUNITY): Payer: 59

## 2020-07-02 ENCOUNTER — Ambulatory Visit (HOSPITAL_COMMUNITY): Payer: 59

## 2020-07-03 ENCOUNTER — Ambulatory Visit (HOSPITAL_COMMUNITY): Payer: 59

## 2020-07-04 ENCOUNTER — Ambulatory Visit (HOSPITAL_COMMUNITY): Payer: 59

## 2020-07-07 ENCOUNTER — Other Ambulatory Visit (HOSPITAL_COMMUNITY): Payer: 59

## 2020-07-07 ENCOUNTER — Ambulatory Visit (HOSPITAL_COMMUNITY): Payer: 59

## 2020-07-08 ENCOUNTER — Ambulatory Visit (HOSPITAL_COMMUNITY): Payer: 59

## 2021-09-23 ENCOUNTER — Encounter: Payer: Self-pay | Admitting: Podiatry

## 2021-09-23 ENCOUNTER — Ambulatory Visit (INDEPENDENT_AMBULATORY_CARE_PROVIDER_SITE_OTHER): Payer: 59 | Admitting: Podiatry

## 2021-09-23 DIAGNOSIS — L600 Ingrowing nail: Secondary | ICD-10-CM

## 2021-09-23 DIAGNOSIS — L03032 Cellulitis of left toe: Secondary | ICD-10-CM | POA: Diagnosis not present

## 2021-09-23 DIAGNOSIS — M722 Plantar fascial fibromatosis: Secondary | ICD-10-CM

## 2021-09-23 NOTE — Progress Notes (Signed)
Subjective:  ? ?Patient ID: Hunter Smith, male   DOB: 61 y.o.   MRN: [id number]  ? ?HPI ?Patient presents with a painful nailbed left hallux both medial and lateral side with long-term history of blood disorder and currently is in has MDS and has had a stem cell transplant.  His numbers are getting better and he has been cleared to have work done does not smoke tries to be active but not able to do much at the current time.  Also complains of chronic heel pain bilateral ? ? ?Review of Systems  ?All other systems reviewed and are negative. ? ? ?   ?Objective:  ?Physical Exam ?Vitals and nursing note reviewed.  ?Constitutional:   ?   Appearance: He is well-developed.  ?Pulmonary:  ?   Effort: Pulmonary effort is normal.  ?Musculoskeletal:     ?   General: Normal range of motion.  ?Skin: ?   General: Skin is warm.  ?Neurological:  ?   Mental Status: He is alert.  ?  ?Neurovascular status was found to be intact muscle strength was found to be adequate range of motion adequate.  Patient is noted to have incurvated left hallux that is affecting both the medial and lateral side of the toe and also has heel pain that is been chronic in nature bilateral and makes it hard for him to be active.  Patient does have good digital perfusion well oriented x3 with redness and necrotic tissue on each side of the nailbed ? ?   ?Assessment:  ?Paronychia infection x2 left hallux along with chronic Planter fasciitis bilateral ? ?   ?Plan:  ?H&P all conditions reviewed and discussed chronic ingrown toenail along with paronychia infection and fasciitis.  I do not want to do anything too aggressive I went ahead I anesthetized the left hallux 60 mg like Marcaine mixture sterile prep done and I remove the medial lateral borders create a channel for drainage to occur and flush the areas out and remove necrotic tissue and then went ahead and casted for functional orthotics to reduce the stress on his plantar heels and try to help with the chronic  heel pain he is experiencing ?   ? ? ?

## 2021-09-23 NOTE — Patient Instructions (Signed)

## 2021-10-14 ENCOUNTER — Ambulatory Visit (INDEPENDENT_AMBULATORY_CARE_PROVIDER_SITE_OTHER): Payer: 59

## 2021-10-14 DIAGNOSIS — M722 Plantar fascial fibromatosis: Secondary | ICD-10-CM | POA: Diagnosis not present

## 2021-10-14 NOTE — Progress Notes (Signed)
SITUATION: Reason for Visit: Fitting and Delivery of Custom Fabricated Foot Orthoses Patient Report: Patient reports comfort and is satisfied with device.  OBJECTIVE DATA: Patient History / Diagnosis:     ICD-10-CM   1. Plantar fasciitis  M72.2       Provided Device:  Custom Functional Foot Orthotics     RicheyLAB: [id number]  GOAL OF ORTHOSIS - Improve gait - Decrease energy expenditure - Improve Balance - Provide Triplanar stability of foot complex - Facilitate motion  ACTIONS PERFORMED Patient was fit with foot orthotics trimmed to shoe last. Patient tolerated fittign procedure.   Patient was provided with verbal and written instruction and demonstration regarding donning, doffing, wear, care, proper fit, function, purpose, cleaning, and use of the orthosis and in all related precautions and risks and benefits regarding the orthosis.  Patient was also provided with verbal instruction regarding how to report any failures or malfunctions of the orthosis and necessary follow up care. Patient was also instructed to contact our office regarding any change in status that may affect the function of the orthosis.  Patient demonstrated independence with proper donning, doffing, and fit and verbalized understanding of all instructions.  PLAN: Patient is to follow up in one week or as necessary (PRN). All questions were answered and concerns addressed. Plan of care was discussed with and agreed upon by the patient.

## 2021-11-30 ENCOUNTER — Other Ambulatory Visit: Payer: Self-pay

## 2021-12-16 ENCOUNTER — Other Ambulatory Visit: Payer: Self-pay

## 2022-01-18 ENCOUNTER — Encounter (HOSPITAL_COMMUNITY): Payer: Self-pay | Admitting: *Deleted

## 2022-01-18 ENCOUNTER — Other Ambulatory Visit: Payer: Self-pay

## 2022-01-18 ENCOUNTER — Emergency Department (HOSPITAL_COMMUNITY): Payer: 59

## 2022-01-18 ENCOUNTER — Inpatient Hospital Stay (HOSPITAL_COMMUNITY)
Admission: EM | Admit: 2022-01-18 | Discharge: 2022-01-25 | DRG: 871 | Disposition: A | Discharge status: Other Acute Inpatient Hospital | Payer: 59 | Source: Ambulatory Visit | Attending: Internal Medicine | Admitting: Internal Medicine

## 2022-01-18 DIAGNOSIS — J9601 Acute respiratory failure with hypoxia: Secondary | ICD-10-CM

## 2022-01-18 DIAGNOSIS — A419 Sepsis, unspecified organism: Secondary | ICD-10-CM | POA: Diagnosis present

## 2022-01-18 DIAGNOSIS — C9310 Chronic myelomonocytic leukemia not having achieved remission: Secondary | ICD-10-CM | POA: Diagnosis present

## 2022-01-18 DIAGNOSIS — E860 Dehydration: Secondary | ICD-10-CM | POA: Diagnosis present

## 2022-01-18 DIAGNOSIS — D61818 Other pancytopenia: Secondary | ICD-10-CM | POA: Diagnosis present

## 2022-01-18 DIAGNOSIS — Z87891 Personal history of nicotine dependence: Secondary | ICD-10-CM

## 2022-01-18 DIAGNOSIS — J189 Pneumonia, unspecified organism: Secondary | ICD-10-CM | POA: Diagnosis present

## 2022-01-18 DIAGNOSIS — J181 Lobar pneumonia, unspecified organism: Secondary | ICD-10-CM

## 2022-01-18 DIAGNOSIS — D46Z Other myelodysplastic syndromes: Secondary | ICD-10-CM | POA: Diagnosis not present

## 2022-01-18 DIAGNOSIS — Z20822 Contact with and (suspected) exposure to covid-19: Secondary | ICD-10-CM | POA: Diagnosis present

## 2022-01-18 DIAGNOSIS — Z9481 Bone marrow transplant status: Secondary | ICD-10-CM

## 2022-01-18 DIAGNOSIS — R6520 Severe sepsis without septic shock: Secondary | ICD-10-CM

## 2022-01-18 DIAGNOSIS — J80 Acute respiratory distress syndrome: Secondary | ICD-10-CM | POA: Diagnosis not present

## 2022-01-18 DIAGNOSIS — E876 Hypokalemia: Secondary | ICD-10-CM | POA: Diagnosis present

## 2022-01-18 DIAGNOSIS — R0602 Shortness of breath: Secondary | ICD-10-CM | POA: Diagnosis present

## 2022-01-18 DIAGNOSIS — Z79899 Other long term (current) drug therapy: Secondary | ICD-10-CM | POA: Diagnosis not present

## 2022-01-18 DIAGNOSIS — D849 Immunodeficiency, unspecified: Secondary | ICD-10-CM | POA: Diagnosis present

## 2022-01-18 DIAGNOSIS — D696 Thrombocytopenia, unspecified: Secondary | ICD-10-CM | POA: Diagnosis present

## 2022-01-18 DIAGNOSIS — C9311 Chronic myelomonocytic leukemia, in remission: Secondary | ICD-10-CM | POA: Diagnosis not present

## 2022-01-18 LAB — CBC WITH DIFFERENTIAL/PLATELET
Abs Immature Granulocytes: 0.51 10*3/uL — ABNORMAL HIGH (ref 0.00–0.07)
Basophils Absolute: 0 10*3/uL (ref 0.0–0.1)
Basophils Relative: 0 %
Eosinophils Absolute: 0.3 10*3/uL (ref 0.0–0.5)
Eosinophils Relative: 4 %
HCT: 39.3 % (ref 39.0–52.0)
Hemoglobin: 13.4 g/dL (ref 13.0–17.0)
Immature Granulocytes: 8 %
Lymphocytes Relative: 28 %
Lymphs Abs: 1.9 10*3/uL (ref 0.7–4.0)
MCH: 39.3 pg — ABNORMAL HIGH (ref 26.0–34.0)
MCHC: 34.1 g/dL (ref 30.0–36.0)
MCV: 115.2 fL — ABNORMAL HIGH (ref 80.0–100.0)
Monocytes Absolute: 0.8 10*3/uL (ref 0.1–1.0)
Monocytes Relative: 12 %
Neutro Abs: 3.1 10*3/uL (ref 1.7–7.7)
Neutrophils Relative %: 48 %
Platelets: 74 10*3/uL — ABNORMAL LOW (ref 150–400)
RBC: 3.41 MIL/uL — ABNORMAL LOW (ref 4.22–5.81)
RDW: 12.9 % (ref 11.5–15.5)
WBC: 6.6 10*3/uL (ref 4.0–10.5)
nRBC: 0 % (ref 0.0–0.2)

## 2022-01-18 LAB — CBC
HCT: 34.4 % — ABNORMAL LOW (ref 39.0–52.0)
Hemoglobin: 11.6 g/dL — ABNORMAL LOW (ref 13.0–17.0)
MCH: 39.3 pg — ABNORMAL HIGH (ref 26.0–34.0)
MCHC: 33.7 g/dL (ref 30.0–36.0)
MCV: 116.6 fL — ABNORMAL HIGH (ref 80.0–100.0)
Platelets: 62 10*3/uL — ABNORMAL LOW (ref 150–400)
RBC: 2.95 MIL/uL — ABNORMAL LOW (ref 4.22–5.81)
RDW: 13 % (ref 11.5–15.5)
WBC: 4.2 10*3/uL (ref 4.0–10.5)
nRBC: 0 % (ref 0.0–0.2)

## 2022-01-18 LAB — LACTIC ACID, PLASMA: Lactic Acid, Venous: 1.4 mmol/L (ref 0.5–1.9)

## 2022-01-18 LAB — SARS CORONAVIRUS 2 BY RT PCR: SARS Coronavirus 2 by RT PCR: NEGATIVE

## 2022-01-18 LAB — CREATININE, SERUM
Creatinine, Ser: 0.79 mg/dL (ref 0.61–1.24)
GFR, Estimated: >60 mL/min (ref >60)

## 2022-01-18 LAB — COMPREHENSIVE METABOLIC PANEL
ALT: 33 U/L (ref 0–44)
AST: 29 U/L (ref 15–41)
Albumin: 2.9 g/dL — ABNORMAL LOW (ref 3.5–5.0)
Alkaline Phosphatase: 86 U/L (ref 38–126)
Anion gap: 8 (ref 5–15)
BUN: 10 mg/dL (ref 6–20)
CO2: 27 mmol/L (ref 22–32)
Calcium: 8.6 mg/dL — ABNORMAL LOW (ref 8.9–10.3)
Chloride: 101 mmol/L (ref 98–111)
Creatinine, Ser: 0.7 mg/dL (ref 0.61–1.24)
GFR, Estimated: >60 mL/min (ref >60)
Glucose, Bld: 154 mg/dL — ABNORMAL HIGH (ref 70–99)
Potassium: 3.9 mmol/L (ref 3.5–5.1)
Sodium: 136 mmol/L (ref 135–145)
Total Bilirubin: 0.7 mg/dL (ref 0.3–1.2)
Total Protein: 5.7 g/dL — ABNORMAL LOW (ref 6.5–8.1)

## 2022-01-18 LAB — MAGNESIUM: Magnesium: 2 mg/dL (ref 1.7–2.4)

## 2022-01-18 MED ORDER — INFLUENZA VAC SPLIT QUAD 0.5 ML IM SUSY: 0.5000 mL | PREFILLED_SYRINGE | INTRAMUSCULAR | Status: DC | PRN

## 2022-01-18 MED ORDER — PROCHLORPERAZINE MALEATE 10 MG PO TABS: 10.0000 mg | ORAL_TABLET | Freq: Four times a day (QID) | ORAL | Status: DC | PRN

## 2022-01-18 MED ORDER — SODIUM CHLORIDE 0.9 % IV SOLN
1.0000 g | Freq: Once | INTRAVENOUS | Status: AC
  Administered 2022-01-18: 1 g via INTRAVENOUS
  Filled 2022-01-18: qty 10

## 2022-01-18 MED ORDER — FOLIC ACID 1 MG PO TABS
1.0000 mg | ORAL_TABLET | Freq: Every day | ORAL | Status: DC
  Administered 2022-01-18 – 2022-01-24 (×7): 1 mg via ORAL
  Filled 2022-01-18 (×7): qty 1

## 2022-01-18 MED ORDER — METOCLOPRAMIDE HCL 5 MG/ML IJ SOLN
10.0000 mg | Freq: Once | INTRAMUSCULAR | Status: AC
  Administered 2022-01-18: 10 mg via INTRAVENOUS
  Filled 2022-01-18: qty 2

## 2022-01-18 MED ORDER — LACTATED RINGERS IV BOLUS
1000.0000 mL | Freq: Once | INTRAVENOUS | Status: AC
  Administered 2022-01-18: 1000 mL via INTRAVENOUS

## 2022-01-18 MED ORDER — MELATONIN 3 MG PO TABS
6.0000 mg | ORAL_TABLET | Freq: Every day | ORAL | Status: DC
  Administered 2022-01-18 – 2022-01-24 (×6): 6 mg via ORAL
  Filled 2022-01-18 (×6): qty 2

## 2022-01-18 MED ORDER — IPRATROPIUM-ALBUTEROL 0.5-2.5 (3) MG/3ML IN SOLN
3.0000 mL | Freq: Once | RESPIRATORY_TRACT | Status: AC
  Administered 2022-01-18: 3 mL via RESPIRATORY_TRACT
  Filled 2022-01-18: qty 3

## 2022-01-18 MED ORDER — AZITHROMYCIN 250 MG PO TABS
500.0000 mg | ORAL_TABLET | Freq: Once | ORAL | Status: AC
  Administered 2022-01-18: 500 mg via ORAL
  Filled 2022-01-18: qty 2

## 2022-01-18 MED ORDER — ALBUTEROL SULFATE HFA 108 (90 BASE) MCG/ACT IN AERS: 1.0000 | INHALATION_SPRAY | Freq: Four times a day (QID) | RESPIRATORY_TRACT | Status: DC | PRN

## 2022-01-18 MED ORDER — ACETAMINOPHEN 325 MG PO TABS
650.0000 mg | ORAL_TABLET | Freq: Four times a day (QID) | ORAL | Status: DC | PRN
  Administered 2022-01-19 – 2022-01-20 (×2): 650 mg via ORAL
  Filled 2022-01-18 (×2): qty 2

## 2022-01-18 MED ORDER — PAROXETINE HCL ER 12.5 MG PO TB24
25.0000 mg | ORAL_TABLET | Freq: Every day | ORAL | Status: DC
  Administered 2022-01-19 – 2022-01-24 (×5): 25 mg via ORAL
  Filled 2022-01-18 (×5): qty 2
  Filled 2022-01-18 (×2): qty 1
  Filled 2022-01-18 (×4): qty 2

## 2022-01-18 MED ORDER — DOCUSATE SODIUM 100 MG PO CAPS
100.0000 mg | ORAL_CAPSULE | Freq: Two times a day (BID) | ORAL | Status: DC
  Administered 2022-01-19 – 2022-01-25 (×13): 100 mg via ORAL
  Filled 2022-01-18 (×14): qty 1

## 2022-01-18 MED ORDER — SODIUM CHLORIDE 0.9 % IV SOLN: INTRAVENOUS | Status: DC

## 2022-01-18 MED ORDER — KETOROLAC TROMETHAMINE 30 MG/ML IJ SOLN
15.0000 mg | Freq: Once | INTRAMUSCULAR | Status: AC
  Administered 2022-01-18: 15 mg via INTRAVENOUS
  Filled 2022-01-18: qty 1

## 2022-01-18 MED ORDER — URSODIOL 300 MG PO CAPS
600.0000 mg | ORAL_CAPSULE | Freq: Two times a day (BID) | ORAL | Status: DC
  Administered 2022-01-18 – 2022-01-25 (×14): 600 mg via ORAL
  Filled 2022-01-18 (×21): qty 2

## 2022-01-18 MED ORDER — PANTOPRAZOLE SODIUM 40 MG PO TBEC
40.0000 mg | DELAYED_RELEASE_TABLET | Freq: Two times a day (BID) | ORAL | Status: DC
  Administered 2022-01-18 – 2022-01-23 (×10): 40 mg via ORAL
  Filled 2022-01-18 (×10): qty 1

## 2022-01-18 MED ORDER — ENOXAPARIN SODIUM 40 MG/0.4ML IJ SOSY
40.0000 mg | PREFILLED_SYRINGE | INTRAMUSCULAR | Status: DC
  Filled 2022-01-18 (×4): qty 0.4

## 2022-01-18 MED ORDER — SODIUM CHLORIDE 0.9 % IV SOLN
500.0000 mg | INTRAVENOUS | Status: AC
  Administered 2022-01-19 – 2022-01-23 (×5): 500 mg via INTRAVENOUS
  Filled 2022-01-18 (×5): qty 5

## 2022-01-18 MED ORDER — FLUCONAZOLE 100 MG PO TABS: 200.0000 mg | ORAL_TABLET | Freq: Every day | ORAL | Status: DC

## 2022-01-18 MED ORDER — TIZANIDINE HCL 2 MG PO TABS
4.0000 mg | ORAL_TABLET | Freq: Every day | ORAL | Status: DC
  Administered 2022-01-18 – 2022-01-24 (×7): 4 mg via ORAL
  Filled 2022-01-18: qty 1
  Filled 2022-01-18 (×2): qty 2
  Filled 2022-01-18 (×2): qty 1
  Filled 2022-01-18 (×2): qty 2

## 2022-01-18 MED ORDER — SODIUM CHLORIDE 0.9 % IV SOLN: 2.0000 g | INTRAVENOUS | Status: DC

## 2022-01-18 MED ORDER — BENZONATATE 100 MG PO CAPS
200.0000 mg | ORAL_CAPSULE | Freq: Once | ORAL | Status: AC
  Administered 2022-01-18: 200 mg via ORAL
  Filled 2022-01-18: qty 2

## 2022-01-18 MED ORDER — ACETAMINOPHEN 650 MG RE SUPP: 650.0000 mg | Freq: Four times a day (QID) | RECTAL | Status: DC | PRN

## 2022-01-18 MED ORDER — ONDANSETRON HCL 4 MG/2ML IJ SOLN
4.0000 mg | Freq: Four times a day (QID) | INTRAMUSCULAR | Status: DC | PRN
  Administered 2022-01-19 – 2022-01-24 (×5): 4 mg via INTRAVENOUS
  Filled 2022-01-18 (×6): qty 2

## 2022-01-18 MED ORDER — ONDANSETRON HCL 4 MG PO TABS: 4.0000 mg | ORAL_TABLET | Freq: Four times a day (QID) | ORAL | Status: DC | PRN

## 2022-01-18 MED ORDER — ACYCLOVIR 800 MG PO TABS
800.0000 mg | ORAL_TABLET | Freq: Two times a day (BID) | ORAL | Status: DC
  Administered 2022-01-18 – 2022-01-25 (×15): 800 mg via ORAL
  Filled 2022-01-18 (×15): qty 1

## 2022-01-18 NOTE — ED Triage Notes (Signed)
Pt was being seen at Dr. Nevada Crane and was sent here due to low O2 sats in the mid 80's  Pt c/o headache x one week with cough

## 2022-01-18 NOTE — ED Notes (Signed)
Pt wants to wait until he is in a room and wants the blood drawn from his port.

## 2022-01-18 NOTE — ED Notes (Signed)
SPO2 84% on RA prior to ambulating. Did not ambulate. Placed back on O2 [place] 3L. EDPA notified.

## 2022-01-18 NOTE — ED Provider Notes (Signed)
[facility] Provider Note   CSN: [id number] Arrival date & time: 01/18/22  1246     History  Chief Complaint  Patient presents with   Headache    Hunter Smith is a 61 y.o. male.   Headache Associated symptoms: cough and vomiting   Associated symptoms: no abdominal pain, no diarrhea, no fever, no numbness and no weakness        Hunter Smith is a 60 y.o. male with past medical history of microcytic anemia, myelodysplastic syndrome and chronic myelomonocytic leukemia who presents to the Emergency Department from PCPs office for evaluation of low O2 sats and headache.  He notes having coughing with occasional posttussive emesis x1 week.  Describes diffuse waxing and waning headache for 1 week as well.  Times headache can be unilateral and other times headache is diffuse.  Minimal relief with Tylenol.  He had negative COVID test at PCPs office, he was noted to have O2 sat in the mid to upper 80s and sent here for further evaluation.  He denies history of supplemental oxygen requirement at baseline.  Cough has been mostly nonproductive. He denies neck pain or stiffness, persistent fever, numbness or weakness, and vision changes  Home Medications Prior to Admission medications   Medication Sig Start Date End Date Taking? Authorizing Provider  acyclovir (ZOVIRAX) 800 MG tablet Take 800 mg by mouth 2 (two) times daily. 08/03/21  Yes [provider]  albuterol (VENTOLIN HFA) 108 (90 Base) MCG/ACT inhaler Inhale 1 puff into the lungs every 6 (six) hours as needed for wheezing or shortness of breath.   Yes [provider]  amoxicillin (AMOXIL) 500 MG capsule Take 1 capsule (500 mg total) by mouth 3 (three) times daily. 05/12/20  Yes Derek Jack, MD  b complex vitamins capsule Take 1 capsule by mouth daily.   Yes [provider]  budesonide (ENTOCORT EC) 3 MG 24 hr capsule Take 9 mg by mouth daily. 09/17/21  Yes [provider]   Calcium Carb-Cholecalciferol (CALCIUM/VITAMIN D PO) Take by mouth.   Yes [provider]  docusate sodium (COLACE) 100 MG capsule Take 100 mg by mouth 2 (two) times daily.   Yes [provider]  folic acid (FOLVITE) 1 MG tablet Take 1 mg by mouth at bedtime. 08/17/21  Yes [provider]  MAGNESIUM GLYCINATE PO Take 1 tablet by mouth in the morning and at bedtime.   Yes [provider]  Melatonin 5 MG CAPS Take 5 mg by mouth daily.   Yes [provider]  Multiple Vitamin (MULTIVITAMIN) tablet Take 1 tablet by mouth daily.   Yes [provider]  ondansetron (ZOFRAN) 8 MG tablet Take 8 mg by mouth every 8 (eight) hours as needed for nausea or vomiting.   Yes [provider]  ondansetron (ZOFRAN-ODT) 8 MG disintegrating tablet Take 8 mg by mouth every 8 (eight) hours as needed for nausea or vomiting.   Yes [provider]  pantoprazole (PROTONIX) 40 MG tablet Take 1 tablet by mouth 2 (two) times daily. 08/10/21  Yes [provider]  PARoxetine (PAXIL-CR) 25 MG 24 hr tablet Take 1 tablet by mouth daily. 08/18/21  Yes [provider]  prochlorperazine (COMPAZINE) 10 MG tablet Take 1 tablet (10 mg total) by mouth every 6 (six) hours as needed (Nausea or vomiting). 12/31/19  Yes Derek Jack, MD  tiZANidine (ZANAFLEX) 4 MG tablet Take 4 mg by mouth at bedtime. 11/24/21  Yes [provider]  ursodiol (ACTIGALL) 300 MG capsule Take 600 mg by mouth 2 (two) times daily. 01/14/22  Yes [provider]  azaCITIDine 5 mg/2 mLs in lactated ringers infusion Inject into the vein daily. Days 1-7 every 28 days Patient not taking: Reported on 01/18/2022 12/31/19   [provider]  fluconazole (DIFLUCAN) 200 MG tablet Take by mouth. 09/01/21   [provider]  lidocaine-prilocaine (EMLA) cream Apply a small amount to port a cath site and cover with plastic wrap 1 hour prior to chemotherapy  appointments 12/28/19   Derek Jack, MD  penicillin v potassium (VEETID) 500 MG tablet Take 1 tablet by mouth 2 (two) times daily. 11/30/21   [provider]      Allergies    Patient has no known allergies.    Review of Systems   Review of Systems  Constitutional:  Positive for appetite change. Negative for chills and fever.  HENT:  Negative for trouble swallowing.   Respiratory:  Positive for cough and shortness of breath.   Cardiovascular:  Negative for chest pain and leg swelling.  Gastrointestinal:  Positive for vomiting. Negative for abdominal pain and diarrhea.  Neurological:  Positive for headaches. Negative for weakness and numbness.    Physical Exam Updated Vital Signs BP 111/71 (BP Location: Left Arm)   Pulse 87   Temp 97.9 F (36.6 C) (Oral)   Resp 19   Ht '5\' 11"'$  (1.803 m)   Wt 83.5 kg   SpO2 98%   BMI 25.66 kg/m  Physical Exam Vitals and nursing note reviewed.  Constitutional:      Appearance: He is ill-appearing.  HENT:     Mouth/Throat:     Mouth: Mucous membranes are moist.  Cardiovascular:     Rate and Rhythm: Normal rate and regular rhythm.     Pulses: Normal pulses.  Pulmonary:     Effort: Pulmonary effort is normal.     Breath sounds: No wheezing or rales.  Musculoskeletal:     Cervical back: Normal range of motion and neck supple.     Right lower leg: No edema.     Left lower leg: No edema.  Skin:    General: Skin is warm.     Capillary Refill: Capillary refill takes less than 2 seconds.     Findings: No rash.  Neurological:     General: No focal deficit present.     Mental Status: He is alert.     Sensory: No sensory deficit.     Motor: No weakness.     ED Results / Procedures / Treatments   Labs (all labs ordered are listed, but only abnormal results are displayed) Labs Reviewed  CBC WITH DIFFERENTIAL/PLATELET - Abnormal; Notable for the following components:      Result Value   RBC 3.41 (*)    MCV 115.2 (*)     MCH 39.3 (*)    Platelets 74 (*)    Abs Immature Granulocytes 0.51 (*)    All other components within normal limits  COMPREHENSIVE METABOLIC PANEL - Abnormal; Notable for the following components:   Glucose, Bld 154 (*)    Calcium 8.6 (*)    Total Protein 5.7 (*)    Albumin 2.9 (*)    All other components within normal limits  SARS CORONAVIRUS 2 BY RT PCR  CULTURE, BLOOD (SINGLE)  LACTIC ACID, PLASMA  URINALYSIS, ROUTINE W REFLEX MICROSCOPIC    EKG EKG Interpretation  Date/Time:  Monday January 18 2022 13:10:18 EDT  Ventricular Rate:  126 PR Interval:  146 QRS Duration: 70 QT Interval:  292 QTC Calculation: 422 R Axis:   68 Text Interpretation: Sinus tachycardia Possible Left atrial enlargement ST & T wave abnormality, consider lateral ischemia Abnormal ECG No previous ECGs available Confirmed by Octaviano Glow [phone]) on 01/18/2022 6:18:47 PM  Radiology DG Chest 2 View  Result Date: 01/18/2022 CLINICAL DATA:  CMML, MDS, shortness of breath with cough and wheezing EXAM: CHEST - 2 VIEW COMPARISON:  02/16/2020 FINDINGS: Right subclavian power port catheter tip lower SVC level as before. Lower lung volumes with new bilateral mixed interstitial patchy airspace opacities worse in the upper lobes concerning for pneumonia, including atypical pneumonia. No large effusion or pneumothorax. Trachea midline. Stable mild cardiac enlargement. Degenerative changes noted spine. IMPRESSION: Bilateral mixed interstitial and airspace opacities worse in the upper lobes compatible with pneumonia. See above comment. Electronically Signed   By: Jerilynn Mages.  Shick M.D.   On: 01/18/2022 13:37    Procedures Procedures    Medications Ordered in ED Medications  cefTRIAXone (ROCEPHIN) 1 g in sodium chloride 0.9 % 100 mL IVPB (0 g Intravenous Stopped 01/18/22 1715)  azithromycin (ZITHROMAX) tablet 500 mg (500 mg Oral Given 01/18/22 1631)  benzonatate (TESSALON) capsule 200 mg (200 mg Oral Given 01/18/22 1631)   ketorolac (TORADOL) 30 MG/ML injection 15 mg (15 mg Intravenous Given 01/18/22 1629)  metoCLOPramide (REGLAN) injection 10 mg (10 mg Intravenous Given 01/18/22 1625)  ipratropium-albuterol (DUONEB) 0.5-2.5 (3) MG/3ML nebulizer solution 3 mL (3 mLs Nebulization Given 01/18/22 1635)    ED Course/ Medical Decision Making/ A&P Clinical Course as of 01/18/22 1913  Mon Jan 18, 2022  1533 SOB new CAP sent in by PCP  [CC]  [id number] 61 year old male with history of stem cell transplant presenting to ED with shortness of breath.  His work-up today was notable for bilateral pneumonia.  At rest the patient is relatively stable and has no increased work of breathing, is comfortable, but with any exertion or movement he does become hypoxic dropping as low as 84%.  He is requiring supplemental oxygen.  He was also tachycardic and had a borderline fever on arrival.  He will be admitted for pneumonia, antibiotics ordered appropriately. [MT]    Clinical Course User Index [CC] Tretha Sciara, MD [MT] Langston Masker Carola Rhine, MD                           Medical Decision Making Patient comes from PCPs office for evaluation of cough and hypoxia.  He reports mostly nonproductive cough and diffuse headache for 1 week.  Headache symptoms have been waxing and waning.  He was noted to be hypoxic with O2 sat in the 80s at PCPs office.  Similar readings here on arrival.  He was placed on 2 L oxygen by nasal cannula.  He has maintained sats in the 90s while on 2 L.   On exam, patient resting comfortably.  Initial vital signs show tachycardia that improved after being placed on O2.  Low-grade fever on arrival.  No active vomiting.  Work-up will include labs, chest x-ray and COVID test.  Differential diagnosis at this time would include COVID, pneumonia, sepsis, viral illness, PE  Amount and/or Complexity of Data Reviewed Labs: ordered.    Details: Labs interpreted by me, white count today 6.6, leukopenic 1 year ago.  No recent  labs available for comparison.  Chemistries show blood sugar of 154.  Kidney functions reassuring.  Lactic acid 1.4 COVID swab negative. Radiology: ordered.    Details: Chest x-ray shows bilateral upper lobe pneumonia ECG/medicine tests: ordered.    Details: EKG shows sinus tachycardia Discussion of management or test interpretation with external provider(s): Patient here with non-COVID related pneumonia.  Lactic acid reassuring.  O2 sat in the mid to upper 90s on 2 L.  Tachycardia improved.  Community-acquired pneumonia antibiotics given here.  Patient also given DuoNeb treatment and reports feeling better.  Attempted ambulation in the department with pulse ox and O2 dropped to 84%.  Patient agreeable to hospital admission.  Discussed findings with [facility] hospitalist, Dr. Doristine Bosworth who agrees to admit.  Risk Prescription drug management.           Final Clinical Impression(s) / ED Diagnoses Final diagnoses:  Community acquired pneumonia, unspecified laterality    Rx / DC Orders ED Discharge Orders     None         Kem Parkinson, PA-C 01/18/22 1925    Tretha Sciara, MD 01/19/22 [phone]

## 2022-01-18 NOTE — H&P (Signed)
History and Physical    Hunter Smith QIW:[id number] DOB: 1960/09/25 DOA: 01/18/2022  PCP: Patient, No Pcp Per  Patient coming from: Home/PCP office  I have personally briefly reviewed patient's old medical records in [facility]  Chief Complaint: Short of breath  HPI: Hunter Smith is a 61 y.o. male with medical history significant of microcytic anemia, myelodysplastic syndrome and chronic myelomonocytic leukemia who initially went to see his PCP due to cough and shortness of breath which has been ongoing for about a week.  At PCPs office, he was hypoxic so he was sent to the emergency department.  Per patient, he has been having cough but no sputum production.  Some shortness of breath, more with exertion.  Some headache due to coughing.  No fever, chills, sweating, nausea, vomiting, any problem with urination or with bowel movements.  No recent travel but he recalls to have his granddaughter 11-year-old and 91 year old being sick lately.  Patient was tested negative for COVID in the PCPs office.  ED Course: Upon arrival to ED, patient was tachycardic, tachypneic and hypoxic requiring 3 L of oxygen.  Chest x-ray shows bilateral pneumonia.  He was started on antibiotics.  Patient meets criteria for sepsis.  Patient was not given any IV fluids.  CBC also indicates hemoconcentration as normally he is pancytopenic but currently all his numbers are higher.  Hospital service were consulted to admit the patient for further management.  I requested ED provider to provide patient with IV fluid bolus per sepsis protocol.  He was tested negative for COVID again here.  Review of Systems: As per HPI otherwise negative.    Past Medical History:  Diagnosis Date   Cancer [facility])    Port-A-Cath in place 12/28/2019    Past Surgical History:  Procedure Laterality Date   COLONOSCOPY     PORTACATH PLACEMENT Right 01/18/2020   Procedure: INSERTION PORT-A-CATH;  Surgeon: Aviva Signs, MD;  Location: [facility];   Service: General;  Laterality: Right;     reports that he quit smoking about 33 years ago. His smoking use included cigarettes. He has never used smokeless tobacco. He reports current alcohol use of about 1.0 - 2.0 standard drink of alcohol per week. He reports that he does not use drugs.  No Known Allergies  Family History  Problem Relation Age of Onset   Bladder Cancer Father    Scoliosis Sister     Prior to Admission medications   Medication Sig Start Date End Date Taking? Authorizing Provider  acyclovir (ZOVIRAX) 800 MG tablet Take 800 mg by mouth 2 (two) times daily. 08/03/21  Yes [provider]  albuterol (VENTOLIN HFA) 108 (90 Base) MCG/ACT inhaler Inhale 1 puff into the lungs every 6 (six) hours as needed for wheezing or shortness of breath.   Yes [provider]  amoxicillin (AMOXIL) 500 MG capsule Take 1 capsule (500 mg total) by mouth 3 (three) times daily. 05/12/20  Yes Derek Jack, MD  b complex vitamins capsule Take 1 capsule by mouth daily.   Yes [provider]  budesonide (ENTOCORT EC) 3 MG 24 hr capsule Take 9 mg by mouth daily. 09/17/21  Yes [provider]  Calcium Carb-Cholecalciferol (CALCIUM/VITAMIN D PO) Take by mouth.   Yes [provider]  docusate sodium (COLACE) 100 MG capsule Take 100 mg by mouth 2 (two) times daily.   Yes [provider]  folic acid (FOLVITE) 1 MG tablet Take 1 mg by mouth at bedtime. 08/17/21  Yes [provider]  MAGNESIUM GLYCINATE PO Take 1 tablet by mouth in the morning and at bedtime.   Yes [provider]  Melatonin 5 MG CAPS Take 5 mg by mouth daily.   Yes [provider]  Multiple Vitamin (MULTIVITAMIN) tablet Take 1 tablet by mouth daily.   Yes [provider]  ondansetron (ZOFRAN) 8 MG tablet Take 8 mg by mouth every 8 (eight) hours as needed for nausea or vomiting.   Yes [provider]  ondansetron (ZOFRAN-ODT) 8 MG  disintegrating tablet Take 8 mg by mouth every 8 (eight) hours as needed for nausea or vomiting.   Yes [provider]  pantoprazole (PROTONIX) 40 MG tablet Take 1 tablet by mouth 2 (two) times daily. 08/10/21  Yes [provider]  PARoxetine (PAXIL-CR) 25 MG 24 hr tablet Take 1 tablet by mouth daily. 08/18/21  Yes [provider]  prochlorperazine (COMPAZINE) 10 MG tablet Take 1 tablet (10 mg total) by mouth every 6 (six) hours as needed (Nausea or vomiting). 12/31/19  Yes Derek Jack, MD  tiZANidine (ZANAFLEX) 4 MG tablet Take 4 mg by mouth at bedtime. 11/24/21  Yes [provider]  ursodiol (ACTIGALL) 300 MG capsule Take 600 mg by mouth 2 (two) times daily. 01/14/22  Yes [provider]  azaCITIDine 5 mg/2 mLs in lactated ringers infusion Inject into the vein daily. Days 1-7 every 28 days Patient not taking: Reported on 01/18/2022 12/31/19   [provider]  fluconazole (DIFLUCAN) 200 MG tablet Take by mouth. 09/01/21   [provider]  lidocaine-prilocaine (EMLA) cream Apply a small amount to port a cath site and cover with plastic wrap 1 hour prior to chemotherapy appointments 12/28/19   Derek Jack, MD  penicillin v potassium (VEETID) 500 MG tablet Take 1 tablet by mouth 2 (two) times daily. 11/30/21   [provider]    Physical Exam: Vitals:   01/18/22 1822 01/18/22 1823 01/18/22 1827 01/18/22 1830  BP:    111/71  Pulse: 90 86 98 87  Resp: (!) 28 (!) 30 (!) 25 19  Temp:    97.9 F (36.6 C)  TempSrc:    Oral  SpO2: (!) 88% (!) 85% (!) 84% 98%  Weight:      Height:        Constitutional: NAD, calm, comfortable Vitals:   01/18/22 1822 01/18/22 1823 01/18/22 1827 01/18/22 1830  BP:    111/71  Pulse: 90 86 98 87  Resp: (!) 28 (!) 30 (!) 25 19  Temp:    97.9 F (36.6 C)  TempSrc:    Oral  SpO2: (!) 88% (!) 85% (!) 84% 98%  Weight:      Height:       Eyes: PERRL, lids and conjunctivae normal ENMT:  Mucous membranes are moist. Posterior pharynx clear of any exudate or lesions.Normal dentition.  Neck: normal, supple, no masses, no thyromegaly Respiratory: clear to auscultation bilaterally, no wheezing, no crackles. Normal respiratory effort. No accessory muscle use.  Cardiovascular: Regular rate and rhythm, no murmurs / rubs / gallops. No extremity edema. 2+ pedal pulses. No carotid bruits.  Abdomen: no tenderness, no masses palpated. No hepatosplenomegaly. Bowel sounds positive.  Musculoskeletal: no clubbing / cyanosis. No joint deformity upper and lower extremities. Good ROM, no contractures. Normal muscle tone.  Skin: no rashes, lesions, ulcers. No induration Neurologic: CN 2-12 grossly intact. Sensation intact, DTR normal. Strength 5/5 in all 4.  Psychiatric: Normal judgment and insight.  Alert and oriented x 3. Normal mood.    Labs on Admission: I have personally reviewed following labs and imaging studies  CBC: Recent Labs  Lab 01/18/22 1405  WBC 6.6  NEUTROABS 3.1  HGB 13.4  HCT 39.3  MCV 115.2*  PLT 74*   Basic Metabolic Panel: Recent Labs  Lab 01/18/22 1405  NA 136  K 3.9  CL 101  CO2 27  GLUCOSE 154*  BUN 10  CREATININE 0.70  CALCIUM 8.6*   GFR: Estimated Creatinine Clearance: 105.9 mL/min (by C-G formula based on SCr of 0.7 mg/dL). Liver Function Tests: Recent Labs  Lab 01/18/22 1405  AST 29  ALT 33  ALKPHOS 86  BILITOT 0.7  PROT 5.7*  ALBUMIN 2.9*   No results for input(s): "LIPASE", "AMYLASE" in the last 168 hours. No results for input(s): "AMMONIA" in the last 168 hours. Coagulation Profile: No results for input(s): "INR", "PROTIME" in the last 168 hours. Cardiac Enzymes: No results for input(s): "CKTOTAL", "CKMB", "CKMBINDEX", "TROPONINI" in the last 168 hours. BNP (last 3 results) No results for input(s): "PROBNP" in the last 8760 hours. HbA1C: No results for input(s): "HGBA1C" in the last 72 hours. CBG: No results for input(s): "GLUCAP"  in the last 168 hours. Lipid Profile: No results for input(s): "CHOL", "HDL", "LDLCALC", "TRIG", "CHOLHDL", "LDLDIRECT" in the last 72 hours. Thyroid Function Tests: No results for input(s): "TSH", "T4TOTAL", "FREET4", "T3FREE", "THYROIDAB" in the last 72 hours. Anemia Panel: No results for input(s): "VITAMINB12", "FOLATE", "FERRITIN", "TIBC", "IRON", "RETICCTPCT" in the last 72 hours. Urine analysis:    Component Value Date/Time   COLORURINE YELLOW 02/16/2020 0230   APPEARANCEUR CLEAR 02/16/2020 0230   LABSPEC 1.011 02/16/2020 0230   PHURINE 5.0 02/16/2020 0230   GLUCOSEU NEGATIVE 02/16/2020 0230   HGBUR SMALL (A) 02/16/2020 0230   BILIRUBINUR NEGATIVE 02/16/2020 0230   KETONESUR NEGATIVE 02/16/2020 0230   PROTEINUR NEGATIVE 02/16/2020 0230   NITRITE NEGATIVE 02/16/2020 0230   LEUKOCYTESUR NEGATIVE 02/16/2020 0230    Radiological Exams on Admission: DG Chest 2 View  Result Date: 01/18/2022 CLINICAL DATA:  CMML, MDS, shortness of breath with cough and wheezing EXAM: CHEST - 2 VIEW COMPARISON:  02/16/2020 FINDINGS: Right subclavian power port catheter tip lower SVC level as before. Lower lung volumes with new bilateral mixed interstitial patchy airspace opacities worse in the upper lobes concerning for pneumonia, including atypical pneumonia. No large effusion or pneumothorax. Trachea midline. Stable mild cardiac enlargement. Degenerative changes noted spine. IMPRESSION: Bilateral mixed interstitial and airspace opacities worse in the upper lobes compatible with pneumonia. See above comment. Electronically Signed   By: Jerilynn Mages.  Shick M.D.   On: 01/18/2022 13:37    EKG: Independently reviewed.  Sinus tachycardia, nonspecific ST-T wave changes.  Assessment/Plan Principal Problem:   CAP (community acquired pneumonia) Active Problems:   Thrombocytopenia ([place])   Myelodysplastic syndrome, high grade (HCC)   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HCC)   Severe sepsis (HCC)   Acute  respiratory failure with hypoxia (HCC)   Severe sepsis as well as acute hypoxic respiratory failure secondary to community-acquired pneumonia, POA: Meets SIRS.  Sepsis criteria based on tachycardia and tachypnea.  Lactic acid normal.  He received Rocephin and Zithromax in the ED which I will continue.  ED physician to start on IV fluid bolus per sepsis protocol and then we will continue him on normal saline at 125 cc/h.  Blood cultures have been drawn.  I will order sputum culture, urine antigen for Legionella and streptococci.  Check procalcitonin.  History of myelodysplastic syndrome/CMML: I will resume his acyclovir.  Unsure why he is on amoxicillin so I will hold that.  He is not on any chemotherapy at the moment.  Dehydration: Patient clinically appears dehydrated and CBC also indicates that.  Hydrate as mentioned above.  DVT prophylaxis: enoxaparin (LOVENOX) injection 40 mg Start: 01/18/22 1900 Code Status: Full code Family Communication: Wife present at bedside.  Plan of care discussed with patient in length and he verbalized understanding and agreed with it. Disposition Plan: Home in about 2 to 3 days Consults called: None  Darliss Cheney MD [facility]  *Please note that this is a verbal dictation therefore any spelling or grammatical errors are due to the "[place] [employer]" system interpretation.  Please page via [place] and do not message via secure chat for urgent patient care matters. Secure chat can be used for non urgent patient care matters. 01/18/2022, 7:02 PM  To contact the attending provider between 7A-7P or the covering provider during after hours 7P-7A, please log into the web site [email or web address]

## 2022-01-19 ENCOUNTER — Inpatient Hospital Stay (HOSPITAL_COMMUNITY): Payer: 59

## 2022-01-19 DIAGNOSIS — D46Z Other myelodysplastic syndromes: Secondary | ICD-10-CM

## 2022-01-19 DIAGNOSIS — J181 Lobar pneumonia, unspecified organism: Secondary | ICD-10-CM | POA: Diagnosis not present

## 2022-01-19 DIAGNOSIS — J9601 Acute respiratory failure with hypoxia: Secondary | ICD-10-CM | POA: Diagnosis not present

## 2022-01-19 DIAGNOSIS — C9311 Chronic myelomonocytic leukemia, in remission: Secondary | ICD-10-CM | POA: Diagnosis not present

## 2022-01-19 DIAGNOSIS — A419 Sepsis, unspecified organism: Principal | ICD-10-CM

## 2022-01-19 DIAGNOSIS — R6520 Severe sepsis without septic shock: Secondary | ICD-10-CM

## 2022-01-19 LAB — VITAMIN B12: Vitamin B-12: 827 pg/mL (ref 180–914)

## 2022-01-19 LAB — URINALYSIS, ROUTINE W REFLEX MICROSCOPIC
Bilirubin Urine: NEGATIVE
Glucose, UA: NEGATIVE mg/dL
Hgb urine dipstick: NEGATIVE
Ketones, ur: NEGATIVE mg/dL
Leukocytes,Ua: NEGATIVE
Nitrite: NEGATIVE
Protein, ur: NEGATIVE mg/dL
Specific Gravity, Urine: 1.014 (ref 1.005–1.030)
pH: 6 (ref 5.0–8.0)

## 2022-01-19 LAB — CBC
HCT: 38.2 % — ABNORMAL LOW (ref 39.0–52.0)
Hemoglobin: 12.7 g/dL — ABNORMAL LOW (ref 13.0–17.0)
MCH: 39 pg — ABNORMAL HIGH (ref 26.0–34.0)
MCHC: 33.2 g/dL (ref 30.0–36.0)
MCV: 117.2 fL — ABNORMAL HIGH (ref 80.0–100.0)
Platelets: 64 10*3/uL — ABNORMAL LOW (ref 150–400)
RBC: 3.26 MIL/uL — ABNORMAL LOW (ref 4.22–5.81)
RDW: 12.9 % (ref 11.5–15.5)
WBC: 4.6 10*3/uL (ref 4.0–10.5)
nRBC: 0 % (ref 0.0–0.2)

## 2022-01-19 LAB — COMPREHENSIVE METABOLIC PANEL
ALT: 29 U/L (ref 0–44)
AST: 23 U/L (ref 15–41)
Albumin: 2.5 g/dL — ABNORMAL LOW (ref 3.5–5.0)
Alkaline Phosphatase: 78 U/L (ref 38–126)
Anion gap: 5 (ref 5–15)
BUN: 11 mg/dL (ref 6–20)
CO2: 31 mmol/L (ref 22–32)
Calcium: 8.6 mg/dL — ABNORMAL LOW (ref 8.9–10.3)
Chloride: 106 mmol/L (ref 98–111)
Creatinine, Ser: 0.68 mg/dL (ref 0.61–1.24)
GFR, Estimated: >60 mL/min (ref >60)
Glucose, Bld: 152 mg/dL — ABNORMAL HIGH (ref 70–99)
Potassium: 4.1 mmol/L (ref 3.5–5.1)
Sodium: 142 mmol/L (ref 135–145)
Total Bilirubin: 0.4 mg/dL (ref 0.3–1.2)
Total Protein: 5.1 g/dL — ABNORMAL LOW (ref 6.5–8.1)

## 2022-01-19 LAB — FOLATE: Folate: 18.1 ng/mL (ref >5.9)

## 2022-01-19 LAB — STREP PNEUMONIAE URINARY ANTIGEN: Strep Pneumo Urinary Antigen: NEGATIVE

## 2022-01-19 LAB — CORTISOL-AM, BLOOD: Cortisol - AM: 14.4 ug/dL (ref 6.7–22.6)

## 2022-01-19 LAB — PROTIME-INR
INR: 1.1 (ref 0.8–1.2)
Prothrombin Time: 14.3 seconds (ref 11.4–15.2)

## 2022-01-19 LAB — HIV ANTIBODY (ROUTINE TESTING W REFLEX): HIV Screen 4th Generation wRfx: NONREACTIVE

## 2022-01-19 LAB — MRSA NEXT GEN BY PCR, NASAL: MRSA by PCR Next Gen: NOT DETECTED

## 2022-01-19 LAB — PROCALCITONIN: Procalcitonin: 0.13 ng/mL

## 2022-01-19 LAB — D-DIMER, QUANTITATIVE: D-Dimer, Quant: 0.68 ug/mL-FEU — ABNORMAL HIGH (ref 0.00–0.50)

## 2022-01-19 MED ORDER — LACTATED RINGERS IV SOLN: INTRAVENOUS | Status: AC

## 2022-01-19 MED ORDER — IOHEXOL 350 MG/ML SOLN
100.0000 mL | Freq: Once | INTRAVENOUS | Status: AC | PRN
  Administered 2022-01-19: 75 mL via INTRAVENOUS

## 2022-01-19 MED ORDER — SODIUM CHLORIDE 0.9 % IV SOLN
2.0000 g | Freq: Three times a day (TID) | INTRAVENOUS | Status: DC
  Administered 2022-01-19 – 2022-01-25 (×19): 2 g via INTRAVENOUS
  Filled 2022-01-19 (×19): qty 12.5

## 2022-01-19 MED ORDER — GUAIFENESIN-DM 100-10 MG/5ML PO SYRP
5.0000 mL | ORAL_SOLUTION | ORAL | Status: DC | PRN
  Administered 2022-01-19 – 2022-01-23 (×3): 5 mL via ORAL
  Filled 2022-01-19 (×3): qty 5

## 2022-01-19 MED ORDER — SODIUM CHLORIDE 0.9 % IV SOLN
2.0000 g | Freq: Two times a day (BID) | INTRAVENOUS | Status: DC
  Administered 2022-01-19: 2 g via INTRAVENOUS
  Filled 2022-01-19: qty 12.5

## 2022-01-19 MED ORDER — HYDROCODONE-ACETAMINOPHEN 5-325 MG PO TABS
1.0000 | ORAL_TABLET | Freq: Four times a day (QID) | ORAL | Status: DC | PRN
  Administered 2022-01-19 – 2022-01-24 (×11): 1 via ORAL
  Filled 2022-01-19 (×11): qty 1

## 2022-01-19 NOTE — TOC Progression Note (Signed)
Transition of Care [facility]) - Progression Note    Patient Details  Name: Hunter Smith MRN: [id number] Date of Birth: July 03, 1960  Transition of Care [facility]) CM/SW Contact  Salome Arnt, [place] Phone Number: 01/19/2022, 10:11 AM  Clinical Narrative:   Transition of Care (TOC) Screening Note   Patient Details  Name: Hunter Smith Date of Birth: December 14, 1960   Transition of Care (TOC) CM/SW Contact:    Salome Arnt, [place] Phone Number: 01/19/2022, 10:11 AM    Transition of Care Department [facility]) has reviewed patient and no TOC needs have been identified at this time. We will continue to monitor patient advancement through interdisciplinary progression rounds. If new patient transition needs arise, please place a TOC consult.            Expected Discharge Plan and Services                                                 Social Determinants of Health (SDOH) Interventions    Readmission Risk Interventions     No data to display

## 2022-01-19 NOTE — Assessment & Plan Note (Signed)
Given the patient's immunocompromise state and frequent hospitalizations--bone antibiotic coverage -Start cefepime -Azithromycin -MRSA screen Follow blood cultures

## 2022-01-19 NOTE — Assessment & Plan Note (Addendum)
-  This has been chronic related to his CMML -No signs of bleeding -Continue to follow platelets count trend/stability. -At this moment will hold Lovenox -SCDs for DVT prophylaxis.

## 2022-01-19 NOTE — Progress Notes (Signed)
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  He has had some posttussive emesis.  He has had subjective fevers and chills.  He has a headache but denies any visual disturbance.  He denies any hemoptysis, hematemesis, abdominal pain, dysuria, hematuria, hematochezia, melena. He went to his PCP on the morning of 01/18/2022.  He was noted to be hypoxic in the low 80s.  He was sent to the emergency department for further evaluation. In the ED, the patient had low-grade temperature of 100.2 F.  He was tachycardic into the 120s.  He was hemodynamically stable with oxygen saturation 84% on room air.  He was placed on 3 L.  WBC 4.6, hemoglobin 12.7, platelets 64,000.  Sodium 142, potassium 4.1, bicarbonate 31, serum creatinine 0.68.  AST 23, ALT 29, alk phos 378, total bilirubin 0.4.  The patient was initially started on ceftriaxone and azithromycin.    Assessment and Plan: Lobar pneumonia ([place]) Given the patient's immunocompromise state and frequent hospitalizations--bone antibiotic coverage -Start cefepime -Azithromycin -MRSA screen Follow blood cultures  Acute respiratory failure with hypoxia ([place]) Presented with tachypnea and hypoxia 84% Secondary to pneumonia Stable on 3 L Wean oxygen as tolerated  Severe sepsis ([place]) Presented with tachypnea, tachycardia, and respiratory failure Secondary to pneumonia Lactic acid peaked 1.4 PCT 0.13 Start IV fluids  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[place]) Follows with hematology/oncology at [facility] -Next appointment 02/01/2022 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 Flow shows no distinct blas population and T-cell large granular lymphocytosis (51% of T cells). Chimerism 99.8% donor.  07/17/2020 - 07/24/2020 Chemotherapy  INPT ONC BMT Adult Allogeneic Transplant Fludarabine / Busulfan (Myeloablative      Family Communication:   spouse updated at bedside 9/12  Consultants:  none  Code Status:  FULL   DVT Prophylaxis:  SCDs   Procedures: As Listed in Progress Note Above  Antibiotics: Cefepime 9/12>> Azithro 9/11>>     Subjective: Patient is generalized weakness.  He continues to have shortness of breath with exertion.  He has a nonproductive cough.  There is no nausea, vomiting, diarrhea, abdominal pain.  There is no hemoptysis.  He has subjective fevers and chills.  Objective: Vitals:   01/18/22 1915 01/18/22 2035 01/18/22 2332 01/19/22 0412  BP: 106/73 131/86 122/77 125/74  Pulse: 78 78 70 92  Resp: (!) 23 16 18    Temp:  (!) 97 F (36.1 C) (!) 96.9 F (36.1 C) 98.3 F (36.8 C)  TempSrc:    Oral  SpO2: 97% 98% 99% 94%  Weight:    83.2 kg  Height:        Intake/Output Summary (Last 24 hours) at 01/19/2022 0946 Last data filed at 01/19/2022 3428 Gross per 24 hour  Intake 1133.2 ml  Output --  Net 1133.2 ml   Weight change:  Exam:  General:  Pt is alert, follows commands appropriately, not in acute distress HEENT: No icterus, No thrush, No neck  mass, [place]/AT Cardiovascular: RRR, S1/S2, no rubs, no gallops Respiratory: Bibasal rales but no wheezing Abdomen: Soft/+BS, non tender, non distended, no guarding Extremities: No edema, No lymphangitis, No petechiae, No rashes, no synovitis   Data Reviewed: I have personally reviewed following labs and imaging  studies Basic Metabolic Panel: Recent Labs  Lab 01/18/22 1405 01/18/22 1955 01/19/22 0446  NA 136  --  142  K 3.9  --  4.1  CL 101  --  106  CO2 27  --  31  GLUCOSE 154*  --  152*  BUN 10  --  11  CREATININE 0.70 0.79 0.68  CALCIUM 8.6*  --  8.6*  MG  --  2.0  --    Liver Function Tests: Recent Labs  Lab 01/18/22 1405 01/19/22 0446  AST 29 23  ALT 33 29  ALKPHOS 86 78  BILITOT 0.7 0.4  PROT 5.7* 5.1*  ALBUMIN 2.9* 2.5*   No results for input(s): "LIPASE", "AMYLASE" in the last 168 hours. No results for input(s): "AMMONIA" in the last 168 hours. Coagulation Profile: Recent Labs  Lab 01/19/22 0446  INR 1.1   CBC: Recent Labs  Lab 01/18/22 1405 01/18/22 1955 01/19/22 0446  WBC 6.6 4.2 4.6  NEUTROABS 3.1  --   --   HGB 13.4 11.6* 12.7*  HCT 39.3 34.4* 38.2*  MCV 115.2* 116.6* 117.2*  PLT 74* 62* 64*   Cardiac Enzymes: No results for input(s): "CKTOTAL", "CKMB", "CKMBINDEX", "TROPONINI" in the last 168 hours. BNP: Invalid input(s): "POCBNP" CBG: No results for input(s): "GLUCAP" in the last 168 hours. HbA1C: No results for input(s): "HGBA1C" in the last 72 hours. Urine analysis:    Component Value Date/Time   COLORURINE YELLOW 01/19/2022 0630   APPEARANCEUR CLEAR 01/19/2022 0630   LABSPEC 1.014 01/19/2022 0630   PHURINE 6.0 01/19/2022 0630   GLUCOSEU NEGATIVE 01/19/2022 0630   HGBUR NEGATIVE 01/19/2022 0630   BILIRUBINUR NEGATIVE 01/19/2022 0630   KETONESUR NEGATIVE 01/19/2022 0630   PROTEINUR NEGATIVE 01/19/2022 0630   NITRITE NEGATIVE 01/19/2022 0630   LEUKOCYTESUR NEGATIVE 01/19/2022 0630   Sepsis Labs: @LABRCNTIP (procalcitonin:4,lacticidven:4) ) Recent Results (from the past 240 hour(s))  SARS Coronavirus 2 by RT PCR (hospital order, performed in [place] hospital lab) *cepheid single result test* Anterior Nasal Swab     Status: None   Collection Time: 01/18/22  1:53 PM   Specimen: Anterior Nasal Swab  Result Value Ref Range Status    SARS Coronavirus 2 by RT PCR NEGATIVE NEGATIVE Final    Comment: (NOTE) SARS-CoV-2 target nucleic acids are NOT DETECTED.  The SARS-CoV-2 RNA is generally detectable in upper and lower respiratory specimens during the acute phase of infection. The lowest concentration of SARS-CoV-2 viral copies this assay can detect is 250 copies / mL. A negative result does not preclude SARS-CoV-2 infection and should not be used as the sole basis for treatment or other patient management decisions.  A negative result may occur with improper specimen collection / handling, submission of specimen other than nasopharyngeal swab, presence of viral mutation(s) within the areas targeted by this assay, and inadequate number of viral copies (<250 copies / mL). A negative result must be combined with clinical observations, patient history, and epidemiological information.  Fact Sheet for Patients:   https://www.patel.info/  Fact Sheet for Healthcare Providers: https://hall.com/  This test is not yet approved or  cleared by the [place] FDA and has been authorized for detection and/or diagnosis of SARS-CoV-2 by FDA under  an Emergency Use Authorization (EUA).  This EUA will remain in effect (meaning this test can be used) for the duration of the COVID-19 declaration under Section 564(b)(1) of the Act, 21 U.S.C. section 360bbb-3(b)(1), unless the authorization is terminated or revoked sooner.  Performed at [facility], [address], [place], [place] [address]   Culture, blood (single)     Status: None (Preliminary result)   Collection Time: 01/18/22  3:04 PM   Specimen: Right Antecubital; Blood  Result Value Ref Range Status   Specimen Description RIGHT ANTECUBITAL  Final   Special Requests   Final    BOTTLES DRAWN AEROBIC ONLY Blood Culture results may not be optimal due to an excessive volume of blood received in culture bottles   Culture   Final    NO  GROWTH < 24 HOURS Performed at [facility], [address] [place], [place] [address]    Report Status PENDING  Incomplete     Scheduled Meds:  acyclovir  800 mg Oral BID   docusate sodium  100 mg Oral BID   enoxaparin (LOVENOX) injection  40 mg Subcutaneous U98J   folic acid  1 mg Oral QHS   melatonin  6 mg Oral Daily   pantoprazole  40 mg Oral BID   PARoxetine  25 mg Oral Daily   tiZANidine  4 mg Oral QHS   ursodiol  600 mg Oral BID   Continuous Infusions:  sodium chloride 125 mL/hr at 01/19/22 0513   azithromycin     ceFEPime (MAXIPIME) IV     lactated ringers      Procedures/Studies: DG Chest 2 View  Result Date: 01/18/2022 CLINICAL DATA:  CMML, MDS, shortness of breath with cough and wheezing EXAM: CHEST - 2 VIEW COMPARISON:  02/16/2020 FINDINGS: Right subclavian power port catheter tip lower SVC level as before. Lower lung volumes with new bilateral mixed interstitial patchy airspace opacities worse in the upper lobes concerning for pneumonia, including atypical pneumonia. No large effusion or pneumothorax. Trachea midline. Stable mild cardiac enlargement. Degenerative changes noted spine. IMPRESSION: Bilateral mixed interstitial and airspace opacities worse in the upper lobes compatible with pneumonia. See above comment. Electronically Signed   By: Jerilynn Mages.  Shick M.D.   On: 01/18/2022 13:37    Orson Eva, DO  [facility]  If 7PM-7AM, please contact night-coverage [email or web address] Password [facility] 01/19/2022, 9:46 AM   LOS: 1 day

## 2022-01-19 NOTE — Assessment & Plan Note (Signed)
Follow-up heme/onc [facility] 02/01/22

## 2022-01-19 NOTE — Evaluation (Signed)
Clinical/Bedside Swallow Evaluation Patient Details  Name: Hunter Smith MRN: [id number] Date of Birth: [birth year]  Today's Date: 01/19/2022 Time: SLP Start Time (ACUTE ONLY): 21 SLP Stop Time (ACUTE ONLY): 1644 SLP Time Calculation (min) (ACUTE ONLY): 18 min  Past Medical History:  Past Medical History:  Diagnosis Date   Cancer ([place])    Port-A-Cath in place 12/28/2019   Past Surgical History:  Past Surgical History:  Procedure Laterality Date   COLONOSCOPY     PORTACATH PLACEMENT Right 01/18/2020   Procedure: INSERTION PORT-A-CATH;  Surgeon: Aviva Signs, MD;  Location: [facility];  Service: General;  Laterality: Right;   HPI:  61 year old male with a history of 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  He has had some posttussive emesis.  He has had subjective fevers and chills.  He has a headache but denies any visual disturbance.  He denies any hemoptysis, hematemesis, abdominal pain, dysuria, hematuria, hematochezia, melena. BSE requested.    Assessment / Plan / Recommendation  Clinical Impression  Clinical swallowing evaluation completed while Pt was sitting upright in bed; Pt denies dysphagia. Pt consumed limited trials of puree, regular and thin liquids without overt s/sx of aspiration. Pt reports having little appetite since being in the hospital. SLP provided education and reviewed universal aspiration precautions. Recommend continue regular diet with thin liquids and meds to be administered whole with liquids. There are no further ST needs noted at this time, ST will sign off. Thank you, SLP Visit Diagnosis: Dysphagia, unspecified (R13.10)    Aspiration Risk       Diet Recommendation Regular;Thin liquid   Liquid Administration via: Cup;Straw Medication Administration: Whole meds with liquid Supervision: Patient able to self feed Compensations: Minimize environmental  distractions;Slow rate;Small sips/bites Postural Changes: Seated upright at 90 degrees    Other  Recommendations Oral Care Recommendations: Oral care BID    Recommendations for follow up therapy are one component of a multi-disciplinary discharge planning process, led by the attending physician.  Recommendations may be updated based on patient status, additional functional criteria and insurance authorization.  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  He has had some posttussive emesis.  He has had subjective fevers and chills.  He has a headache but denies any visual disturbance.  He denies any hemoptysis, hematemesis, abdominal pain, dysuria, hematuria, hematochezia, melena. Type of Study: Bedside Swallow Evaluation Previous Swallow Assessment: none in chart Diet Prior to this Study: Regular;Thin liquids Temperature Spikes Noted: No Respiratory Status: Room air History of Recent Intubation: No Behavior/Cognition: Alert;Cooperative;Pleasant mood Oral Cavity Assessment: Within Functional Limits Oral Care Completed by SLP: Recent completion by staff Oral Cavity - Dentition: Adequate natural dentition Vision: Functional for self-feeding Self-Feeding Abilities: Able to feed self Patient Positioning: Upright in bed Baseline Vocal Quality: Normal Volitional Cough: Strong Volitional Swallow: Able to elicit    Oral/Motor/Sensory Function Overall Oral Motor/Sensory Function: Within functional limits   Ice Chips Ice chips: Within functional limits   Thin Liquid Thin Liquid: Within functional limits    Nectar Thick Nectar Thick Liquid: Not tested   Honey Thick Honey Thick Liquid: Not tested   Puree Puree: Within functional limits   Solid  Solid: Within functional limits     Mackie Goon H. Roddie Mc, CCC-SLP Speech Language Pathologist  Wende Bushy 01/19/2022,4:44 PM

## 2022-01-19 NOTE — Assessment & Plan Note (Addendum)
-  Presented with tachypnea, tachycardia, and respiratory failure with hypoxia -Secondary to pneumonia -Lactic acid peaked 1.4 PCT 0.13 -Continue to maintain adequate hydration; now orally at that is presence of vascular congestion and worsening respiratory distress and what appears to be interstitial edema. -IV Lasix x1 around 1800 on 01/23/22 will be provided. -Chest x-ray demonstrated slight improvement in aeration. -Continue weaning of oxygen supplementation as tolerated. -Continue supportive care and as needed use of needed antipyretics.

## 2022-01-19 NOTE — Assessment & Plan Note (Addendum)
-  Patient has schedule outpatient follow-up with hematology/oncology at [facility] on (02/01/2022) -will try to transfer him there. 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

## 2022-01-19 NOTE — Hospital Course (Signed)
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  He has had some posttussive emesis.  He has had subjective fevers and chills.  He has a headache but denies any visual disturbance.  He denies any hemoptysis, hematemesis, abdominal pain, dysuria, hematuria, hematochezia, melena. He went to his PCP on the morning of 01/18/2022.  He was noted to be hypoxic in the low 80s.  He was sent to the emergency department for further evaluation. In the ED, the patient had low-grade temperature of 100.2 F.  He was tachycardic into the 120s.  He was hemodynamically stable with oxygen saturation 84% on room air.  He was placed on 3 L.  WBC 4.6, hemoglobin 12.7, platelets 64,000.  Sodium 142, potassium 4.1, bicarbonate 31, serum creatinine 0.68.  AST 23, ALT 29, alk phos 378, total bilirubin 0.4.  The patient was initially started on ceftriaxone and azithromycin.

## 2022-01-19 NOTE — Assessment & Plan Note (Addendum)
-  Presented with tachypnea and hypoxia 84% on room air at time of admission. -Tolerating 15 L high flow nasal cannula at time of examination.  (O2 sats 90-93%). -BiPAP was required overnight.. -Will wean off oxygen supplementation as tolerated -Repeat chest x-ray in a.m. -Continue maintaining adequate hydration orally; IV fluids at this moment discontinue. -IV Lasix x1 more dose to be given today -Continue following closely I's and O's and daily weights. -Continue current antibiotic therapy.

## 2022-01-20 ENCOUNTER — Other Ambulatory Visit: Payer: Self-pay

## 2022-01-20 DIAGNOSIS — J9601 Acute respiratory failure with hypoxia: Secondary | ICD-10-CM | POA: Diagnosis not present

## 2022-01-20 DIAGNOSIS — D46Z Other myelodysplastic syndromes: Secondary | ICD-10-CM | POA: Diagnosis not present

## 2022-01-20 DIAGNOSIS — D696 Thrombocytopenia, unspecified: Secondary | ICD-10-CM

## 2022-01-20 DIAGNOSIS — C9311 Chronic myelomonocytic leukemia, in remission: Secondary | ICD-10-CM | POA: Diagnosis not present

## 2022-01-20 DIAGNOSIS — J181 Lobar pneumonia, unspecified organism: Secondary | ICD-10-CM | POA: Diagnosis not present

## 2022-01-20 LAB — BLOOD GAS, ARTERIAL
Acid-Base Excess: 7.4 mmol/L — ABNORMAL HIGH (ref 0.0–2.0)
Bicarbonate: 33.3 mmol/L — ABNORMAL HIGH (ref 20.0–28.0)
Drawn by: 38235
FIO2: 60 %
O2 Saturation: 93.7 %
Patient temperature: 37
pCO2 arterial: 49 mmHg — ABNORMAL HIGH (ref 32–48)
pH, Arterial: 7.44 (ref 7.35–7.45)
pO2, Arterial: 60 mmHg — ABNORMAL LOW (ref 83–108)

## 2022-01-20 LAB — CBC WITH DIFFERENTIAL/PLATELET
Abs Immature Granulocytes: 0.2 10*3/uL — ABNORMAL HIGH (ref 0.00–0.07)
Basophils Absolute: 0 10*3/uL (ref 0.0–0.1)
Basophils Relative: 0 %
Eosinophils Absolute: 0.1 10*3/uL (ref 0.0–0.5)
Eosinophils Relative: 4 %
HCT: 47.4 % (ref 39.0–52.0)
Hemoglobin: 15.8 g/dL (ref 13.0–17.0)
Lymphocytes Relative: 23 %
Lymphs Abs: 0.8 10*3/uL (ref 0.7–4.0)
MCH: 38.5 pg — ABNORMAL HIGH (ref 26.0–34.0)
MCHC: 33.3 g/dL (ref 30.0–36.0)
MCV: 115.6 fL — ABNORMAL HIGH (ref 80.0–100.0)
Metamyelocytes Relative: 2 %
Monocytes Absolute: 0.3 10*3/uL (ref 0.1–1.0)
Monocytes Relative: 9 %
Myelocytes: 3 %
Neutro Abs: 1.9 10*3/uL (ref 1.7–7.7)
Neutrophils Relative %: 59 %
Platelets: 35 10*3/uL — ABNORMAL LOW (ref 150–400)
RBC: 4.1 MIL/uL — ABNORMAL LOW (ref 4.22–5.81)
RDW: 13.1 % (ref 11.5–15.5)
WBC: 3.3 10*3/uL — ABNORMAL LOW (ref 4.0–10.5)
nRBC: 0 % (ref 0.0–0.2)

## 2022-01-20 LAB — BASIC METABOLIC PANEL
Anion gap: 4 — ABNORMAL LOW (ref 5–15)
BUN: 8 mg/dL (ref 6–20)
CO2: 30 mmol/L (ref 22–32)
Calcium: 8.1 mg/dL — ABNORMAL LOW (ref 8.9–10.3)
Chloride: 102 mmol/L (ref 98–111)
Creatinine, Ser: 0.6 mg/dL — ABNORMAL LOW (ref 0.61–1.24)
GFR, Estimated: >60 mL/min (ref >60)
Glucose, Bld: 129 mg/dL — ABNORMAL HIGH (ref 70–99)
Potassium: 3.9 mmol/L (ref 3.5–5.1)
Sodium: 136 mmol/L (ref 135–145)

## 2022-01-20 LAB — LEGIONELLA PNEUMOPHILA SEROGP 1 UR AG: L. pneumophila Serogp 1 Ur Ag: NEGATIVE

## 2022-01-20 LAB — MAGNESIUM: Magnesium: 1.8 mg/dL (ref 1.7–2.4)

## 2022-01-20 MED ORDER — CHLORHEXIDINE GLUCONATE CLOTH 2 % EX PADS
6.0000 | MEDICATED_PAD | Freq: Every day | CUTANEOUS | Status: DC
  Administered 2022-01-20 – 2022-01-25 (×7): 6 via TOPICAL

## 2022-01-20 MED ORDER — LEVALBUTEROL HCL 0.63 MG/3ML IN NEBU
INHALATION_SOLUTION | RESPIRATORY_TRACT | Status: AC
  Administered 2022-01-20: 0.63 mg
  Filled 2022-01-20: qty 3

## 2022-01-20 MED ORDER — BENZONATATE 100 MG PO CAPS
200.0000 mg | ORAL_CAPSULE | Freq: Three times a day (TID) | ORAL | Status: DC | PRN
  Administered 2022-01-20 – 2022-01-25 (×12): 200 mg via ORAL
  Filled 2022-01-20 (×12): qty 2

## 2022-01-20 MED ORDER — LEVALBUTEROL HCL 0.63 MG/3ML IN NEBU
0.6300 mg | INHALATION_SOLUTION | Freq: Four times a day (QID) | RESPIRATORY_TRACT | Status: DC
  Administered 2022-01-21 – 2022-01-25 (×20): 0.63 mg via RESPIRATORY_TRACT
  Filled 2022-01-20 (×20): qty 3

## 2022-01-20 NOTE — Progress Notes (Addendum)
RT called to assess patient due to low O2 sats and increased RR. ABG drawn (results on chart) and one time Xopenex treatment given. RN obtaining EKG (also on chart). Patient's RR was 24 bpm on my count. Breath sounds diminished throughout. O2 sat was 86% on 6 lpm; placed patient on 10 lpm salter HFNC. Patient looked better after treatment. Per RT protocol assessment started patient on Q6H scheduled treatments. Patient has pneumonia and admits he is not taking deep breaths because it hurts him. Encouraged pt to take deep breaths because that will help decrease his O2 requirements and clear the PNA. MD made aware. Will continue to monitor.

## 2022-01-20 NOTE — Progress Notes (Signed)
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  He has had some posttussive emesis.  He has had subjective fevers and chills.  He has a headache but denies any visual disturbance.  He denies any hemoptysis, hematemesis, abdominal pain, dysuria, hematuria, hematochezia, melena. He went to his PCP on the morning of 01/18/2022.  He was noted to be hypoxic in the low 80s.  He was sent to the emergency department for further evaluation. In the ED, the patient had low-grade temperature of 100.2 F.  He was tachycardic into the 120s.  He was hemodynamically stable with oxygen saturation 84% on room air.  He was placed on 3 L.  WBC 4.6, hemoglobin 12.7, platelets 64,000.  Sodium 142, potassium 4.1, bicarbonate 31, serum creatinine 0.68.  AST 23, ALT 29, alk phos 378, total bilirubin 0.4.  The patient was initially started on ceftriaxone and azithromycin.    Assessment and Plan: Lobar pneumonia ([place]) -Given the patient's immunocompromise state and frequent hospitalizations--continue broad IV antibiotics -Follow culture results -MRSA screen negative.  Acute respiratory failure with hypoxia ([facility]) -Presented with tachypnea and hypoxia 84% -Secondary to pneumonia -Stable on 2-3 L -Continue to wean oxygen as tolerated -Check desaturation screening.  Severe sepsis ([place]) -Presented with tachypnea, tachycardia, and respiratory failure with hypoxia -Secondary to pneumonia -Lactic acid peaked 1.4 PCT 0.13 -Continue to maintain adequate hydration and continue the use of IV antibiotics. -Continue supportive care and as needed antipyretics.  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   08/25/2021 Biopsy  Variably hypercellular marrow (30-70%) with erythroid hyperplasia, granulocytic hypoplasia, T-cell large granular lymphocytosis, and no increase in blasts. Flow shows no distinct blas population and T-cell large granular lymphocytosis (51% of T cells). Chimerism 99.8% donor.  07/17/2020 - 07/24/2020 Chemotherapy  INPT ONC BMT Adult Allogeneic Transplant Fludarabine / Busulfan (Myeloablative   Family Communication:   spouse updated at bedside 9/12  Consultants:  none  Code Status:  FULL   DVT Prophylaxis:  SCDs   Procedures: As Listed in Progress Note Above  Antibiotics: Cefepime 9/12>> Azithro 9/11>>   Subjective: Patient reports chills and subjective fever; no chest pain, no nausea, no vomiting.  Short winded with activity and experiencing intermittent coughing spells.  2-3 L nasal cannula supplementation in place.  Objective: Vitals:   01/19/22 1422 01/19/22 2213 01/20/22 0557 01/20/22 1444  BP: 121/72 (!) 149/81 (!) 118/54 126/80  Pulse: 86 (!) 102 81 80  Resp: _0 Temp: 98.2 F (36.8 C) (!) 97.3 F (36.3 C) 99.2 F (37.3 C) 98.8 F (37.1 C)  TempSrc: Oral  Oral Oral  SpO2: 95% 91% 93% (!) 89%  Weight:      Height:        Intake/Output Summary (Last 24 hours) at 01/20/2022 1837 Last data filed at 01/20/2022 1300 Gross per 24 hour  Intake 3192.45 ml  Output 600 ml  Net 2592.45 ml   Weight change:  Exam: General exam: Alert, awake, oriented x 3; still requiring oxygen supplementation on exam but feeling better.  No chest pain.  Having chills. Respiratory system: Rhonchi bilaterally; no using  accessory muscles.  Complaining of intermittent coughing spells.  2-3 L nasal cannula supplementation in place. Cardiovascular system:RRR. No murmurs, rubs, gallops.  No JVD. Gastrointestinal system: Abdomen is nondistended, soft and nontender. No organomegaly or masses felt. Normal bowel sounds heard. Central nervous system: Alert and oriented. No focal neurological deficits. Extremities: No cyanosis or clubbing. Skin: No petechiae. Psychiatry: Judgement and insight appear normal. Mood & affect appropriate.    Data Reviewed: I have personally reviewed following labs and imaging studies  Basic Metabolic Panel: Recent Labs  Lab 01/18/22 1405 01/18/22 1955 01/19/22 0446 01/20/22 0613  NA 136  --  142 136  K 3.9  --  4.1 3.9  CL 101  --  106 102  CO2 27  --  31 30  GLUCOSE 154*  --  152* 129*  BUN 10  --  11 8  CREATININE 0.70 0.79 0.68 0.60*  CALCIUM 8.6*  --  8.6* 8.1*  MG  --  2.0  --  1.8   Liver Function Tests: Recent Labs  Lab 01/18/22 1405 01/19/22 0446  AST 29 23  ALT 33 29  ALKPHOS 86 78  BILITOT 0.7 0.4  PROT 5.7* 5.1*  ALBUMIN 2.9* 2.5*   Coagulation Profile: Recent Labs  Lab 01/19/22 0446  INR 1.1   CBC: Recent Labs  Lab 01/18/22 1405 01/18/22 1955 01/19/22 0446 01/20/22 0613  WBC 6.6 4.2 4.6 3.3*  NEUTROABS 3.1  --   --  1.9  HGB 13.4 11.6* 12.7* 15.8  HCT 39.3 34.4* 38.2* 47.4  MCV 115.2* 116.6* 117.2* 115.6*  PLT 74* 62* 64* 35*   Urine analysis:    Component Value Date/Time   COLORURINE YELLOW 01/19/2022 0630   APPEARANCEUR CLEAR 01/19/2022 0630   LABSPEC 1.014 01/19/2022 0630   PHURINE 6.0 01/19/2022 0630   GLUCOSEU NEGATIVE 01/19/2022 0630   HGBUR NEGATIVE 01/19/2022 0630   BILIRUBINUR NEGATIVE 01/19/2022 0630   KETONESUR NEGATIVE 01/19/2022 0630   PROTEINUR NEGATIVE 01/19/2022 0630   NITRITE NEGATIVE 01/19/2022 0630   LEUKOCYTESUR NEGATIVE 01/19/2022 0630   Sepsis Labs: _0 (procalcitonin:4,lacticidven:4) ) Recent  Results (from the past 240 hour(s))  SARS Coronavirus 2 by RT PCR (hospital order, performed in [facility] hospital lab) *cepheid single result test* Anterior Nasal Swab     Status: None   Collection Time: 01/18/22  1:53 PM   Specimen: Anterior Nasal Swab  Result Value Ref Range Status   SARS Coronavirus 2 by RT PCR NEGATIVE NEGATIVE Final    Comment: (NOTE) SARS-CoV-2 target nucleic acids are NOT DETECTED.  The SARS-CoV-2 RNA is generally detectable in upper and lower respiratory specimens during the acute phase of infection. The lowest concentration of SARS-CoV-2 viral copies this assay can detect is 250 copies / mL. A negative result does not preclude SARS-CoV-2 infection and should not be used as the sole basis for treatment or other patient management decisions.  A negative result may occur with improper specimen collection / handling, submission of specimen other than nasopharyngeal swab, presence of viral mutation(s) within the areas targeted by this assay, and inadequate number of viral copies (<250 copies / mL). A negative result must be combined with clinical observations, patient history, and epidemiological information.  Fact Sheet for Patients:   https://www.patel.info/  Fact Sheet for Healthcare Providers: https://hall.com/  This test  is not yet approved or  cleared by the [place] and has been authorized for detection and/or diagnosis of SARS-CoV-2 by FDA under an Emergency Use Authorization (EUA).  This EUA will remain in effect (meaning this test can be used) for the duration of the COVID-19 declaration under Section 564(b)(1) of the Act, 21 U.S.C. section 360bbb-3(b)(1), unless the authorization is terminated or revoked sooner.  Performed at [facility], [address], [place], [place] [address]   Culture, blood (single)     Status: None (Preliminary result)   Collection Time: 01/18/22  3:04 PM   Specimen:  Right Antecubital; Blood  Result Value Ref Range Status   Specimen Description RIGHT ANTECUBITAL  Final   Special Requests   Final    BOTTLES DRAWN AEROBIC ONLY Blood Culture results may not be optimal due to an excessive volume of blood received in culture bottles   Culture   Final    NO GROWTH 2 DAYS Performed at [facility], [address], [place], [place] [address]    Report Status PENDING  Incomplete  MRSA Next Gen by PCR, Nasal     Status: None   Collection Time: 01/19/22  9:36 AM   Specimen: Nasal Mucosa; Nasal Swab  Result Value Ref Range Status   MRSA by PCR Next Gen NOT DETECTED NOT DETECTED Final    Comment: (NOTE) The GeneXpert MRSA Assay (FDA approved for NASAL specimens only), is one component of a comprehensive MRSA colonization surveillance program. It is not intended to diagnose MRSA infection nor to guide or monitor treatment for MRSA infections. Test performance is not FDA approved in patients less than 70 years old. Performed at [facility], [address] [place], [place] [address]      Scheduled Meds:  acyclovir  800 mg Oral BID   Chlorhexidine Gluconate Cloth  6 each Topical Daily   docusate sodium  100 mg Oral BID   enoxaparin (LOVENOX) injection  40 mg Subcutaneous C78L   folic acid  1 mg Oral QHS   melatonin  6 mg Oral Daily   pantoprazole  40 mg Oral BID   PARoxetine  25 mg Oral Daily   tiZANidine  4 mg Oral QHS   ursodiol  600 mg Oral BID   Continuous Infusions:  sodium chloride 100 mL/hr at 01/20/22 1155   azithromycin 500 mg (01/20/22 1635)   ceFEPime (MAXIPIME) IV 2 g (01/20/22 1203)    Procedures/Studies: CT Angio Chest Pulmonary Embolism (PE) W or WO Contrast  Result Date: 01/19/2022 CLINICAL DATA:  Chest pain with shortness of breath. EXAM: CT ANGIOGRAPHY CHEST WITH CONTRAST TECHNIQUE: Multidetector CT imaging of the chest was performed using the standard protocol during bolus administration of intravenous contrast. Multiplanar CT  image reconstructions and MIPs were obtained to evaluate the vascular anatomy. RADIATION DOSE REDUCTION: This exam was performed according to the departmental dose-optimization program which includes automated exposure control, adjustment of the mA and/or kV according to patient size and/or use of iterative reconstruction technique. CONTRAST:  30m OMNIPAQUE IOHEXOL 350 MG/ML SOLN COMPARISON:  None Available. FINDINGS: Cardiovascular: Satisfactory opacification of the pulmonary arteries to the segmental level. No evidence of pulmonary embolism. Normal heart size. There is a small pericardial effusion. Mediastinum/Nodes: There are enlarged right hilar lymph nodes measuring up to 16 mm. There is an enlarged subcarinal lymph node measuring 11 mm. Visualized esophagus and thyroid gland are within normal limits. Lungs/Pleura: There is bilateral patchy airspace consolidation throughout both lungs most prominent  in the bilateral upper lobes, right greater than left. There are small bilateral pleural effusions. There is some sparing of the lung bases. Trachea and central airways are patent. There is no evidence for pneumothorax. Upper Abdomen: No acute abnormality. Musculoskeletal: There are mild chronic compression deformities of T8 and T9. No acute fractures are seen. Review of the MIP images confirms the above findings. IMPRESSION: 1. No evidence for pulmonary embolism. 2. Small pericardial effusion. 3. Bilateral patchy airspace consolidation most significant in the bilateral upper lobes. Findings may be related to pulmonary edema, hemorrhage, and/or infection. 4. Small pleural effusions. 5. Subcarinal and right hilar lymphadenopathy. Electronically Signed   By: Ronney Asters M.D.   On: 01/19/2022 18:00   DG Chest 2 View  Result Date: 01/18/2022 CLINICAL DATA:  CMML, MDS, shortness of breath with cough and wheezing EXAM: CHEST - 2 VIEW COMPARISON:  02/16/2020 FINDINGS: Right subclavian power port catheter tip lower  SVC level as before. Lower lung volumes with new bilateral mixed interstitial patchy airspace opacities worse in the upper lobes concerning for pneumonia, including atypical pneumonia. No large effusion or pneumothorax. Trachea midline. Stable mild cardiac enlargement. Degenerative changes noted spine. IMPRESSION: Bilateral mixed interstitial and airspace opacities worse in the upper lobes compatible with pneumonia. See above comment. Electronically Signed   By: Jerilynn Mages.  Shick M.D.   On: 01/18/2022 13:37    Barton Dubois, MD  [facility]  If 7PM-7AM, please contact night-coverage [email or web address] Password [facility] 01/20/2022, 6:37 PM   LOS: 2 days

## 2022-01-20 NOTE — Plan of Care (Signed)
Episode of respiratory distress this evening. Patient requiring 10 L/min HFNC at present. Orders to transfer to SDU.   Problem: Fluid Volume: Goal: Hemodynamic stability will improve Outcome: Not Progressing   Problem: Clinical Measurements: Goal: Diagnostic test results will improve Outcome: Not Progressing Goal: Signs and symptoms of infection will decrease Outcome: Not Progressing   Problem: Respiratory: Goal: Ability to maintain adequate ventilation will improve Outcome: Not Progressing   Problem: Education: Goal: Knowledge of General Education information will improve Description: Including pain rating scale, medication(s)/side effects and non-pharmacologic comfort measures Outcome: Not Progressing   Problem: Health Behavior/Discharge Planning: Goal: Ability to manage health-related needs will improve Outcome: Not Progressing   Problem: Clinical Measurements: Goal: Ability to maintain clinical measurements within normal limits will improve Outcome: Not Progressing Goal: Will remain free from infection Outcome: Not Progressing Goal: Diagnostic test results will improve Outcome: Not Progressing Goal: Respiratory complications will improve Outcome: Not Progressing Goal: Cardiovascular complication will be avoided Outcome: Not Progressing   Problem: Activity: Goal: Risk for activity intolerance will decrease Outcome: Not Progressing   Problem: Nutrition: Goal: Adequate nutrition will be maintained Outcome: Not Progressing   Problem: Coping: Goal: Level of anxiety will decrease Outcome: Not Progressing   Problem: Elimination: Goal: Will not experience complications related to bowel motility Outcome: Not Progressing Goal: Will not experience complications related to urinary retention Outcome: Not Progressing   Problem: Pain Managment: Goal: General experience of comfort will improve Outcome: Not Progressing   Problem: Safety: Goal: Ability to remain free  from injury will improve Outcome: Not Progressing   Problem: Skin Integrity: Goal: Risk for impaired skin integrity will decrease Outcome: Not Progressing

## 2022-01-21 ENCOUNTER — Inpatient Hospital Stay (HOSPITAL_COMMUNITY): Payer: 59

## 2022-01-21 DIAGNOSIS — D46Z Other myelodysplastic syndromes: Secondary | ICD-10-CM | POA: Diagnosis not present

## 2022-01-21 DIAGNOSIS — J9601 Acute respiratory failure with hypoxia: Secondary | ICD-10-CM | POA: Diagnosis not present

## 2022-01-21 DIAGNOSIS — C9311 Chronic myelomonocytic leukemia, in remission: Secondary | ICD-10-CM | POA: Diagnosis not present

## 2022-01-21 DIAGNOSIS — J181 Lobar pneumonia, unspecified organism: Secondary | ICD-10-CM | POA: Diagnosis not present

## 2022-01-21 LAB — CBC
HCT: 33.8 % — ABNORMAL LOW (ref 39.0–52.0)
Hemoglobin: 11.1 g/dL — ABNORMAL LOW (ref 13.0–17.0)
MCH: 38.7 pg — ABNORMAL HIGH (ref 26.0–34.0)
MCHC: 32.8 g/dL (ref 30.0–36.0)
MCV: 117.8 fL — ABNORMAL HIGH (ref 80.0–100.0)
Platelets: 49 10*3/uL — ABNORMAL LOW (ref 150–400)
RBC: 2.87 MIL/uL — ABNORMAL LOW (ref 4.22–5.81)
RDW: 12.7 % (ref 11.5–15.5)
WBC: 5.7 10*3/uL (ref 4.0–10.5)
nRBC: 0 % (ref 0.0–0.2)

## 2022-01-21 LAB — COMPREHENSIVE METABOLIC PANEL
ALT: 29 U/L (ref 0–44)
AST: 28 U/L (ref 15–41)
Albumin: 2.2 g/dL — ABNORMAL LOW (ref 3.5–5.0)
Alkaline Phosphatase: 70 U/L (ref 38–126)
Anion gap: 2 — ABNORMAL LOW (ref 5–15)
BUN: 10 mg/dL (ref 6–20)
CO2: 33 mmol/L — ABNORMAL HIGH (ref 22–32)
Calcium: 8.4 mg/dL — ABNORMAL LOW (ref 8.9–10.3)
Chloride: 100 mmol/L (ref 98–111)
Creatinine, Ser: 0.55 mg/dL — ABNORMAL LOW (ref 0.61–1.24)
GFR, Estimated: >60 mL/min (ref >60)
Glucose, Bld: 136 mg/dL — ABNORMAL HIGH (ref 70–99)
Potassium: 4.3 mmol/L (ref 3.5–5.1)
Sodium: 135 mmol/L (ref 135–145)
Total Bilirubin: 0.8 mg/dL (ref 0.3–1.2)
Total Protein: 4.7 g/dL — ABNORMAL LOW (ref 6.5–8.1)

## 2022-01-21 LAB — MRSA NEXT GEN BY PCR, NASAL: MRSA by PCR Next Gen: NOT DETECTED

## 2022-01-21 MED ORDER — BUDESONIDE 0.25 MG/2ML IN SUSP
0.2500 mg | Freq: Two times a day (BID) | RESPIRATORY_TRACT | Status: DC
  Administered 2022-01-21 – 2022-01-23 (×5): 0.25 mg via RESPIRATORY_TRACT
  Filled 2022-01-21 (×5): qty 2

## 2022-01-21 MED ORDER — ALBUMIN HUMAN 25 % IV SOLN
12.5000 g | Freq: Four times a day (QID) | INTRAVENOUS | Status: AC
  Administered 2022-01-22 (×3): 12.5 g via INTRAVENOUS
  Filled 2022-01-21 (×3): qty 50

## 2022-01-21 MED ORDER — FUROSEMIDE 10 MG/ML IJ SOLN
20.0000 mg | Freq: Once | INTRAMUSCULAR | Status: AC
  Administered 2022-01-21: 20 mg via INTRAVENOUS
  Filled 2022-01-21: qty 2

## 2022-01-21 MED ORDER — MIDODRINE HCL 5 MG PO TABS
10.0000 mg | ORAL_TABLET | Freq: Three times a day (TID) | ORAL | Status: DC
  Administered 2022-01-22 – 2022-01-25 (×10): 10 mg via ORAL
  Filled 2022-01-21 (×10): qty 2

## 2022-01-21 MED ORDER — PREDNISONE 20 MG PO TABS
40.0000 mg | ORAL_TABLET | Freq: Every day | ORAL | Status: DC
  Administered 2022-01-22 – 2022-01-23 (×2): 40 mg via ORAL
  Filled 2022-01-21 (×2): qty 2

## 2022-01-21 MED ORDER — MORPHINE SULFATE (PF) 4 MG/ML IV SOLN: 4.0000 mg | Freq: Once | INTRAVENOUS | Status: DC

## 2022-01-21 MED ORDER — PROCHLORPERAZINE EDISYLATE 10 MG/2ML IJ SOLN
10.0000 mg | Freq: Once | INTRAMUSCULAR | Status: AC
  Administered 2022-01-21: 10 mg via INTRAVENOUS
  Filled 2022-01-21: qty 2

## 2022-01-21 NOTE — Progress Notes (Signed)
Patient off BIPAP to HFNC for a break sat 96%

## 2022-01-21 NOTE — Progress Notes (Signed)
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  He has had some posttussive emesis.  He has had subjective fevers and chills.  He has a headache but denies any visual disturbance.  He denies any hemoptysis, hematemesis, abdominal pain, dysuria, hematuria, hematochezia, melena. He went to his PCP on the morning of 01/18/2022.  He was noted to be hypoxic in the low 80s.  He was sent to the emergency department for further evaluation. In the ED, the patient had low-grade temperature of 100.2 F.  He was tachycardic into the 120s.  He was hemodynamically stable with oxygen saturation 84% on room air.  He was placed on 3 L.  WBC 4.6, hemoglobin 12.7, platelets 64,000.  Sodium 142, potassium 4.1, bicarbonate 31, serum creatinine 0.68.  AST 23, ALT 29, alk phos 378, total bilirubin 0.4.  The patient was initially started on ceftriaxone and azithromycin.    Assessment and Plan: Lobar pneumonia ([place]) -Given the patient's immunocompromise state and frequent hospitalizations--continue broad IV antibiotics -Follow culture results -MRSA screen negative.  Acute respiratory failure with hypoxia ([place]) -Presented with tachypnea and hypoxia 84% on room air at time of admission. -Initially tolerated treatment adequately with 2-3 L supplementation.  On 01/20/2022 overnight patient spiked fever and ended developing worsening respiratory distress with desaturation despite increasing nasal cannula to 15 L.  BiPAP was required. -Will wean off oxygen supplementation as tolerated -Chest x-ray demonstrating vascular congestion and beginning of interstitial edema -Stop IV fluid -IV Lasix will be provided -Continue current antibiotic therapy.  Severe sepsis  ([place]) -Presented with tachypnea, tachycardia, and respiratory failure with hypoxia -Secondary to pneumonia -Lactic acid peaked 1.4 PCT 0.13 -Continue to maintain adequate hydration; now orally at that is presence of vascular congestion and worsening respiratory distress and what appears to be interstitial edema. -IV Lasix will be provided. -Continue supportive care and as needed antipyretics.  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   08/25/2021 Biopsy  Variably hypercellular marrow (30-70%) with erythroid hyperplasia, granulocytic hypoplasia, T-cell large granular lymphocytosis, and no increase in blasts. Flow shows no distinct blas population and T-cell large granular lymphocytosis (51% of T cells). Chimerism 99.8% donor.  07/17/2020 - 07/24/2020 Chemotherapy  INPT ONC BMT Adult Allogeneic Transplant Fludarabine / Busulfan (Myeloablative   Family Communication:   Daughter updated at bedside.  Consultants:  none  Code Status:  FULL   DVT Prophylaxis:  SCDs   Procedures: As Listed in Progress Note Above  Antibiotics: Cefepime 9/12>> Azithro 9/11>>   Subjective: Positive fever overnight; respiratory distress and desaturation requiring the use of BiPAP.  Reports no chest pain, no nausea, no vomiting, no focal weaknesses.  Objective: Vitals:   01/21/22 1313 01/21/22 1400 01/21/22 1500 01/21/22 1600  BP:  (!) 120/59 123/72 131/74  Pulse: 79 84 83 85  Resp: (!) 23 14 (!) 29 (!) 44  Temp:      TempSrc:      SpO2: 94% 91% 96%  95%  Weight:      Height:        Intake/Output  Summary (Last 24 hours) at 01/21/2022 1648 Last data filed at 01/21/2022 1100 Gross per 24 hour  Intake 2606.99 ml  Output 1100 ml  Net 1506.99 ml   Weight change:   Exam: General exam: Alert, awake, oriented x 3; low-grade temperature overnight; worsening respiratory distress to the point of requiring BiPAP.  No chest pain, no nausea, no vomiting. Respiratory system: Decreased breath sounds at the bases; fine crackles appreciated on exam.  Positive rhonchi.  No using accessory muscles.  Tachypnea with minimal exertion appreciated.  BiPAP in place. Cardiovascular system: Rate controlled, no rubs, no gallops, no JVD. Gastrointestinal system: Abdomen is nondistended, soft and nontender. No organomegaly or masses felt. Normal bowel sounds heard. Central nervous system: Alert and oriented. No focal neurological deficits. Extremities: No cyanosis or clubbing. Skin: No petechiae. Psychiatry: Judgement and insight appear normal. Mood & affect appropriate.    Data Reviewed: I have personally reviewed following labs and imaging studies  Basic Metabolic Panel: Recent Labs  Lab 01/18/22 1405 01/18/22 1955 01/19/22 0446 01/20/22 0613 01/21/22 0510  NA 136  --  142 136 135  K 3.9  --  4.1 3.9 4.3  CL 101  --  106 102 100  CO2 27  --  31 30 33*  GLUCOSE 154*  --  152* 129* 136*  BUN 10  --  11 8 10   CREATININE 0.70 0.79 0.68 0.60* 0.55*  CALCIUM 8.6*  --  8.6* 8.1* 8.4*  MG  --  2.0  --  1.8  --    Liver Function Tests: Recent Labs  Lab 01/18/22 1405 01/19/22 0446 01/21/22 0510  AST 29 23 28   ALT 33 29 29  ALKPHOS 86 78 70  BILITOT 0.7 0.4 0.8  PROT 5.7* 5.1* 4.7*  ALBUMIN 2.9* 2.5* 2.2*   Coagulation Profile: Recent Labs  Lab 01/19/22 0446  INR 1.1   CBC: Recent Labs  Lab 01/18/22 1405 01/18/22 1955 01/19/22 0446 01/20/22 0613 01/21/22 0510  WBC 6.6 4.2 4.6 3.3* 5.7  NEUTROABS 3.1  --   --  1.9  --   HGB 13.4 11.6* 12.7* 15.8 11.1*  HCT 39.3 34.4* 38.2* 47.4  33.8*  MCV 115.2* 116.6* 117.2* 115.6* 117.8*  PLT 74* 62* 64* 35* 49*   Urine analysis:    Component Value Date/Time   COLORURINE YELLOW 01/19/2022 0630   APPEARANCEUR CLEAR 01/19/2022 0630   LABSPEC 1.014 01/19/2022 0630   PHURINE 6.0 01/19/2022 0630   GLUCOSEU NEGATIVE 01/19/2022 0630   HGBUR NEGATIVE 01/19/2022 0630   BILIRUBINUR NEGATIVE 01/19/2022 0630   KETONESUR NEGATIVE 01/19/2022 0630   PROTEINUR NEGATIVE 01/19/2022 0630   NITRITE NEGATIVE 01/19/2022 0630   LEUKOCYTESUR NEGATIVE 01/19/2022 0630   Sepsis Labs: @LABRCNTIP (procalcitonin:4,lacticidven:4) ) Recent Results (from the past 240 hour(s))  SARS Coronavirus 2 by RT PCR (hospital order, performed in [place] hospital lab) *cepheid single result test* Anterior Nasal Swab     Status: None   Collection Time: 01/18/22  1:53 PM   Specimen: Anterior Nasal Swab  Result Value Ref Range Status   SARS Coronavirus 2 by RT PCR NEGATIVE NEGATIVE Final    Comment: (NOTE) SARS-CoV-2 target nucleic acids are NOT DETECTED.  The SARS-CoV-2 RNA is generally detectable in upper and lower respiratory specimens during the acute phase of infection. The lowest concentration of SARS-CoV-2 viral copies this assay can detect is 250 copies /  mL. A negative result does not preclude SARS-CoV-2 infection and should not be used as the sole basis for treatment or other patient management decisions.  A negative result may occur with improper specimen collection / handling, submission of specimen other than nasopharyngeal swab, presence of viral mutation(s) within the areas targeted by this assay, and inadequate number of viral copies (<250 copies / mL). A negative result must be combined with clinical observations, patient history, and epidemiological information.  Fact Sheet for Patients:   https://www.patel.info/  Fact Sheet for Healthcare Providers: https://hall.com/  This test is not yet  approved or  cleared by the [place] FDA and has been authorized for detection and/or diagnosis of SARS-CoV-2 by FDA under an Emergency Use Authorization (EUA).  This EUA will remain in effect (meaning this test can be used) for the duration of the COVID-19 declaration under Section 564(b)(1) of the Act, 21 U.S.C. section 360bbb-3(b)(1), unless the authorization is terminated or revoked sooner.  Performed at [facility], [address] [place], [place] [address]   Culture, blood (single)     Status: None (Preliminary result)   Collection Time: 01/18/22  3:04 PM   Specimen: Right Antecubital; Blood  Result Value Ref Range Status   Specimen Description RIGHT ANTECUBITAL  Final   Special Requests   Final    BOTTLES DRAWN AEROBIC ONLY Blood Culture results may not be optimal due to an excessive volume of blood received in culture bottles   Culture   Final    NO GROWTH 2 DAYS Performed at [facility], [address] [place], [place] [address]    Report Status PENDING  Incomplete  MRSA Next Gen by PCR, Nasal     Status: None   Collection Time: 01/19/22  9:36 AM   Specimen: Nasal Mucosa; Nasal Swab  Result Value Ref Range Status   MRSA by PCR Next Gen NOT DETECTED NOT DETECTED Final    Comment: (NOTE) The GeneXpert MRSA Assay (FDA approved for NASAL specimens only), is one component of a comprehensive MRSA colonization surveillance program. It is not intended to diagnose MRSA infection nor to guide or monitor treatment for MRSA infections. Test performance is not FDA approved in patients less than 18 years old. Performed at [facility], [address], [place], [place] [address]   MRSA Next Gen by PCR, Nasal     Status: None   Collection Time: 01/20/22 11:36 PM   Specimen: Nasal Mucosa; Nasal Swab  Result Value Ref Range Status   MRSA by PCR Next Gen NOT DETECTED NOT DETECTED Final    Comment: (NOTE) The GeneXpert MRSA Assay (FDA approved for NASAL specimens only), is one  component of a comprehensive MRSA colonization surveillance program. It is not intended to diagnose MRSA infection nor to guide or monitor treatment for MRSA infections. Test performance is not FDA approved in patients less than 33 years old. Performed at [facility], [address], [place], [place] [address]   Expectorated Sputum Assessment w Gram Stain, Rflx to Resp Cult     Status: None (Preliminary result)   Collection Time: 01/21/22  3:58 AM   Specimen: Sputum  Result Value Ref Range Status   Specimen Description SPUTUM  Final   Special Requests NONE  Final   Sputum evaluation   Final    THIS SPECIMEN IS ACCEPTABLE FOR SPUTUM CULTURE Performed at [facility], [address], [place], [place] [address]    Report Status PENDING  Incomplete  Scheduled Meds:  acyclovir  800 mg Oral BID   budesonide (PULMICORT) nebulizer solution  0.25 mg Nebulization BID   Chlorhexidine Gluconate Cloth  6 each Topical Daily   docusate sodium  100 mg Oral BID   enoxaparin (LOVENOX) injection  40 mg Subcutaneous M38G   folic acid  1 mg Oral QHS   furosemide  20 mg Intravenous Once   levalbuterol  0.63 mg Nebulization Q6H   melatonin  6 mg Oral Daily   pantoprazole  40 mg Oral BID   PARoxetine  25 mg Oral Daily   [START ON 01/22/2022] predniSONE  40 mg Oral Q breakfast   tiZANidine  4 mg Oral QHS   ursodiol  600 mg Oral BID   Continuous Infusions:  sodium chloride 75 mL/hr at 01/21/22 0900   azithromycin 500 mg (01/21/22 1554)   ceFEPime (MAXIPIME) IV 2 g (01/21/22 1037)    Procedures/Studies: DG Chest Port 1 View  Result Date: 01/21/2022 CLINICAL DATA:  Hypoxia EXAM: PORTABLE CHEST 1 VIEW COMPARISON:  01/19/2022 CTA chest FINDINGS: Severe biapical consolidation. Right chest wall Port-A-Cath tip in the lower SVC. Mild cardiomegaly. IMPRESSION: Severe biapical consolidation. Electronically Signed   By: Ulyses Jarred M.D.   On: 01/21/2022 03:40   CT Angio Chest Pulmonary Embolism (PE) W  or WO Contrast  Result Date: 01/19/2022 CLINICAL DATA:  Chest pain with shortness of breath. EXAM: CT ANGIOGRAPHY CHEST WITH CONTRAST TECHNIQUE: Multidetector CT imaging of the chest was performed using the standard protocol during bolus administration of intravenous contrast. Multiplanar CT image reconstructions and MIPs were obtained to evaluate the vascular anatomy. RADIATION DOSE REDUCTION: This exam was performed according to the departmental dose-optimization program which includes automated exposure control, adjustment of the mA and/or kV according to patient size and/or use of iterative reconstruction technique. CONTRAST:  34m OMNIPAQUE IOHEXOL 350 MG/ML SOLN COMPARISON:  None Available. FINDINGS: Cardiovascular: Satisfactory opacification of the pulmonary arteries to the segmental level. No evidence of pulmonary embolism. Normal heart size. There is a small pericardial effusion. Mediastinum/Nodes: There are enlarged right hilar lymph nodes measuring up to 16 mm. There is an enlarged subcarinal lymph node measuring 11 mm. Visualized esophagus and thyroid gland are within normal limits. Lungs/Pleura: There is bilateral patchy airspace consolidation throughout both lungs most prominent in the bilateral upper lobes, right greater than left. There are small bilateral pleural effusions. There is some sparing of the lung bases. Trachea and central airways are patent. There is no evidence for pneumothorax. Upper Abdomen: No acute abnormality. Musculoskeletal: There are mild chronic compression deformities of T8 and T9. No acute fractures are seen. Review of the MIP images confirms the above findings. IMPRESSION: 1. No evidence for pulmonary embolism. 2. Small pericardial effusion. 3. Bilateral patchy airspace consolidation most significant in the bilateral upper lobes. Findings may be related to pulmonary edema, hemorrhage, and/or infection. 4. Small pleural effusions. 5. Subcarinal and right hilar  lymphadenopathy. Electronically Signed   By: ARonney AstersM.D.   On: 01/19/2022 18:00   DG Chest 2 View  Result Date: 01/18/2022 CLINICAL DATA:  CMML, MDS, shortness of breath with cough and wheezing EXAM: CHEST - 2 VIEW COMPARISON:  02/16/2020 FINDINGS: Right subclavian power port catheter tip lower SVC level as before. Lower lung volumes with new bilateral mixed interstitial patchy airspace opacities worse in the upper lobes concerning for pneumonia, including atypical pneumonia. No large effusion or pneumothorax. Trachea midline. Stable mild cardiac enlargement. Degenerative changes noted spine. IMPRESSION: Bilateral mixed interstitial  and airspace opacities worse in the upper lobes compatible with pneumonia. See above comment. Electronically Signed   By: Jerilynn Mages.  Shick M.D.   On: 01/18/2022 13:37    Barton Dubois, MD  [facility]  If 7PM-7AM, please contact night-coverage [email or web address] Password [email or web address] 01/21/2022, 4:48 PM   LOS: 3 days

## 2022-01-21 NOTE — Progress Notes (Signed)
RT called for desaturation and increased WOB on patient. Upon arrival patient was coughing without producing much sputum and sats were in the 70s on 15 lpm HFNC. Placed patient on BIPAP. Patient's WOB and sats improved after placing on BIPAP.

## 2022-01-21 NOTE — Progress Notes (Signed)
Patient doing well at this time. States he is breathing better now. Will see patient again for scheduled breathing treatment at 0200. Told patient to call if he felt in distress.

## 2022-01-22 DIAGNOSIS — J9601 Acute respiratory failure with hypoxia: Secondary | ICD-10-CM | POA: Diagnosis not present

## 2022-01-22 DIAGNOSIS — J181 Lobar pneumonia, unspecified organism: Secondary | ICD-10-CM | POA: Diagnosis not present

## 2022-01-22 DIAGNOSIS — C9311 Chronic myelomonocytic leukemia, in remission: Secondary | ICD-10-CM | POA: Diagnosis not present

## 2022-01-22 DIAGNOSIS — D46Z Other myelodysplastic syndromes: Secondary | ICD-10-CM | POA: Diagnosis not present

## 2022-01-22 LAB — COMPREHENSIVE METABOLIC PANEL
ALT: 31 U/L (ref 0–44)
AST: 31 U/L (ref 15–41)
Albumin: 2.4 g/dL — ABNORMAL LOW (ref 3.5–5.0)
Alkaline Phosphatase: 67 U/L (ref 38–126)
Anion gap: 5 (ref 5–15)
BUN: 12 mg/dL (ref 6–20)
CO2: 35 mmol/L — ABNORMAL HIGH (ref 22–32)
Calcium: 9 mg/dL (ref 8.9–10.3)
Chloride: 98 mmol/L (ref 98–111)
Creatinine, Ser: 0.56 mg/dL — ABNORMAL LOW (ref 0.61–1.24)
GFR, Estimated: >60 mL/min (ref >60)
Glucose, Bld: 144 mg/dL — ABNORMAL HIGH (ref 70–99)
Potassium: 3.7 mmol/L (ref 3.5–5.1)
Sodium: 138 mmol/L (ref 135–145)
Total Bilirubin: 1 mg/dL (ref 0.3–1.2)
Total Protein: 5.1 g/dL — ABNORMAL LOW (ref 6.5–8.1)

## 2022-01-22 LAB — EXPECTORATED SPUTUM ASSESSMENT W GRAM STAIN, RFLX TO RESP C

## 2022-01-22 MED ORDER — HYDROXYZINE HCL 25 MG PO TABS
25.0000 mg | ORAL_TABLET | Freq: Three times a day (TID) | ORAL | Status: DC | PRN
  Administered 2022-01-22: 25 mg via ORAL
  Filled 2022-01-22: qty 1

## 2022-01-22 MED ORDER — POLYVINYL ALCOHOL 1.4 % OP SOLN
1.0000 [drp] | OPHTHALMIC | Status: DC | PRN
  Administered 2022-01-22 – 2022-01-25 (×3): 1 [drp] via OPHTHALMIC
  Filled 2022-01-22: qty 15

## 2022-01-22 MED ORDER — FUROSEMIDE 10 MG/ML IJ SOLN
20.0000 mg | Freq: Once | INTRAMUSCULAR | Status: AC
  Administered 2022-01-22: 20 mg via INTRAVENOUS
  Filled 2022-01-22: qty 2

## 2022-01-22 NOTE — Progress Notes (Signed)
Attempted to get patient's O2 up with increased flow rate on HHFNC and breathing treatment. Patient continues to stay in the low to mid 80s with sat. Patient then had to urinate which cause a significant drop into the 70s. Decision made to place patient back on BIPAP and RN is placing a condom cath to prevent patient from having to get up to use the restroom. Will continue to monitor. Patient has little to no O2 reserve and it takes a while for his sats to come back up.

## 2022-01-22 NOTE — Progress Notes (Signed)
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  He has had some posttussive emesis.  He has had subjective fevers and chills.  He has a headache but denies any visual disturbance.  He denies any hemoptysis, hematemesis, abdominal pain, dysuria, hematuria, hematochezia, melena. He went to his PCP on the morning of 01/18/2022.  He was noted to be hypoxic in the low 80s.  He was sent to the emergency department for further evaluation. In the ED, the patient had low-grade temperature of 100.2 F.  He was tachycardic into the 120s.  He was hemodynamically stable with oxygen saturation 84% on room air.  He was placed on 3 L.  WBC 4.6, hemoglobin 12.7, platelets 64,000.  Sodium 142, potassium 4.1, bicarbonate 31, serum creatinine 0.68.  AST 23, ALT 29, alk phos 378, total bilirubin 0.4.  The patient was initially started on ceftriaxone and azithromycin.    Assessment and Plan: Lobar pneumonia ([place]) -Given the patient's immunocompromise state and frequent hospitalizations--continue broad IV antibiotics -Follow culture results -MRSA screen negative.  Acute respiratory failure with hypoxia (H[facility]) -Presented with tachypnea and hypoxia 84% on room air at time of admission. -Tolerating 15 L high flow nasal cannula at time of examination.  (O2 sats 90-93%). -BiPAP was required overnight.. -Will wean off oxygen supplementation as tolerated -Repeat chest x-ray in a.m. -Continue maintaining adequate hydration orally; IV fluids at this moment discontinue. -IV Lasix x1 more dose to be given today -Continue following closely I's and O's and daily weights. -Continue current antibiotic therapy.  Severe sepsis ([place]) -Presented with tachypnea, tachycardia,  and respiratory failure with hypoxia -Secondary to pneumonia -Lactic acid peaked 1.4 PCT 0.13 -Continue to maintain adequate hydration; now orally at that is presence of vascular congestion and worsening respiratory distress and what appears to be interstitial edema. -IV Lasix will be provided. -Continue supportive care and as needed antipyretics.  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   08/25/2021 Biopsy  Variably hypercellular marrow (30-70%) with erythroid hyperplasia, granulocytic hypoplasia, T-cell large granular lymphocytosis, and no increase in blasts. Flow shows no distinct blas population and T-cell large granular lymphocytosis (51% of T cells). Chimerism 99.8% donor.  07/17/2020 - 07/24/2020 Chemotherapy  INPT ONC BMT Adult Allogeneic Transplant Fludarabine / Busulfan (Myeloablative   Family Communication:   Daughter updated at bedside.  Consultants:  none  Code Status:  FULL   DVT Prophylaxis:  SCDs   Procedures: As Listed in Progress Note Above  Antibiotics: Cefepime 9/12>> Azithro 9/11>>   Subjective: Currently afebrile; no chest pain, no nausea, no vomiting.  Reports feeling slightly better.  Overnight required the use of BiPAP.  Currently on 15 L nasal cannula supplementation.  Objective: Vitals:   01/22/22 1600 01/22/22 1618 01/22/22 1700 01/22/22 1800  BP: 125/61  133/73 115/62  Pulse: 82 90 98 92  Resp: (!) 22 18    Temp:      TempSrc:      SpO2:  90%     Weight:      Height:        Intake/Output Summary (Last 24 hours) at 01/22/2022 1850 Last data  filed at 01/21/2022 2118 Gross per 24 hour  Intake --  Output 475 ml  Net -475 ml   Weight change:   Exam: General exam: Alert, awake, oriented x 3; reports some headache; currently afebrile.  No chest pain, no nausea, no vomiting.  Still short winded and requiring 15 L nasal cannula supplementation.  Overnight patient was on BiPAP. Respiratory system: Positive rhonchi bilaterally; fine crackles at the bases.  Tachypnea with minimal exertion.  No use of accessory muscle. Cardiovascular system:RRR. No rub or gallop; no JVD on exam. Gastrointestinal system: Abdomen is nondistended, soft and nontender. No organomegaly or masses felt. Normal bowel sounds heard. Central nervous system: Alert and oriented. No focal neurological deficits. Extremities: No cyanosis or clubbing. Skin: No petechiae.  Couple bruises appreciated on his left leg anterior aspect. Psychiatry: Judgement and insight appear normal. Mood & affect appropriate.    Data Reviewed: I have personally reviewed following labs and imaging studies  Basic Metabolic Panel: Recent Labs  Lab 01/18/22 1405 01/18/22 1955 01/19/22 0446 01/20/22 0613 01/21/22 0510 01/22/22 0525  NA 136  --  142 136 135 138  K 3.9  --  4.1 3.9 4.3 3.7  CL 101  --  106 102 100 98  CO2 27  --  31 30 33* 35*  GLUCOSE 154*  --  152* 129* 136* 144*  BUN 10  --  11 8 10 12  CREATININE 0.70 0.79 0.68 0.60* 0.55* 0.56*  CALCIUM 8.6*  --  8.6* 8.1* 8.4* 9.0  MG  --  2.0  --  1.8  --   --    Liver Function Tests: Recent Labs  Lab 01/18/22 1405 01/19/22 0446 01/21/22 0510 01/22/22 0525  AST 29 23 28 31  ALT 33 29 29 31  ALKPHOS 86 78 70 67  BILITOT 0.7 0.4 0.8 1.0  PROT 5.7* 5.1* 4.7* 5.1*  ALBUMIN 2.9* 2.5* 2.2* 2.4*   Coagulation Profile: Recent Labs  Lab 01/19/22 0446  INR 1.1   CBC: Recent Labs  Lab 01/18/22 1405 01/18/22 1955 01/19/22 0446 01/20/22 0613 01/21/22 0510  WBC 6.6 4.2 4.6 3.3* 5.7  NEUTROABS 3.1  --   --  1.9  --    HGB 13.4 11.6* 12.7* 15.8 11.1*  HCT 39.3 34.4* 38.2* 47.4 33.8*  MCV 115.2* 116.6* 117.2* 115.6* 117.8*  PLT 74* 62* 64* 35* 49*   Urine analysis:    Component Value Date/Time   COLORURINE YELLOW 01/19/2022 0630   APPEARANCEUR CLEAR 01/19/2022 0630   LABSPEC 1.014 01/19/2022 0630   PHURINE 6.0 01/19/2022 0630   GLUCOSEU NEGATIVE 01/19/2022 0630   HGBUR NEGATIVE 01/19/2022 0630   BILIRUBINUR NEGATIVE 01/19/2022 0630   KETONESUR NEGATIVE 01/19/2022 0630   PROTEINUR NEGATIVE 01/19/2022 0630   NITRITE NEGATIVE 01/19/2022 0630   LEUKOCYTESUR NEGATIVE 01/19/2022 0630   Sepsis Labs:  Recent Results (from the past 240 hour(s))  SARS Coronavirus 2 by RT PCR (hospital order, performed in [facility] hospital lab) *cepheid single result test* Anterior Nasal Swab     Status: None   Collection Time: 01/18/22  1:53 PM   Specimen: Anterior Nasal Swab  Result Value Ref Range Status   SARS Coronavirus 2 by RT PCR NEGATIVE NEGATIVE Final    Comment: (NOTE) SARS-CoV-2 target nucleic acids are NOT DETECTED.  The   SARS-CoV-2 RNA is generally detectable in upper and lower respiratory specimens during the acute phase of infection. The lowest concentration of SARS-CoV-2 viral copies this assay can detect is 250 copies / mL. A negative result does not preclude SARS-CoV-2 infection and should not be used as the sole basis for treatment or other patient management decisions.  A negative result may occur with improper specimen collection / handling, submission of specimen other than nasopharyngeal swab, presence of viral mutation(s) within the areas targeted by this assay, and inadequate number of viral copies (<250 copies / mL). A negative result must be combined with clinical observations, patient history, and epidemiological information.  Fact Sheet for Patients:   https://www.patel.info/  Fact Sheet for Healthcare Providers: https://hall.com/  This  test is not yet approved or  cleared by the [place] FDA and has been authorized for detection and/or diagnosis of SARS-CoV-2 by FDA under an Emergency Use Authorization (EUA).  This EUA will remain in effect (meaning this test can be used) for the duration of the COVID-19 declaration under Section 564(b)(1) of the Act, 21 U.S.C. section 360bbb-3(b)(1), unless the authorization is terminated or revoked sooner.  Performed at [facility], [address], [place], [place] [address]   Culture, blood (single)     Status: None (Preliminary result)   Collection Time: 01/18/22  3:04 PM   Specimen: Right Antecubital; Blood  Result Value Ref Range Status   Specimen Description RIGHT ANTECUBITAL  Final   Special Requests   Final    BOTTLES DRAWN AEROBIC ONLY Blood Culture results may not be optimal due to an excessive volume of blood received in culture bottles   Culture   Final    NO GROWTH 2 DAYS Performed at [facility], [address], [place], [place] [address]    Report Status PENDING  Incomplete  MRSA Next Gen by PCR, Nasal     Status: None   Collection Time: 01/19/22  9:36 AM   Specimen: Nasal Mucosa; Nasal Swab  Result Value Ref Range Status   MRSA by PCR Next Gen NOT DETECTED NOT DETECTED Final    Comment: (NOTE) The GeneXpert MRSA Assay (FDA approved for NASAL specimens only), is one component of a comprehensive MRSA colonization surveillance program. It is not intended to diagnose MRSA infection nor to guide or monitor treatment for MRSA infections. Test performance is not FDA approved in patients less than 74 years old. Performed at [facility], [address], [place], [place] [address]   MRSA Next Gen by PCR, Nasal     Status: None   Collection Time: 01/20/22 11:36 PM   Specimen: Nasal Mucosa; Nasal Swab  Result Value Ref Range Status   MRSA by PCR Next Gen NOT DETECTED NOT DETECTED Final    Comment: (NOTE) The GeneXpert MRSA Assay (FDA approved for NASAL specimens  only), is one component of a comprehensive MRSA colonization surveillance program. It is not intended to diagnose MRSA infection nor to guide or monitor treatment for MRSA infections. Test performance is not FDA approved in patients less than 2 years old. Performed at [facility], [address] [place], [place] [address]   Expectorated Sputum Assessment w Gram Stain, Rflx to Resp Cult     Status: None   Collection Time: 01/21/22  3:58 AM   Specimen: Sputum  Result Value Ref Range Status   Specimen Description SPUTUM  Final   Special Requests NONE  Final   Sputum evaluation   Final  THIS SPECIMEN IS ACCEPTABLE FOR SPUTUM CULTURE Performed at [facility], [address] [place], [place] [address]    Report Status 01/22/2022 FINAL  Final  Culture, Respiratory w Gram Stain     Status: None (Preliminary result)   Collection Time: 01/21/22  3:58 AM   Specimen: SPU  Result Value Ref Range Status   Specimen Description   Final    SPUTUM Performed at [facility], [address] [place], [place] [address]    Special Requests   Final    NONE Reflexed from [id number] Performed at [facility], [address] [place], [place] [address]    Gram Stain   Final    FEW SQUAMOUS EPITHELIAL CELLS PRESENT FEW WBC PRESENT,BOTH PMN AND MONONUCLEAR MODERATE YEAST WITH PSEUDOHYPHAE FEW GRAM NEGATIVE RODS FEW GRAM POSITIVE RODS    Culture   Final    CULTURE REINCUBATED FOR BETTER GROWTH Performed at [facility], [address] [place],  [address]    Report Status PENDING  Incomplete     Scheduled Meds:  acyclovir  800 mg Oral BID   budesonide (PULMICORT) nebulizer solution  0.25 mg Nebulization BID   Chlorhexidine Gluconate Cloth  6 each Topical Daily   docusate sodium  100 mg Oral BID   enoxaparin (LOVENOX) injection  40 mg Subcutaneous Q24H   folic acid  1 mg Oral QHS   furosemide  20 mg Intravenous Once   levalbuterol  0.63 mg Nebulization Q6H   melatonin  6 mg Oral  Daily   midodrine  10 mg Oral TID WC    morphine injection  4 mg Intravenous Once   pantoprazole  40 mg Oral BID   PARoxetine  25 mg Oral Daily   predniSONE  40 mg Oral Q breakfast   tiZANidine  4 mg Oral QHS   ursodiol  600 mg Oral BID   Continuous Infusions:  sodium chloride 75 mL/hr at 01/22/22 0749   azithromycin 500 mg (01/22/22 1709)   ceFEPime (MAXIPIME) IV 2 g (01/22/22 1848)    Procedures/Studies: DG Chest Port 1 View  Result Date: 01/21/2022 CLINICAL DATA:  Hypoxia EXAM: PORTABLE CHEST 1 VIEW COMPARISON:  01/19/2022 CTA chest FINDINGS: Severe biapical consolidation. Right chest wall Port-A-Cath tip in the lower SVC. Mild cardiomegaly. IMPRESSION: Severe biapical consolidation. Electronically Signed   By: Kevin  Herman M.D.   On: 01/21/2022 03:40   CT Angio Chest Pulmonary Embolism (PE) W or WO Contrast  Result Date: 01/19/2022 CLINICAL DATA:  Chest pain with shortness of breath. EXAM: CT ANGIOGRAPHY CHEST WITH CONTRAST TECHNIQUE: Multidetector CT imaging of the chest was performed using the standard protocol during bolus administration of intravenous contrast. Multiplanar CT image reconstructions and MIPs were obtained to evaluate the vascular anatomy. RADIATION DOSE REDUCTION: This exam was performed according to the departmental dose-optimization program which includes automated exposure control, adjustment of the mA and/or kV according to patient size and/or use of iterative reconstruction technique. CONTRAST:  75mL OMNIPAQUE IOHEXOL 350 MG/ML SOLN COMPARISON:  None Available. FINDINGS: Cardiovascular: Satisfactory opacification of the pulmonary arteries to the segmental level. No evidence of pulmonary embolism. Normal heart size. There is a small pericardial effusion. Mediastinum/Nodes: There are enlarged right hilar lymph nodes measuring up to 16 mm. There is an enlarged subcarinal lymph node measuring 11 mm. Visualized esophagus and thyroid gland are within normal limits.  Lungs/Pleura: There is bilateral patchy airspace consolidation throughout both lungs most prominent in the bilateral upper lobes, right greater than   left. There are small bilateral pleural effusions. There is some sparing of the lung bases. Trachea and central airways are patent. There is no evidence for pneumothorax. Upper Abdomen: No acute abnormality. Musculoskeletal: There are mild chronic compression deformities of T8 and T9. No acute fractures are seen. Review of the MIP images confirms the above findings. IMPRESSION: 1. No evidence for pulmonary embolism. 2. Small pericardial effusion. 3. Bilateral patchy airspace consolidation most significant in the bilateral upper lobes. Findings may be related to pulmonary edema, hemorrhage, and/or infection. 4. Small pleural effusions. 5. Subcarinal and right hilar lymphadenopathy. Electronically Signed   By: Amy  Guttmann M.D.   On: 01/19/2022 18:00   DG Chest 2 View  Result Date: 01/18/2022 CLINICAL DATA:  CMML, MDS, shortness of breath with cough and wheezing EXAM: CHEST - 2 VIEW COMPARISON:  02/16/2020 FINDINGS: Right subclavian power port catheter tip lower SVC level as before. Lower lung volumes with new bilateral mixed interstitial patchy airspace opacities worse in the upper lobes concerning for pneumonia, including atypical pneumonia. No large effusion or pneumothorax. Trachea midline. Stable mild cardiac enlargement. Degenerative changes noted spine. IMPRESSION: Bilateral mixed interstitial and airspace opacities worse in the upper lobes compatible with pneumonia. See above comment. Electronically Signed   By: M.  Shick M.D.   On: 01/18/2022 13:37    Carlos Madera, MD  [facility]  If 7PM-7AM, please contact night-coverage [email or web address] Password [email or web address] 01/22/2022, 6:50 PM   LOS: 4 days  

## 2022-01-22 NOTE — Progress Notes (Signed)
Patient off Bipap on 15lpm [place]. SPO2 at 94%. Patient comfortable for now.

## 2022-01-23 ENCOUNTER — Inpatient Hospital Stay (HOSPITAL_COMMUNITY): Payer: 59

## 2022-01-23 DIAGNOSIS — C9311 Chronic myelomonocytic leukemia, in remission: Secondary | ICD-10-CM | POA: Diagnosis not present

## 2022-01-23 DIAGNOSIS — J181 Lobar pneumonia, unspecified organism: Secondary | ICD-10-CM | POA: Diagnosis not present

## 2022-01-23 DIAGNOSIS — D46Z Other myelodysplastic syndromes: Secondary | ICD-10-CM | POA: Diagnosis not present

## 2022-01-23 DIAGNOSIS — J9601 Acute respiratory failure with hypoxia: Secondary | ICD-10-CM | POA: Diagnosis not present

## 2022-01-23 LAB — CULTURE, BLOOD (SINGLE): Culture: NO GROWTH

## 2022-01-23 LAB — BASIC METABOLIC PANEL
Anion gap: 7 (ref 5–15)
BUN: 17 mg/dL (ref 6–20)
CO2: 32 mmol/L (ref 22–32)
Calcium: 8.7 mg/dL — ABNORMAL LOW (ref 8.9–10.3)
Chloride: 100 mmol/L (ref 98–111)
Creatinine, Ser: 0.54 mg/dL — ABNORMAL LOW (ref 0.61–1.24)
GFR, Estimated: >60 mL/min (ref >60)
Glucose, Bld: 146 mg/dL — ABNORMAL HIGH (ref 70–99)
Potassium: 3.4 mmol/L — ABNORMAL LOW (ref 3.5–5.1)
Sodium: 139 mmol/L (ref 135–145)

## 2022-01-23 MED ORDER — BUDESONIDE 0.5 MG/2ML IN SUSP
0.5000 mg | Freq: Two times a day (BID) | RESPIRATORY_TRACT | Status: DC
  Administered 2022-01-23 – 2022-01-25 (×5): 0.5 mg via RESPIRATORY_TRACT
  Filled 2022-01-23 (×5): qty 2

## 2022-01-23 MED ORDER — METHYLPREDNISOLONE SODIUM SUCC 40 MG IJ SOLR
40.0000 mg | INTRAMUSCULAR | Status: DC
  Administered 2022-01-24 – 2022-01-25 (×2): 40 mg via INTRAVENOUS
  Filled 2022-01-23 (×2): qty 1

## 2022-01-23 MED ORDER — FUROSEMIDE 10 MG/ML IJ SOLN
20.0000 mg | Freq: Once | INTRAMUSCULAR | Status: AC
  Administered 2022-01-23: 20 mg via INTRAVENOUS
  Filled 2022-01-23: qty 2

## 2022-01-23 MED ORDER — POTASSIUM CHLORIDE CRYS ER 20 MEQ PO TBCR
40.0000 meq | EXTENDED_RELEASE_TABLET | Freq: Once | ORAL | Status: DC
  Filled 2022-01-23: qty 2

## 2022-01-23 MED ORDER — DEXTROMETHORPHAN POLISTIREX ER 30 MG/5ML PO SUER: 30.0000 mg | Freq: Two times a day (BID) | ORAL | Status: DC | PRN

## 2022-01-23 MED ORDER — PANTOPRAZOLE SODIUM 40 MG IV SOLR
40.0000 mg | INTRAVENOUS | Status: DC
  Administered 2022-01-24 – 2022-01-25 (×2): 40 mg via INTRAVENOUS
  Filled 2022-01-23 (×2): qty 10

## 2022-01-23 MED ORDER — POTASSIUM CHLORIDE 10 MEQ/100ML IV SOLN
10.0000 meq | INTRAVENOUS | Status: AC
  Administered 2022-01-23 (×3): 10 meq via INTRAVENOUS
  Filled 2022-01-23 (×3): qty 100

## 2022-01-23 NOTE — Progress Notes (Signed)
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  He has had some posttussive emesis.  He has had subjective fevers and chills.  He has a headache but denies any visual disturbance.  He denies any hemoptysis, hematemesis, abdominal pain, dysuria, hematuria, hematochezia, melena. He went to his PCP on the morning of 01/18/2022.  He was noted to be hypoxic in the low 80s.  He was sent to the emergency department for further evaluation. In the ED, the patient had low-grade temperature of 100.2 F.  He was tachycardic into the 120s.  He was hemodynamically stable with oxygen saturation 84% on room air.  He was placed on 3 L.  WBC 4.6, hemoglobin 12.7, platelets 64,000.  Sodium 142, potassium 4.1, bicarbonate 31, serum creatinine 0.68.  AST 23, ALT 29, alk phos 378, total bilirubin 0.4.  The patient was initially started on ceftriaxone and azithromycin.    Assessment and Plan: Lobar pneumonia ([name]) -Given the patient's immunocompromise state and frequent hospitalizations--continue broad IV antibiotics -Follow culture results -MRSA screen negative.  Acute respiratory failure with hypoxia (HCC) -Presented with tachypnea and hypoxia 84% on room air at time of admission. -Patient in the requiring BiPAP overnight; high flow nasal cannula supplementation at time of today's evaluation. -Continue to wean oxygen supplementation as tolerated -Continue resting.  With BiPAP at night. -Repeat chest x-ray demonstrated improvement in variation -Continue intermittent dosages of IV Lasix and follow volume status. -Continue maintaining adequate hydration orally; IV fluids at this moment discontinue. -Continue following closely I's and O's and daily  weights. -Continue current antibiotic therapy. -Continue the use of flutter valve and antitussive medication.  Severe sepsis ([name]) -Presented with tachypnea, tachycardia, and respiratory failure with hypoxia -Secondary to pneumonia -Lactic acid peaked 1.4 PCT 0.13 -Continue to maintain adequate hydration; now orally at that is presence of vascular congestion and worsening respiratory distress and what appears to be interstitial edema. -IV Lasix x1 around 1800 on 01/23/22 will be provided. -Chest x-ray demonstrated slight improvement in aeration. -Continue weaning of oxygen supplementation as tolerated. -Continue supportive care and as needed use of needed antipyretics.  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 -At this moment will hold Lovenox -SCDs for DVT prophylaxis.   08/25/2021 Biopsy  Variably hypercellular marrow (30-70%) with erythroid hyperplasia, granulocytic hypoplasia, T-cell large granular lymphocytosis, and no increase in blasts. Flow shows no distinct blas population and T-cell large granular lymphocytosis (51% of T cells). Chimerism 99.8% donor.  07/17/2020 - 07/24/2020 Chemotherapy  INPT ONC BMT Adult Allogeneic Transplant Fludarabine / Busulfan (Myeloablative   Family Communication:   Daughter updated at bedside.  Consultants:  none  Code Status:  FULL   DVT Prophylaxis:  SCDs   Procedures: As Listed in Progress Note Above  Antibiotics: Cefepime 9/12>> Azithro 9/11>>   Subjective: Overnight requiring the use of BiPAP; continued to experience intermittent  coughing spells and short winded sensation with minimal activity.  High flow nasal cannula supplementation at  time of today's evaluation.  Very low oxygen reserve.  Objective: Vitals:   01/23/22 0500 01/23/22 0600 01/23/22 0700 01/23/22 0733  BP: 114/66 117/67 120/62   Pulse: 81 73 75   Resp: (!) 23 (!) 25    Temp:    (!) 97.1 F (36.2 C)  TempSrc:    Axillary  SpO2: 93% 95% 93% 95%  Weight:      Height:        Intake/Output Summary (Last 24 hours) at 01/23/2022 1017 Last data filed at 01/22/2022 1900 Gross per 24 hour  Intake --  Output 675 ml  Net -675 ml   Weight change:   Exam: General exam: Alert, awake, oriented x 3; still short winded with minimal activity requiring high flow nasal cannula supplementation.  Overnight also requiring BiPAP.  No fever.  Complaining of intermittent coughing spells. Respiratory system: Decreased breath sounds at the bases, positive tachypnea appreciated.  Rhonchi and mild expiratory wheezing present on auscultation. Cardiovascular system:RRR.  No rubs, no gallops, no JVD. Gastrointestinal system: Abdomen is nondistended, soft and nontender. No organomegaly or masses felt. Normal bowel sounds heard. Central nervous system: Alert and oriented. No focal neurological deficits. Extremities: No cyanosis or clubbing. Skin: No petechiae. Psychiatry: Judgement and insight appear normal. Mood & affect appropriate.   Data Reviewed: I have personally reviewed following labs and imaging studies  Basic Metabolic Panel: Recent Labs  Lab 01/18/22 1955 01/19/22 0446 01/20/22 0613 01/21/22 0510 01/22/22 0525 01/23/22 0400  NA  --  142 136 135 138 139  K  --  4.1 3.9 4.3 3.7 3.4*  CL  --  106 102 100 98 100  CO2  --  31 30 33* 35* 32  GLUCOSE  --  152* 129* 136* 144* 146*  BUN  --  11 8 10 12 17   CREATININE 0.79 0.68 0.60* 0.55* 0.56* 0.54*  CALCIUM  --  8.6* 8.1* 8.4* 9.0 8.7*  MG 2.0  --  1.8  --   --   --    Liver Function Tests: Recent Labs   Lab 01/18/22 1405 01/19/22 0446 01/21/22 0510 01/22/22 0525  AST 29 23 28 31   ALT 33 29 29 31   ALKPHOS 86 78 70 67  BILITOT 0.7 0.4 0.8 1.0  PROT 5.7* 5.1* 4.7* 5.1*  ALBUMIN 2.9* 2.5* 2.2* 2.4*   Coagulation Profile: Recent Labs  Lab 01/19/22 0446  INR 1.1   CBC: Recent Labs  Lab 01/18/22 1405 01/18/22 1955 01/19/22 0446 01/20/22 0613 01/21/22 0510  WBC 6.6 4.2 4.6 3.3* 5.7  NEUTROABS 3.1  --   --  1.9  --   HGB 13.4 11.6* 12.7* 15.8 11.1*  HCT 39.3 34.4* 38.2* 47.4 33.8*  MCV 115.2* 116.6* 117.2* 115.6* 117.8*  PLT 74* 62* 64* 35* 49*   Urine analysis:    Component Value Date/Time   COLORURINE YELLOW 01/19/2022 0630   APPEARANCEUR CLEAR 01/19/2022 0630   LABSPEC 1.014 01/19/2022 0630   PHURINE 6.0 01/19/2022 0630   GLUCOSEU NEGATIVE 01/19/2022 0630   HGBUR NEGATIVE 01/19/2022 0630   BILIRUBINUR NEGATIVE 01/19/2022 0630   KETONESUR NEGATIVE 01/19/2022 0630   PROTEINUR NEGATIVE 01/19/2022 0630   NITRITE NEGATIVE 01/19/2022 0630   LEUKOCYTESUR NEGATIVE 01/19/2022 0630   Sepsis Labs:  Recent Results (from the past 240 hour(s))  SARS Coronavirus 2 by RT PCR (hospital order, performed in [facility] hospital lab) *cepheid single result test* Anterior Nasal Swab     Status: None   Collection  Time: 01/18/22  1:53 PM   Specimen: Anterior Nasal Swab  Result Value Ref Range Status   SARS Coronavirus 2 by RT PCR NEGATIVE NEGATIVE Final    Comment: (NOTE) SARS-CoV-2 target nucleic acids are NOT DETECTED.  The SARS-CoV-2 RNA is generally detectable in upper and lower respiratory specimens during the acute phase of infection. The lowest concentration of SARS-CoV-2 viral copies this assay can detect is 250 copies / mL. A negative result does not preclude SARS-CoV-2 infection and should not be used as the sole basis for treatment or other patient management decisions.  A negative result may occur with improper specimen collection / handling, submission of specimen  other than nasopharyngeal swab, presence of viral mutation(s) within the areas targeted by this assay, and inadequate number of viral copies (<250 copies / mL). A negative result must be combined with clinical observations, patient history, and epidemiological information.  Fact Sheet for Patients:   https://www.patel.info/  Fact Sheet for Healthcare Providers: https://hall.com/  This test is not yet approved or  cleared by the [place] FDA and has been authorized for detection and/or diagnosis of SARS-CoV-2 by FDA under an Emergency Use Authorization (EUA).  This EUA will remain in effect (meaning this test can be used) for the duration of the COVID-19 declaration under Section 564(b)(1) of the Act, 21 U.S.C. section 360bbb-3(b)(1), unless the authorization is terminated or revoked sooner.  Performed at [facility], [address], [place], [place] [address]   Culture, blood (single)     Status: None   Collection Time: 01/18/22  3:04 PM   Specimen: Right Antecubital; Blood  Result Value Ref Range Status   Specimen Description RIGHT ANTECUBITAL  Final   Special Requests   Final    BOTTLES DRAWN AEROBIC ONLY Blood Culture results may not be optimal due to an excessive volume of blood received in culture bottles   Culture   Final    NO GROWTH 5 DAYS Performed at [facility], [address] [place], [place] [address]    Report Status 01/23/2022 FINAL  Final  MRSA Next Gen by PCR, Nasal     Status: None   Collection Time: 01/19/22  9:36 AM   Specimen: Nasal Mucosa; Nasal Swab  Result Value Ref Range Status   MRSA by PCR Next Gen NOT DETECTED NOT DETECTED Final    Comment: (NOTE) The GeneXpert MRSA Assay (FDA approved for NASAL specimens only), is one component of a comprehensive MRSA colonization surveillance program. It is not intended to diagnose MRSA infection nor to guide or monitor treatment for MRSA infections. Test  performance is not FDA approved in patients less than 52 years old. Performed at [facility], [address] [place], [place] [address]   MRSA Next Gen by PCR, Nasal     Status: None   Collection Time: 01/20/22 11:36 PM   Specimen: Nasal Mucosa; Nasal Swab  Result Value Ref Range Status   MRSA by PCR Next Gen NOT DETECTED NOT DETECTED Final    Comment: (NOTE) The GeneXpert MRSA Assay (FDA approved for NASAL specimens only), is one component of a comprehensive MRSA colonization surveillance program. It is not intended to diagnose MRSA infection nor to guide or monitor treatment for MRSA infections. Test performance is not FDA approved in patients less than 58 years old. Performed at [facility], [address], [place],  [address]   Expectorated Sputum Assessment w Gram Stain, Rflx to Resp Cult     Status: None  Collection Time: 01/21/22  3:58 AM   Specimen: Sputum  Result Value Ref Range Status   Specimen Description SPUTUM  Final   Special Requests NONE  Final   Sputum evaluation   Final    THIS SPECIMEN IS ACCEPTABLE FOR SPUTUM CULTURE Performed at [facility], [address] [place], [place] [address]    Report Status 01/22/2022 FINAL  Final  Culture, Respiratory w Gram Stain     Status: None (Preliminary result)   Collection Time: 01/21/22  3:58 AM   Specimen: SPU  Result Value Ref Range Status   Specimen Description   Final    SPUTUM Performed at [facility], [address], [place], [place] [address]    Special Requests   Final    NONE Reflexed from [id number] Performed at [facility], [address], [place], [place] [address]    Gram Stain   Final    FEW SQUAMOUS EPITHELIAL CELLS PRESENT FEW WBC PRESENT,BOTH PMN AND MONONUCLEAR MODERATE YEAST WITH PSEUDOHYPHAE FEW GRAM NEGATIVE RODS FEW GRAM POSITIVE RODS    Culture   Final    CULTURE REINCUBATED FOR BETTER GROWTH Performed at [facility], [place] [address] [place], [place] [address]    Report  Status PENDING  Incomplete     Scheduled Meds:  acyclovir  800 mg Oral BID   budesonide (PULMICORT) nebulizer solution  0.5 mg Nebulization BID   Chlorhexidine Gluconate Cloth  6 each Topical Daily   docusate sodium  100 mg Oral BID   folic acid  1 mg Oral QHS   furosemide  20 mg Intravenous Once   levalbuterol  0.63 mg Nebulization Q6H   melatonin  6 mg Oral Daily   midodrine  10 mg Oral TID WC    morphine injection  4 mg Intravenous Once   pantoprazole  40 mg Oral BID   PARoxetine  25 mg Oral Daily   potassium chloride  40 mEq Oral Once   predniSONE  40 mg Oral Q breakfast   tiZANidine  4 mg Oral QHS   ursodiol  600 mg Oral BID   Continuous Infusions:  azithromycin 500 mg (01/22/22 1709)   ceFEPime (MAXIPIME) IV 2 g (01/23/22 0235)    Procedures/Studies: DG CHEST PORT 1 VIEW  Result Date: 01/23/2022 CLINICAL DATA:  History of myelodysplastic syndrome. Status post bone marrow transplant. Worsening cough and shortness of breath. EXAM: PORTABLE CHEST 1 VIEW COMPARISON:  01/21/2022 FINDINGS: Right chest wall port a catheter noted with tip at the cavoatrial junction. Stable cardiomediastinal contours. Unchanged small left pleural effusion. Bilateral upper lung zone predominant interstitial and airspace opacities are again noted. Compared with the previous exam the aeration to the upper lung zones appears slightly improved in the interval. IMPRESSION: Slight improvement in aeration to the upper lung zones compared with previous exam. Electronically Signed   By: Kerby Moors M.D.   On: 01/23/2022 09:16   DG Chest Port 1 View  Result Date: 01/21/2022 CLINICAL DATA:  Hypoxia EXAM: PORTABLE CHEST 1 VIEW COMPARISON:  01/19/2022 CTA chest FINDINGS: Severe biapical consolidation. Right chest wall Port-A-Cath tip in the lower SVC. Mild cardiomegaly. IMPRESSION: Severe biapical consolidation. Electronically Signed   By: Ulyses Jarred M.D.   On: 01/21/2022 03:40   CT Angio Chest Pulmonary  Embolism (PE) W or WO Contrast  Result Date: 01/19/2022 CLINICAL DATA:  Chest pain with shortness of breath. EXAM: CT ANGIOGRAPHY CHEST WITH CONTRAST TECHNIQUE: Multidetector CT imaging of the chest was performed  using the standard protocol during bolus administration of intravenous contrast. Multiplanar CT image reconstructions and MIPs were obtained to evaluate the vascular anatomy. RADIATION DOSE REDUCTION: This exam was performed according to the departmental dose-optimization program which includes automated exposure control, adjustment of the mA and/or kV according to patient size and/or use of iterative reconstruction technique. CONTRAST:  6m OMNIPAQUE IOHEXOL 350 MG/ML SOLN COMPARISON:  None Available. FINDINGS: Cardiovascular: Satisfactory opacification of the pulmonary arteries to the segmental level. No evidence of pulmonary embolism. Normal heart size. There is a small pericardial effusion. Mediastinum/Nodes: There are enlarged right hilar lymph nodes measuring up to 16 mm. There is an enlarged subcarinal lymph node measuring 11 mm. Visualized esophagus and thyroid gland are within normal limits. Lungs/Pleura: There is bilateral patchy airspace consolidation throughout both lungs most prominent in the bilateral upper lobes, right greater than left. There are small bilateral pleural effusions. There is some sparing of the lung bases. Trachea and central airways are patent. There is no evidence for pneumothorax. Upper Abdomen: No acute abnormality. Musculoskeletal: There are mild chronic compression deformities of T8 and T9. No acute fractures are seen. Review of the MIP images confirms the above findings. IMPRESSION: 1. No evidence for pulmonary embolism. 2. Small pericardial effusion. 3. Bilateral patchy airspace consolidation most significant in the bilateral upper lobes. Findings may be related to pulmonary edema, hemorrhage, and/or infection. 4. Small pleural effusions. 5. Subcarinal and right  hilar lymphadenopathy. Electronically Signed   By: ARonney AstersM.D.   On: 01/19/2022 18:00   DG Chest 2 View  Result Date: 01/18/2022 CLINICAL DATA:  CMML, MDS, shortness of breath with cough and wheezing EXAM: CHEST - 2 VIEW COMPARISON:  02/16/2020 FINDINGS: Right subclavian power port catheter tip lower SVC level as before. Lower lung volumes with new bilateral mixed interstitial patchy airspace opacities worse in the upper lobes concerning for pneumonia, including atypical pneumonia. No large effusion or pneumothorax. Trachea midline. Stable mild cardiac enlargement. Degenerative changes noted spine. IMPRESSION: Bilateral mixed interstitial and airspace opacities worse in the upper lobes compatible with pneumonia. See above comment. Electronically Signed   By: MJerilynn Mages  Shick M.D.   On: 01/18/2022 13:37    CBarton Dubois MD  [facility]  If 7PM-7AM, please contact night-coverage [email or web address] Password [email or web address] 01/23/2022, 10:17 AM   LOS: 5 days

## 2022-01-23 NOTE — Progress Notes (Signed)
Patient took AM PO pills and began coughing/choking after taking a drink of water after already swallowing the pills. Patient stated, "the water went down the wrong tube." Patient clarified that he did NOT choke on any pills and they were already swallowed prior to taking another drink of water after swallowing them. Patient desated to 75% on the heated high flow and was having difficulty bringing O2 sat back up above 88%. Notified Respiratory Therapist and Dr Dyann Kief who both came to patient room to assess. Lung sounds unchanged from 0800 assessment. Patient able to talk but kept coughing. Patient is currently ok with O2 sat 92% on the heated high flow and currently has no complaints or in any distress.

## 2022-01-23 NOTE — Progress Notes (Signed)
Removed patient from Bipap and placed on HHFNC 25Lpm and 100%. Patient is going to attempt to eat some breakfast. RN at bedside.

## 2022-01-24 DIAGNOSIS — J181 Lobar pneumonia, unspecified organism: Secondary | ICD-10-CM | POA: Diagnosis not present

## 2022-01-24 DIAGNOSIS — D46Z Other myelodysplastic syndromes: Secondary | ICD-10-CM | POA: Diagnosis not present

## 2022-01-24 DIAGNOSIS — J9601 Acute respiratory failure with hypoxia: Secondary | ICD-10-CM | POA: Diagnosis not present

## 2022-01-24 DIAGNOSIS — C9311 Chronic myelomonocytic leukemia, in remission: Secondary | ICD-10-CM | POA: Diagnosis not present

## 2022-01-24 MED ORDER — LORATADINE 10 MG PO TABS
10.0000 mg | ORAL_TABLET | Freq: Every day | ORAL | Status: DC
  Administered 2022-01-24 – 2022-01-25 (×2): 10 mg via ORAL
  Filled 2022-01-24 (×2): qty 1

## 2022-01-24 NOTE — Progress Notes (Signed)
Took patient off Bipap and placed back on [place] at patient's request.  Patient on 25L, 100%.  Patient had coughing spell after taking off Bipap, sat dropped to 84%.  Encouraged deep breaths in through nose and out through mouth, Sat currently at 91% on HHFNC.

## 2022-01-24 NOTE — Progress Notes (Signed)
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  He has had some posttussive emesis.  He has had subjective fevers and chills.  He has a headache but denies any visual disturbance.  He denies any hemoptysis, hematemesis, abdominal pain, dysuria, hematuria, hematochezia, melena. He went to his PCP on the morning of 01/18/2022.  He was noted to be hypoxic in the low 80s.  He was sent to the emergency department for further evaluation. In the ED, the patient had low-grade temperature of 100.2 F.  He was tachycardic into the 120s.  He was hemodynamically stable with oxygen saturation 84% on room air.  He was placed on 3 L.  WBC 4.6, hemoglobin 12.7, platelets 64,000.  Sodium 142, potassium 4.1, bicarbonate 31, serum creatinine 0.68.  AST 23, ALT 29, alk phos 378, total bilirubin 0.4.  The patient was initially started on ceftriaxone and azithromycin.    Assessment and Plan: Lobar pneumonia ([place]) -Given the patient's immunocompromise state and frequent hospitalizations--continue broad IV antibiotics -Follow culture results -MRSA screen negative.  Acute respiratory failure with hypoxia (HCC) -Presented with tachypnea and hypoxia 84% on room air at time of admission. -Patient in the requiring BiPAP overnight; high flow nasal cannula supplementation at time of today's evaluation. -Continue to wean oxygen supplementation as tolerated -Continue resting.  With BiPAP at night. -Repeat chest x-ray demonstrated improvement in variation -Continue intermittent dosages of IV Lasix and follow volume status. -Continue maintaining adequate hydration orally; IV fluids at this moment discontinue. -Continue following closely I's and O's and daily  weights. -Continue current antibiotic therapy. -Continue the use of flutter valve and antitussive medication.  Severe sepsis ([place]) -Presented with tachypnea, tachycardia, and respiratory failure with hypoxia -Secondary to pneumonia -Lactic acid peaked 1.4 PCT 0.13 -Continue to maintain adequate hydration; now orally at that is presence of vascular congestion and worsening respiratory distress and what appears to be interstitial edema. -IV Lasix x1 around 1800 on 01/23/22 will be provided. -Chest x-ray demonstrated slight improvement in aeration. -Continue weaning of oxygen supplementation as tolerated. -Continue supportive care and as needed use of needed antipyretics.  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 -At this moment will hold Lovenox -SCDs for DVT prophylaxis.   08/25/2021 Biopsy  Variably hypercellular marrow (30-70%) with erythroid hyperplasia, granulocytic hypoplasia, T-cell large granular lymphocytosis, and no increase in blasts. Flow shows no distinct blas population and T-cell large granular lymphocytosis (51% of T cells). Chimerism 99.8% donor.  07/17/2020 - 07/24/2020 Chemotherapy  INPT ONC BMT Adult Allogeneic Transplant Fludarabine / Busulfan (Myeloablative   Family Communication:   Daughter updated at bedside.  Consultants:  none  Code Status:  FULL   DVT Prophylaxis:  SCDs   Procedures: As Listed in Progress Note Above  Antibiotics: Cefepime 9/12>> Azithro 9/11>>   Subjective: Requiring the use of BiPAP overnight; still easily desaturating and  experiencing tachypnea with minimal exertion.  25 L nasal cannula supplementation in place.  No fever,  no chest pain, no nausea vomiting.  Reports intermittent coughing spells.  Patient is congested and is expressing headaches from coughing.  Objective: Vitals:   01/24/22 1200 01/24/22 1300 01/24/22 1400 01/24/22 1500  BP: 131/70 129/62 125/66 130/84  Pulse: 93 84 90 100  Resp: (!) 34 (!) 35 (!) 41 (!) 31  Temp:    98.8 F (37.1 C)  TempSrc:    Oral  SpO2: 92% 91% 92% 91%  Weight:      Height:        Intake/Output Summary (Last 24 hours) at 01/24/2022 1654 Last data filed at 01/24/2022 1600 Gross per 24 hour  Intake 2062.61 ml  Output 2550 ml  Net -487.39 ml   Weight change:   Exam: General exam: Alert, awake, oriented x 3; frail, weak and experiencing tachypnea with minimal exertion.  Required BiPAP overnight.  No fever, no chest pain. Respiratory system: Bilateral rhonchi appreciated on exam; no wheezing.  Decreased breath sounds at the bases. Cardiovascular system:RRR. No rubs or gallops. Gastrointestinal system: Abdomen is nondistended, soft and nontender. No organomegaly or masses felt. Normal bowel sounds heard. Central nervous system: Alert and oriented. No focal neurological deficits. Extremities: No cyanosis, clubbing or edema. Skin: No petechiae. Psychiatry: Judgement and insight appear normal.  Flat affect.  Data Reviewed: I have personally reviewed following labs and imaging studies  Basic Metabolic Panel: Recent Labs  Lab 01/18/22 1955 01/19/22 0446 01/20/22 0613 01/21/22 0510 01/22/22 0525 01/23/22 0400  NA  --  142 136 135 138 139  K  --  4.1 3.9 4.3 3.7 3.4*  CL  --  106 102 100 98 100  CO2  --  31 30 33* 35* 32  GLUCOSE  --  152* 129* 136* 144* 146*  BUN  --  11 8 10 12 17   CREATININE 0.79 0.68 0.60* 0.55* 0.56* 0.54*  CALCIUM  --  8.6* 8.1* 8.4* 9.0 8.7*  MG 2.0  --  1.8  --   --   --    Liver Function Tests: Recent Labs  Lab 01/18/22 1405  01/19/22 0446 01/21/22 0510 01/22/22 0525  AST 29 23 28 31   ALT 33 29 29 31   ALKPHOS 86 78 70 67  BILITOT 0.7 0.4 0.8 1.0  PROT 5.7* 5.1* 4.7* 5.1*  ALBUMIN 2.9* 2.5* 2.2* 2.4*   Coagulation Profile: Recent Labs  Lab 01/19/22 0446  INR 1.1   CBC: Recent Labs  Lab 01/18/22 1405 01/18/22 1955 01/19/22 0446 01/20/22 0613 01/21/22 0510  WBC 6.6 4.2 4.6 3.3* 5.7  NEUTROABS 3.1  --   --  1.9  --   HGB 13.4 11.6* 12.7* 15.8 11.1*  HCT 39.3 34.4* 38.2* 47.4 33.8*  MCV 115.2* 116.6* 117.2* 115.6* 117.8*  PLT 74* 62* 64* 35* 49*   Urine analysis:    Component Value Date/Time   COLORURINE YELLOW 01/19/2022 0630   APPEARANCEUR CLEAR 01/19/2022 0630   LABSPEC 1.014 01/19/2022 0630   PHURINE 6.0 01/19/2022 0630   GLUCOSEU NEGATIVE 01/19/2022 0630   HGBUR NEGATIVE 01/19/2022 0630   BILIRUBINUR NEGATIVE 01/19/2022 0630   KETONESUR NEGATIVE 01/19/2022 0630   PROTEINUR NEGATIVE 01/19/2022 0630   NITRITE NEGATIVE 01/19/2022 0630   LEUKOCYTESUR NEGATIVE 01/19/2022 0630   Sepsis Labs:  Recent Results (from the past 240 hour(s))  SARS Coronavirus 2 by RT PCR (hospital order, performed in [facility] hospital lab) *cepheid single result test* Anterior Nasal Swab     Status: None   Collection Time: 01/18/22  1:53 PM   Specimen: Anterior Nasal Swab  Result Value Ref Range Status   SARS Coronavirus 2 by RT PCR NEGATIVE NEGATIVE Final    Comment: (NOTE) SARS-CoV-2 target nucleic acids are NOT DETECTED.  The SARS-CoV-2 RNA is generally detectable in upper and lower respiratory specimens during the acute phase of infection. The lowest concentration of SARS-CoV-2 viral copies this assay can detect is 250 copies / mL. A negative result does not preclude SARS-CoV-2 infection and should not be used as the sole basis for treatment or other patient management decisions.  A negative result may occur with improper specimen collection / handling, submission of specimen other than  nasopharyngeal swab, presence of viral mutation(s) within the areas targeted by this assay, and inadequate number of viral copies (<250 copies / mL). A negative result must be combined with clinical observations, patient history, and epidemiological information.  Fact Sheet for Patients:   https://www.patel.info/  Fact Sheet for Healthcare Providers: https://hall.com/  This test is not yet approved or  cleared by the [place] FDA and has been authorized for detection and/or diagnosis of SARS-CoV-2 by FDA under an Emergency Use Authorization (EUA).  This EUA will remain in effect (meaning this test can be used) for the duration of the COVID-19 declaration under Section 564(b)(1) of the Act, 21 U.S.C. section 360bbb-3(b)(1), unless the authorization is terminated or revoked sooner.  Performed at [facility], [address], [place], [place] [address]   Culture, blood (single)     Status: None   Collection Time: 01/18/22  3:04 PM   Specimen: Right Antecubital; Blood  Result Value Ref Range Status   Specimen Description RIGHT ANTECUBITAL  Final   Special Requests   Final    BOTTLES DRAWN AEROBIC ONLY Blood Culture results may not be optimal due to an excessive volume of blood received in culture bottles   Culture   Final    NO GROWTH 5 DAYS Performed at [facility], [address], [place], [place] [address]    Report Status 01/23/2022 FINAL  Final  MRSA Next Gen by PCR, Nasal     Status: None   Collection Time: 01/19/22  9:36 AM   Specimen: Nasal Mucosa; Nasal Swab  Result Value Ref Range Status   MRSA by PCR Next Gen NOT DETECTED NOT DETECTED Final    Comment: (NOTE) The GeneXpert MRSA Assay (FDA approved for NASAL specimens only), is one component of a comprehensive MRSA colonization surveillance program. It is not intended to diagnose MRSA infection nor to guide or monitor treatment for MRSA infections. Test performance is not  FDA approved in patients less than 57 years old. Performed at [facility], [address], [place], [place] [address]   MRSA Next Gen by PCR, Nasal     Status: None   Collection Time: 01/20/22 11:36 PM   Specimen: Nasal Mucosa; Nasal Swab  Result Value Ref Range Status   MRSA by PCR Next Gen NOT DETECTED NOT DETECTED Final    Comment: (NOTE) The GeneXpert MRSA Assay (FDA approved for NASAL specimens only), is one component of a comprehensive MRSA colonization surveillance program. It is not intended to diagnose MRSA infection nor to guide or monitor treatment for MRSA infections. Test performance is not FDA approved in patients less than 44 years old. Performed at [facility], [address] [place], [place] [address]   Expectorated Sputum Assessment w Gram Stain, Rflx to Resp Cult     Status: None   Collection Time:  01/21/22  3:58 AM   Specimen: Sputum  Result Value Ref Range Status   Specimen Description SPUTUM  Final   Special Requests NONE  Final   Sputum evaluation   Final    THIS SPECIMEN IS ACCEPTABLE FOR SPUTUM CULTURE Performed at [facility], [address], [place], [place] [address]    Report Status 01/22/2022 FINAL  Final  Culture, Respiratory w Gram Stain     Status: None (Preliminary result)   Collection Time: 01/21/22  3:58 AM   Specimen: SPU  Result Value Ref Range Status   Specimen Description   Final    SPUTUM Performed at [facility], [address] [place], [place] [address]    Special Requests   Final    NONE Reflexed from [id number] Performed at [facility], [address], [place], [place] [address]    Gram Stain   Final    FEW SQUAMOUS EPITHELIAL CELLS PRESENT FEW WBC PRESENT,BOTH PMN AND MONONUCLEAR MODERATE YEAST WITH PSEUDOHYPHAE FEW GRAM NEGATIVE RODS FEW GRAM POSITIVE RODS    Culture   Final    MODERATE CANDIDA ALBICANS NO STAPHYLOCOCCUS AUREUS ISOLATED No Pseudomonas species isolated Performed at [facility], [place] [address],  [place], [place] [address]    Report Status PENDING  Incomplete     Scheduled Meds:  acyclovir  800 mg Oral BID   budesonide (PULMICORT) nebulizer solution  0.5 mg Nebulization BID   Chlorhexidine Gluconate Cloth  6 each Topical Daily   docusate sodium  100 mg Oral BID   folic acid  1 mg Oral QHS   levalbuterol  0.63 mg Nebulization Q6H   loratadine  10 mg Oral Daily   melatonin  6 mg Oral Daily   methylPREDNISolone (SOLU-MEDROL) injection  40 mg Intravenous Q24H   midodrine  10 mg Oral TID WC    morphine injection  4 mg Intravenous Once   pantoprazole (PROTONIX) IV  40 mg Intravenous Q24H   PARoxetine  25 mg Oral Daily   tiZANidine  4 mg Oral QHS   ursodiol  600 mg Oral BID   Continuous Infusions:  ceFEPime (MAXIPIME) IV 2 g (01/24/22 1000)    Procedures/Studies: DG CHEST PORT 1 VIEW  Result Date: 01/23/2022 CLINICAL DATA:  History of myelodysplastic syndrome. Status post bone marrow transplant. Worsening cough and shortness of breath. EXAM: PORTABLE CHEST 1 VIEW COMPARISON:  01/21/2022 FINDINGS: Right chest wall port a catheter noted with tip at the cavoatrial junction. Stable cardiomediastinal contours. Unchanged small left pleural effusion. Bilateral upper lung zone predominant interstitial and airspace opacities are again noted. Compared with the previous exam the aeration to the upper lung zones appears slightly improved in the interval. IMPRESSION: Slight improvement in aeration to the upper lung zones compared with previous exam. Electronically Signed   By: Kerby Moors M.D.   On: 01/23/2022 09:16   DG Chest Port 1 View  Result Date: 01/21/2022 CLINICAL DATA:  Hypoxia EXAM: PORTABLE CHEST 1 VIEW COMPARISON:  01/19/2022 CTA chest FINDINGS: Severe biapical consolidation. Right chest wall Port-A-Cath tip in the lower SVC. Mild cardiomegaly. IMPRESSION: Severe biapical consolidation. Electronically Signed   By: Ulyses Jarred M.D.   On: 01/21/2022 03:40   CT Angio Chest Pulmonary  Embolism (PE) W or WO Contrast  Result Date: 01/19/2022 CLINICAL DATA:  Chest pain with shortness of breath. EXAM: CT ANGIOGRAPHY CHEST WITH CONTRAST TECHNIQUE: Multidetector CT imaging of the chest was performed using the standard protocol during bolus administration of intravenous  contrast. Multiplanar CT image reconstructions and MIPs were obtained to evaluate the vascular anatomy. RADIATION DOSE REDUCTION: This exam was performed according to the departmental dose-optimization program which includes automated exposure control, adjustment of the mA and/or kV according to patient size and/or use of iterative reconstruction technique. CONTRAST:  19m OMNIPAQUE IOHEXOL 350 MG/ML SOLN COMPARISON:  None Available. FINDINGS: Cardiovascular: Satisfactory opacification of the pulmonary arteries to the segmental level. No evidence of pulmonary embolism. Normal heart size. There is a small pericardial effusion. Mediastinum/Nodes: There are enlarged right hilar lymph nodes measuring up to 16 mm. There is an enlarged subcarinal lymph node measuring 11 mm. Visualized esophagus and thyroid gland are within normal limits. Lungs/Pleura: There is bilateral patchy airspace consolidation throughout both lungs most prominent in the bilateral upper lobes, right greater than left. There are small bilateral pleural effusions. There is some sparing of the lung bases. Trachea and central airways are patent. There is no evidence for pneumothorax. Upper Abdomen: No acute abnormality. Musculoskeletal: There are mild chronic compression deformities of T8 and T9. No acute fractures are seen. Review of the MIP images confirms the above findings. IMPRESSION: 1. No evidence for pulmonary embolism. 2. Small pericardial effusion. 3. Bilateral patchy airspace consolidation most significant in the bilateral upper lobes. Findings may be related to pulmonary edema, hemorrhage, and/or infection. 4. Small pleural effusions. 5. Subcarinal and right  hilar lymphadenopathy. Electronically Signed   By: ARonney AstersM.D.   On: 01/19/2022 18:00   DG Chest 2 View  Result Date: 01/18/2022 CLINICAL DATA:  CMML, MDS, shortness of breath with cough and wheezing EXAM: CHEST - 2 VIEW COMPARISON:  02/16/2020 FINDINGS: Right subclavian power port catheter tip lower SVC level as before. Lower lung volumes with new bilateral mixed interstitial patchy airspace opacities worse in the upper lobes concerning for pneumonia, including atypical pneumonia. No large effusion or pneumothorax. Trachea midline. Stable mild cardiac enlargement. Degenerative changes noted spine. IMPRESSION: Bilateral mixed interstitial and airspace opacities worse in the upper lobes compatible with pneumonia. See above comment. Electronically Signed   By: MJerilynn Mages  Shick M.D.   On: 01/18/2022 13:37    CBarton Dubois MD  [facility]  If 7PM-7AM, please contact night-coverage [email or web address] Password [email or web address] 01/24/2022, 4:54 PM   LOS: 6 days

## 2022-01-25 ENCOUNTER — Inpatient Hospital Stay (HOSPITAL_COMMUNITY): Payer: 59

## 2022-01-25 ENCOUNTER — Inpatient Hospital Stay (HOSPITAL_COMMUNITY): Payer: 59 | Admitting: Certified Registered Nurse Anesthetist

## 2022-01-25 DIAGNOSIS — Z87891 Personal history of nicotine dependence: Secondary | ICD-10-CM

## 2022-01-25 DIAGNOSIS — J80 Acute respiratory distress syndrome: Secondary | ICD-10-CM

## 2022-01-25 DIAGNOSIS — J189 Pneumonia, unspecified organism: Secondary | ICD-10-CM

## 2022-01-25 DIAGNOSIS — D46Z Other myelodysplastic syndromes: Secondary | ICD-10-CM | POA: Diagnosis not present

## 2022-01-25 DIAGNOSIS — J9601 Acute respiratory failure with hypoxia: Secondary | ICD-10-CM | POA: Diagnosis not present

## 2022-01-25 DIAGNOSIS — J181 Lobar pneumonia, unspecified organism: Secondary | ICD-10-CM | POA: Diagnosis not present

## 2022-01-25 DIAGNOSIS — C9311 Chronic myelomonocytic leukemia, in remission: Secondary | ICD-10-CM | POA: Diagnosis not present

## 2022-01-25 LAB — GLUCOSE, CAPILLARY
Glucose-Capillary: 155 mg/dL — ABNORMAL HIGH (ref 70–99)
Glucose-Capillary: 167 mg/dL — ABNORMAL HIGH (ref 70–99)
Glucose-Capillary: 177 mg/dL — ABNORMAL HIGH (ref 70–99)

## 2022-01-25 LAB — CBC
HCT: 32.8 % — ABNORMAL LOW (ref 39.0–52.0)
Hemoglobin: 10.8 g/dL — ABNORMAL LOW (ref 13.0–17.0)
MCH: 38.4 pg — ABNORMAL HIGH (ref 26.0–34.0)
MCHC: 32.9 g/dL (ref 30.0–36.0)
MCV: 116.7 fL — ABNORMAL HIGH (ref 80.0–100.0)
Platelets: 47 10*3/uL — ABNORMAL LOW (ref 150–400)
RBC: 2.81 MIL/uL — ABNORMAL LOW (ref 4.22–5.81)
RDW: 12.8 % (ref 11.5–15.5)
WBC: 5.6 10*3/uL (ref 4.0–10.5)
nRBC: 0 % (ref 0.0–0.2)

## 2022-01-25 LAB — BASIC METABOLIC PANEL
Anion gap: 7 (ref 5–15)
BUN: 15 mg/dL (ref 6–20)
CO2: 35 mmol/L — ABNORMAL HIGH (ref 22–32)
Calcium: 8.6 mg/dL — ABNORMAL LOW (ref 8.9–10.3)
Chloride: 98 mmol/L (ref 98–111)
Creatinine, Ser: 0.47 mg/dL — ABNORMAL LOW (ref 0.61–1.24)
GFR, Estimated: >60 mL/min (ref >60)
Glucose, Bld: 131 mg/dL — ABNORMAL HIGH (ref 70–99)
Potassium: 3.5 mmol/L (ref 3.5–5.1)
Sodium: 140 mmol/L (ref 135–145)

## 2022-01-25 LAB — BLOOD GAS, ARTERIAL
Acid-Base Excess: 11.1 mmol/L — ABNORMAL HIGH (ref 0.0–2.0)
Bicarbonate: 37.4 mmol/L — ABNORMAL HIGH (ref 20.0–28.0)
Drawn by: 22766
FIO2: 100 %
O2 Saturation: 98.1 %
Patient temperature: 37.4
pCO2 arterial: 56 mmHg — ABNORMAL HIGH (ref 32–48)
pH, Arterial: 7.43 (ref 7.35–7.45)
pO2, Arterial: 93 mmHg (ref 83–108)

## 2022-01-25 LAB — CULTURE, RESPIRATORY W GRAM STAIN

## 2022-01-25 MED ORDER — PROPOFOL 10 MG/ML IV BOLUS
INTRAVENOUS | Status: AC
  Administered 2022-01-25: 50 mg via INTRAVENOUS
  Filled 2022-01-25: qty 20

## 2022-01-25 MED ORDER — LEVALBUTEROL HCL 0.63 MG/3ML IN NEBU: 0.6300 mg | INHALATION_SOLUTION | Freq: Four times a day (QID) | RESPIRATORY_TRACT | 12 refills | Status: AC

## 2022-01-25 MED ORDER — FENTANYL CITRATE PF 50 MCG/ML IJ SOSY
50.0000 ug | PREFILLED_SYRINGE | Freq: Once | INTRAMUSCULAR | Status: AC
  Administered 2022-01-25: 50 ug via INTRAVENOUS
  Filled 2022-01-25: qty 1

## 2022-01-25 MED ORDER — FOLIC ACID 1 MG PO TABS
1.0000 mg | ORAL_TABLET | Freq: Every day | ORAL | Status: DC
  Administered 2022-01-25: 1 mg
  Filled 2022-01-25: qty 1

## 2022-01-25 MED ORDER — SUCCINYLCHOLINE CHLORIDE 200 MG/10ML IV SOSY
PREFILLED_SYRINGE | INTRAVENOUS | Status: DC | PRN
  Administered 2022-01-25: 100 mg via INTRAVENOUS

## 2022-01-25 MED ORDER — ROCURONIUM BROMIDE 10 MG/ML (PF) SYRINGE
PREFILLED_SYRINGE | INTRAVENOUS | Status: AC
  Filled 2022-01-25: qty 10

## 2022-01-25 MED ORDER — ONDANSETRON HCL 4 MG/2ML IJ SOLN: 4.0000 mg | Freq: Four times a day (QID) | INTRAMUSCULAR | Status: DC | PRN

## 2022-01-25 MED ORDER — PAROXETINE HCL 10 MG PO TABS: 10.0000 mg | ORAL_TABLET | Freq: Two times a day (BID) | ORAL | Status: AC

## 2022-01-25 MED ORDER — PAROXETINE HCL 10 MG PO TABS
10.0000 mg | ORAL_TABLET | Freq: Two times a day (BID) | ORAL | Status: DC
  Administered 2022-01-25 (×2): 10 mg
  Filled 2022-01-25 (×6): qty 1

## 2022-01-25 MED ORDER — BUDESONIDE 0.5 MG/2ML IN SUSP: 0.5000 mg | Freq: Two times a day (BID) | RESPIRATORY_TRACT | 12 refills | Status: AC

## 2022-01-25 MED ORDER — ACYCLOVIR 800 MG PO TABS: 800.0000 mg | ORAL_TABLET | Freq: Two times a day (BID) | ORAL | Status: AC

## 2022-01-25 MED ORDER — ETOMIDATE 2 MG/ML IV SOLN
INTRAVENOUS | Status: AC
  Filled 2022-01-25: qty 20

## 2022-01-25 MED ORDER — POLYVINYL ALCOHOL 1.4 % OP SOLN: 1.0000 [drp] | OPHTHALMIC | 0 refills | Status: AC | PRN

## 2022-01-25 MED ORDER — HYDROXYZINE HCL 25 MG PO TABS: 25.0000 mg | ORAL_TABLET | Freq: Three times a day (TID) | ORAL | 0 refills | Status: AC | PRN

## 2022-01-25 MED ORDER — POLYETHYLENE GLYCOL 3350 17 G PO PACK
17.0000 g | PACK | Freq: Every day | ORAL | Status: DC
  Administered 2022-01-25: 17 g
  Filled 2022-01-25: qty 1

## 2022-01-25 MED ORDER — FENTANYL CITRATE PF 50 MCG/ML IJ SOSY
PREFILLED_SYRINGE | INTRAMUSCULAR | Status: AC
  Filled 2022-01-25: qty 2

## 2022-01-25 MED ORDER — PROPOFOL 1000 MG/100ML IV EMUL
5.0000 ug/kg/min | INTRAVENOUS | Status: DC
  Administered 2022-01-25: 25 ug/kg/min via INTRAVENOUS
  Administered 2022-01-25: 10 ug/kg/min via INTRAVENOUS
  Administered 2022-01-25: 25 ug/kg/min via INTRAVENOUS
  Filled 2022-01-25 (×2): qty 100

## 2022-01-25 MED ORDER — SODIUM CHLORIDE 0.9 % IV SOLN
100.0000 mg | INTRAVENOUS | Status: DC
  Administered 2022-01-25: 100 mg via INTRAVENOUS
  Filled 2022-01-25: qty 5

## 2022-01-25 MED ORDER — FOLIC ACID 1 MG PO TABS: 1.0000 mg | ORAL_TABLET | Freq: Every day | ORAL | Status: AC

## 2022-01-25 MED ORDER — ORAL CARE MOUTH RINSE: 15.0000 mL | OROMUCOSAL | 0 refills | Status: AC

## 2022-01-25 MED ORDER — PROPOFOL 1000 MG/100ML IV EMUL
INTRAVENOUS | Status: AC
  Administered 2022-01-25: 5 ug/kg/min via INTRAVENOUS
  Filled 2022-01-25: qty 100

## 2022-01-25 MED ORDER — SODIUM CHLORIDE 0.9 % IV SOLN: 2.0000 g | Freq: Three times a day (TID) | INTRAVENOUS | Status: AC

## 2022-01-25 MED ORDER — VITAL HIGH PROTEIN PO LIQD: 1000.0000 mL | ORAL | Status: AC

## 2022-01-25 MED ORDER — MIDAZOLAM HCL 2 MG/2ML IJ SOLN
2.0000 mg | INTRAMUSCULAR | Status: AC | PRN
  Administered 2022-01-25 (×3): 2 mg via INTRAVENOUS
  Filled 2022-01-25 (×3): qty 2

## 2022-01-25 MED ORDER — PROPOFOL 10 MG/ML IV BOLUS: 50.0000 mg | Freq: Once | INTRAVENOUS | Status: AC

## 2022-01-25 MED ORDER — ORAL CARE MOUTH RINSE: 15.0000 mL | OROMUCOSAL | Status: DC | PRN

## 2022-01-25 MED ORDER — PROSOURCE TF20 ENFIT COMPATIBL EN LIQD: 60.0000 mL | Freq: Every day | ENTERAL | Status: AC

## 2022-01-25 MED ORDER — MIDAZOLAM HCL 2 MG/2ML IJ SOLN: 2.0000 mg | INTRAMUSCULAR | 0 refills | Status: AC | PRN

## 2022-01-25 MED ORDER — METHYLPREDNISOLONE SODIUM SUCC 125 MG IJ SOLR: 80.0000 mg | INTRAMUSCULAR | 0 refills | Status: AC

## 2022-01-25 MED ORDER — ORAL CARE MOUTH RINSE
15.0000 mL | OROMUCOSAL | Status: DC
  Administered 2022-01-25 (×5): 15 mL via OROMUCOSAL

## 2022-01-25 MED ORDER — POLYETHYLENE GLYCOL 3350 17 G PO PACK: 17.0000 g | PACK | Freq: Every day | ORAL | 0 refills | Status: AC

## 2022-01-25 MED ORDER — VITAL HIGH PROTEIN PO LIQD
1000.0000 mL | ORAL | Status: DC
  Administered 2022-01-25: 1000 mL

## 2022-01-25 MED ORDER — SUCCINYLCHOLINE CHLORIDE 200 MG/10ML IV SOSY
PREFILLED_SYRINGE | INTRAVENOUS | Status: AC
  Administered 2022-01-25: 100 mg
  Filled 2022-01-25: qty 10

## 2022-01-25 MED ORDER — FENTANYL 2500MCG IN NS 250ML (10MCG/ML) PREMIX INFUSION
0.0000 ug/h | INTRAVENOUS | Status: DC
  Administered 2022-01-25: 50 ug/h via INTRAVENOUS
  Filled 2022-01-25: qty 250

## 2022-01-25 MED ORDER — ROCURONIUM BROMIDE 50 MG/5ML IV SOLN: 80.0000 mg | Freq: Once | INTRAVENOUS | Status: DC

## 2022-01-25 MED ORDER — DOCUSATE SODIUM 50 MG/5ML PO LIQD
100.0000 mg | Freq: Two times a day (BID) | ORAL | Status: DC
  Administered 2022-01-25: 100 mg
  Filled 2022-01-25: qty 10

## 2022-01-25 MED ORDER — MIDODRINE HCL 5 MG PO TABS
10.0000 mg | ORAL_TABLET | Freq: Three times a day (TID) | ORAL | Status: DC
  Administered 2022-01-25: 10 mg
  Filled 2022-01-25: qty 2

## 2022-01-25 MED ORDER — DEXTROMETHORPHAN POLISTIREX ER 30 MG/5ML PO SUER: 30.0000 mg | Freq: Two times a day (BID) | ORAL | Status: DC | PRN

## 2022-01-25 MED ORDER — ORAL CARE MOUTH RINSE: 15.0000 mL | OROMUCOSAL | 0 refills | Status: AC | PRN

## 2022-01-25 MED ORDER — LORATADINE 10 MG PO TABS: 10.0000 mg | ORAL_TABLET | Freq: Every day | ORAL | Status: DC

## 2022-01-25 MED ORDER — DEXTROMETHORPHAN POLISTIREX ER 30 MG/5ML PO SUER: 30.0000 mg | Freq: Two times a day (BID) | ORAL | 0 refills | Status: AC | PRN

## 2022-01-25 MED ORDER — FENTANYL BOLUS VIA INFUSION: 50.0000 ug | INTRAVENOUS | 0 refills | Status: AC | PRN

## 2022-01-25 MED ORDER — PROSOURCE TF20 ENFIT COMPATIBL EN LIQD
60.0000 mL | Freq: Every day | ENTERAL | Status: DC
  Administered 2022-01-25: 60 mL
  Filled 2022-01-25: qty 60

## 2022-01-25 MED ORDER — FENTANYL 2500MCG IN NS 250ML (10MCG/ML) PREMIX INFUSION: 0.0000 ug/h | INTRAVENOUS | 0 refills | Status: AC

## 2022-01-25 MED ORDER — PROPOFOL 10 MG/ML IV BOLUS
INTRAVENOUS | Status: DC | PRN
  Administered 2022-01-25: 80 mg via INTRAVENOUS

## 2022-01-25 MED ORDER — PANTOPRAZOLE SODIUM 40 MG IV SOLR: 40.0000 mg | INTRAVENOUS | Status: AC

## 2022-01-25 MED ORDER — HYDROXYZINE HCL 25 MG PO TABS: 25.0000 mg | ORAL_TABLET | Freq: Three times a day (TID) | ORAL | Status: DC | PRN

## 2022-01-25 MED ORDER — MIDAZOLAM HCL 2 MG/2ML IJ SOLN
2.0000 mg | INTRAMUSCULAR | Status: DC | PRN
  Administered 2022-01-25: 2 mg via INTRAVENOUS
  Filled 2022-01-25: qty 2

## 2022-01-25 MED ORDER — SUCCINYLCHOLINE CHLORIDE 200 MG/10ML IV SOSY: 80.0000 mg | PREFILLED_SYRINGE | Freq: Once | INTRAVENOUS | Status: DC

## 2022-01-25 MED ORDER — LORATADINE 10 MG PO TABS: 10.0000 mg | ORAL_TABLET | Freq: Every day | ORAL | Status: AC

## 2022-01-25 MED ORDER — ACETAMINOPHEN 325 MG PO TABS: 650.0000 mg | ORAL_TABLET | Freq: Four times a day (QID) | ORAL | Status: AC | PRN

## 2022-01-25 MED ORDER — HYDROCODONE-ACETAMINOPHEN 5-325 MG PO TABS: 1.0000 | ORAL_TABLET | Freq: Four times a day (QID) | ORAL | Status: DC | PRN

## 2022-01-25 MED ORDER — ONDANSETRON HCL 4 MG PO TABS: 4.0000 mg | ORAL_TABLET | Freq: Four times a day (QID) | ORAL | Status: DC | PRN

## 2022-01-25 MED ORDER — LIDOCAINE HCL (CARDIAC) PF 100 MG/5ML IV SOSY
PREFILLED_SYRINGE | INTRAVENOUS | Status: AC
  Filled 2022-01-25: qty 5

## 2022-01-25 MED ORDER — MIDODRINE HCL 10 MG PO TABS: 10.0000 mg | ORAL_TABLET | Freq: Three times a day (TID) | ORAL | Status: AC

## 2022-01-25 MED ORDER — DOCUSATE SODIUM 50 MG/5ML PO LIQD: 100.0000 mg | Freq: Two times a day (BID) | ORAL | 0 refills | Status: AC

## 2022-01-25 MED ORDER — SODIUM CHLORIDE 0.9 % IV SOLN: INTRAVENOUS | Status: AC

## 2022-01-25 MED ORDER — METHYLPREDNISOLONE SODIUM SUCC 125 MG IJ SOLR: 80.0000 mg | INTRAMUSCULAR | Status: DC

## 2022-01-25 MED ORDER — CHLORHEXIDINE GLUCONATE CLOTH 2 % EX PADS: 6.0000 | MEDICATED_PAD | Freq: Every day | CUTANEOUS | Status: AC

## 2022-01-25 MED ORDER — FENTANYL CITRATE (PF) 100 MCG/2ML IJ SOLN
INTRAMUSCULAR | Status: AC
  Filled 2022-01-25: qty 2

## 2022-01-25 MED ORDER — ONDANSETRON HCL 4 MG PO TABS: 4.0000 mg | ORAL_TABLET | Freq: Four times a day (QID) | ORAL | 0 refills | Status: AC | PRN

## 2022-01-25 MED ORDER — PROPOFOL 1000 MG/100ML IV EMUL: 5.0000 ug/kg/min | INTRAVENOUS | Status: AC

## 2022-01-25 MED ORDER — MIDAZOLAM HCL 2 MG/2ML IJ SOLN
INTRAMUSCULAR | Status: AC
  Filled 2022-01-25: qty 2

## 2022-01-25 MED ORDER — FENTANYL BOLUS VIA INFUSION
50.0000 ug | INTRAVENOUS | Status: DC | PRN
  Administered 2022-01-25: 50 ug via INTRAVENOUS
  Administered 2022-01-25: 75 ug via INTRAVENOUS

## 2022-01-25 MED ORDER — HYDROCODONE-ACETAMINOPHEN 5-325 MG PO TABS: 1.0000 | ORAL_TABLET | Freq: Four times a day (QID) | ORAL | 0 refills | Status: AC | PRN

## 2022-01-25 NOTE — Anesthesia Procedure Notes (Signed)
Procedure Name: Intubation Date/Time: 01/25/2022 11:23 AM  Performed by: Minerva Ends, CRNAPre-anesthesia Checklist: Patient identified, Emergency Drugs available, Suction available and Patient being monitored Patient Re-evaluated:Patient Re-evaluated prior to induction Oxygen Delivery Method: Circle system utilized Preoxygenation: Pre-oxygenation with 100% oxygen Induction Type: IV induction Ventilation: Mask ventilation without difficulty Laryngoscope Size: Mac, 3 and Glidescope Grade View: Grade II Tube type: Oral Tube size: 8.0 mm Number of attempts: 1 Airway Equipment and Method: Stylet and Oral airway Placement Confirmation: ETT inserted through vocal cords under direct vision, positive ETCO2 and breath sounds checked- equal and bilateral Secured at: 23 cm Tube secured with: Tape Dental Injury: Teeth and Oropharynx as per pre-operative assessment

## 2022-01-25 NOTE — Progress Notes (Signed)
Spoke to receiving call center from [facility] transportation), phone number is [phone]. Patient will be transported by ground transport per verbal from Dr Elsworth Soho. [employer] aware and states pick up will be sometime on night shift which Dr Elsworth Soho is aware of and ok with.

## 2022-01-25 NOTE — Progress Notes (Signed)
RN informed this RT that patient hanging around in 13s since putting on [place] off Bipap.  Patient has been hanging in the 80s off and on even on Bipap during night.  Patient desats quickly when off O2 support and hard to recover.  Asked RN to try NRB with [facility] to see if sats improve.  RN informed this RT that she put informed MD and put in for CXR to see if there are any changes.  Will continue to monitor.

## 2022-01-25 NOTE — Progress Notes (Signed)
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  He has had some posttussive emesis.  He has had subjective fevers and chills.  He has a headache but denies any visual disturbance.  He denies any hemoptysis, hematemesis, abdominal pain, dysuria, hematuria, hematochezia, melena. He went to his PCP on the morning of 01/18/2022.  He was noted to be hypoxic in the low 80s.  He was sent to the emergency department for further evaluation. In the ED, the patient had low-grade temperature of 100.2 F.  He was tachycardic into the 120s.  He was hemodynamically stable with oxygen saturation 84% on room air.  He was placed on 3 L.  WBC 4.6, hemoglobin 12.7, platelets 64,000.  Sodium 142, potassium 4.1, bicarbonate 31, serum creatinine 0.68.  AST 23, ALT 29, alk phos 378, total bilirubin 0.4.  The patient was initially started on ceftriaxone and azithromycin.    Assessment and Plan: * Acute respiratory failure with hypoxia (HCC) -Presented with tachypnea and hypoxia 84% on room air at time of admission. -Patient in the requirement of BiPAP overnight/intermittently for 48 hours and despite initial improvement has now continue desaturating and worsening. Further work up has now demonstrated development of ARDS. -case discussed with PCCM, recommendations given for intubation and mechanical ventilation. -patient will continue current antibiotics, plan is for deep sedation, pronning, ARDS protocol and  Bronchoscopy evaluation. -anticipating slow recovery and with recommendations for transfer to higher level of care [facility]) -continue supportive care and follow rec's by PCCM -Continue following closely I's and O's and daily weights. -continue PRN use  of diuretics.  Lobar pneumonia ([place]) -Given the patient's immunocompromise state and frequent hospitalizations--continue broad IV antibiotics -Follow culture results -MRSA screen negative.  Severe sepsis ([place]) -Presented with tachypnea, tachycardia, and respiratory failure with hypoxia -Secondary to pneumonia -Lactic acid peaked 1.4 PCT 0.13 -Continue to maintain adequate hydration; now orally at that is presence of vascular congestion and worsening respiratory distress and what appears to be interstitial edema. -Chest x-ray demonstrated slight improvement in aeration initially; with now further decompensation on repeat images suggesting ARDS process.  -patient has required to be intubated. -will continue current IV antibiotics and supportive care.    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[place]) -Patient has schedule outpatient follow-up with hematology/oncology at [facility] on (02/01/2022) -will try to transfer him there. 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 -At this moment will hold Lovenox -SCDs for DVT prophylaxis.   08/25/2021 Biopsy  Variably hypercellular marrow (30-70%) with erythroid hyperplasia, granulocytic hypoplasia, T-cell large granular lymphocytosis, and no increase in blasts. Flow shows no distinct blas population and T-cell large granular lymphocytosis (51% of T cells). Chimerism 99.8% donor.  07/17/2020 - 07/24/2020 Chemotherapy  INPT ONC BMT Adult Allogeneic Transplant Fludarabine / Busulfan (Myeloablative   Family Communication:   Daughter and wife updated at bedside.  Consultants: Pulmonologist  Code Status:  FULL   DVT Prophylaxis:  SCDs   Procedures: As Listed in Progress Note  Above  Antibiotics: Cefepime 9/12>> Azithro 9/11>>   Subjective: Spite  the use of BiPAP overnight patient continued to experience increased respiratory distress with hypoxia and repeat chest x-ray suggesting ARDS.  Case has been discussed with pulmonology service with recommendations for intubation and ventilatory support.   Objective: Vitals:   01/25/22 1123 01/25/22 1124 01/25/22 1125 01/25/22 1133  BP:  (!) 107/57    Pulse: 93 97 (!) 103   Resp: (!) 24 (!) 22 (!) 24   Temp:      TempSrc:      SpO2: 100% 100% 100% 100%  Weight:      Height:        Intake/Output Summary (Last 24 hours) at 01/25/2022 1205 Last data filed at 01/25/2022 1159 Gross per 24 hour  Intake 646.1 ml  Output 1550 ml  Net -903.9 ml   Weight change: 2.1 kg  Exam: General exam: Alert, awake, oriented x 3; increased work of breathing and tachypnea appreciated.  Throughout the night ended requiring BiPAP and despite that was hypoxic (saturation in the low to mid 80s with FiO2 of 100%). Respiratory system: Positive rhonchi bilaterally; decreased breath sounds at the bases.  Positive tachypnea appreciated.  Mild expiratory wheezing. Cardiovascular system:RRR. No murmurs, rubs, gallops.  No JVD. Gastrointestinal system: Abdomen is nondistended, soft and nontender. No organomegaly or masses felt. Normal bowel sounds heard. Central nervous system: Alert and oriented. No focal neurological deficits.  No focal weaknesses.  Patient is following commands appropriately. Extremities: No cyanosis or clubbing. Skin: No petechiae. Psychiatry: Judgement and insight appear normal.  Flat affect.  Data Reviewed: I have personally reviewed following labs and imaging studies  Basic Metabolic Panel: Recent Labs  Lab 01/18/22 1955 01/19/22 0446 01/20/22 3953 01/21/22 0510 01/22/22 0525 01/23/22 0400 01/25/22 0452  NA  --    < > 136 135 138 139 140  K  --    < > 3.9 4.3 3.7 3.4* 3.5  CL  --    < > 102 100 98 100 98   CO2  --    < > 30 33* 35* 32 35*  GLUCOSE  --    < > 129* 136* 144* 146* 131*  BUN  --    < > 8 10 12 17 15   CREATININE 0.79   < > 0.60* 0.55* 0.56* 0.54* 0.47*  CALCIUM  --    < > 8.1* 8.4* 9.0 8.7* 8.6*  MG 2.0  --  1.8  --   --   --   --    < > = values in this interval not displayed.   Liver Function Tests: Recent Labs  Lab 01/18/22 1405 01/19/22 0446 01/21/22 0510 01/22/22 0525  AST 29 23 28 31   ALT 33 29 29 31   ALKPHOS 86 78 70 67  BILITOT 0.7 0.4 0.8 1.0  PROT 5.7* 5.1* 4.7* 5.1*  ALBUMIN 2.9* 2.5* 2.2* 2.4*   Coagulation Profile: Recent Labs  Lab 01/19/22 0446  INR 1.1   CBC: Recent Labs  Lab 01/18/22 1405 01/18/22 1955 01/19/22 0446 01/20/22 0613 01/21/22 0510 01/25/22 0452  WBC 6.6 4.2 4.6 3.3* 5.7 5.6  NEUTROABS 3.1  --   --  1.9  --   --   HGB 13.4 11.6* 12.7* 15.8 11.1* 10.8*  HCT 39.3 34.4* 38.2* 47.4 33.8* 32.8*  MCV 115.2* 116.6* 117.2* 115.6* 117.8* 116.7*  PLT 74* 62* 64* 35* 49* 47*   Urine analysis:    Component Value Date/Time   COLORURINE YELLOW 01/19/2022 0630   APPEARANCEUR CLEAR 01/19/2022 0630  LABSPEC 1.014 01/19/2022 0630   PHURINE 6.0 01/19/2022 0630   GLUCOSEU NEGATIVE 01/19/2022 0630   HGBUR NEGATIVE 01/19/2022 0630   BILIRUBINUR NEGATIVE 01/19/2022 0630   KETONESUR NEGATIVE 01/19/2022 0630   PROTEINUR NEGATIVE 01/19/2022 0630   NITRITE NEGATIVE 01/19/2022 0630   LEUKOCYTESUR NEGATIVE 01/19/2022 0630   Sepsis Labs:  Recent Results (from the past 240 hour(s))  SARS Coronavirus 2 by RT PCR (hospital order, performed in [facility] hospital lab) *cepheid single result test* Anterior Nasal Swab     Status: None   Collection Time: 01/18/22  1:53 PM   Specimen: Anterior Nasal Swab  Result Value Ref Range Status   SARS Coronavirus 2 by RT PCR NEGATIVE NEGATIVE Final    Comment: (NOTE) SARS-CoV-2 target nucleic acids are NOT DETECTED.  The SARS-CoV-2 RNA is generally detectable in upper and lower respiratory specimens  during the acute phase of infection. The lowest concentration of SARS-CoV-2 viral copies this assay can detect is 250 copies / mL. A negative result does not preclude SARS-CoV-2 infection and should not be used as the sole basis for treatment or other patient management decisions.  A negative result may occur with improper specimen collection / handling, submission of specimen other than nasopharyngeal swab, presence of viral mutation(s) within the areas targeted by this assay, and inadequate number of viral copies (<250 copies / mL). A negative result must be combined with clinical observations, patient history, and epidemiological information.  Fact Sheet for Patients:   https://www.patel.info/  Fact Sheet for Healthcare Providers: https://hall.com/  This test is not yet approved or  cleared by the [place] FDA and has been authorized for detection and/or diagnosis of SARS-CoV-2 by FDA under an Emergency Use Authorization (EUA).  This EUA will remain in effect (meaning this test can be used) for the duration of the COVID-19 declaration under Section 564(b)(1) of the Act, 21 U.S.C. section 360bbb-3(b)(1), unless the authorization is terminated or revoked sooner.  Performed at [facility], [address] [place], [place] [address]   Culture, blood (single)     Status: None   Collection Time: 01/18/22  3:04 PM   Specimen: Right Antecubital; Blood  Result Value Ref Range Status   Specimen Description RIGHT ANTECUBITAL  Final   Special Requests   Final    BOTTLES DRAWN AEROBIC ONLY Blood Culture results may not be optimal due to an excessive volume of blood received in culture bottles   Culture   Final    NO GROWTH 5 DAYS Performed at [facility], [address], [place], [place] [address]    Report Status 01/23/2022 FINAL  Final  MRSA Next Gen by PCR, Nasal     Status: None   Collection Time: 01/19/22  9:36 AM   Specimen: Nasal  Mucosa; Nasal Swab  Result Value Ref Range Status   MRSA by PCR Next Gen NOT DETECTED NOT DETECTED Final    Comment: (NOTE) The GeneXpert MRSA Assay (FDA approved for NASAL specimens only), is one component of a comprehensive MRSA colonization surveillance program. It is not intended to diagnose MRSA infection nor to guide or monitor treatment for MRSA infections. Test performance is not FDA approved in patients less than 89 years old. Performed at [facility], [address], [place],  [address]   MRSA Next Gen by PCR, Nasal     Status: None   Collection Time: 01/20/22 11:36 PM   Specimen: Nasal Mucosa; Nasal Swab  Result Value Ref Range Status  MRSA by PCR Next Gen NOT DETECTED NOT DETECTED Final    Comment: (NOTE) The GeneXpert MRSA Assay (FDA approved for NASAL specimens only), is one component of a comprehensive MRSA colonization surveillance program. It is not intended to diagnose MRSA infection nor to guide or monitor treatment for MRSA infections. Test performance is not FDA approved in patients less than 60 years old. Performed at [facility], [address] [place], [place] [address]   Expectorated Sputum Assessment w Gram Stain, Rflx to Resp Cult     Status: None   Collection Time: 01/21/22  3:58 AM   Specimen: Sputum  Result Value Ref Range Status   Specimen Description SPUTUM  Final   Special Requests NONE  Final   Sputum evaluation   Final    THIS SPECIMEN IS ACCEPTABLE FOR SPUTUM CULTURE Performed at [facility], [address], [place], [place] [address]    Report Status 01/22/2022 FINAL  Final  Culture, Respiratory w Gram Stain     Status: None (Preliminary result)   Collection Time: 01/21/22  3:58 AM   Specimen: SPU  Result Value Ref Range Status   Specimen Description   Final    SPUTUM Performed at [facility], [address], [place], [place] [address]    Special Requests   Final    NONE Reflexed from [id number] Performed at [facility],  [address], [place], [place] [address]    Gram Stain   Final    FEW SQUAMOUS EPITHELIAL CELLS PRESENT FEW WBC PRESENT,BOTH PMN AND MONONUCLEAR MODERATE YEAST WITH PSEUDOHYPHAE FEW GRAM NEGATIVE RODS FEW GRAM POSITIVE RODS    Culture   Final    MODERATE CANDIDA ALBICANS NO STAPHYLOCOCCUS AUREUS ISOLATED No Pseudomonas species isolated Performed at [facility], [place] [address], [place], [place] [address]    Report Status PENDING  Incomplete    Scheduled Meds:  acyclovir  800 mg Oral BID   budesonide (PULMICORT) nebulizer solution  0.5 mg Nebulization BID   Chlorhexidine Gluconate Cloth  6 each Topical Daily   docusate  100 mg Per Tube BID   folic acid  1 mg Oral QHS   levalbuterol  0.63 mg Nebulization Q6H   loratadine  10 mg Oral Daily   melatonin  6 mg Oral Daily   methylPREDNISolone (SOLU-MEDROL) injection  40 mg Intravenous Q24H   midodrine  10 mg Oral TID WC    morphine injection  4 mg Intravenous Once   pantoprazole (PROTONIX) IV  40 mg Intravenous Q24H   PARoxetine  25 mg Oral Daily   polyethylene glycol  17 g Per Tube Daily   succinylcholine  80 mg Intravenous Once   tiZANidine  4 mg Oral QHS   ursodiol  600 mg Oral BID   Continuous Infusions:  ceFEPime (MAXIPIME) IV Stopped (01/25/22 1105)   fentaNYL infusion INTRAVENOUS 50 mcg/hr (01/25/22 1159)   propofol (DIPRIVAN) infusion Stopped (01/25/22 1146)   Procedures/Studies: DG Chest Portable 1 View  Result Date: 01/25/2022 CLINICAL DATA:  Endotracheal tube placement EXAM: PORTABLE CHEST 1 VIEW COMPARISON:  Radiograph 01/25/2022 at 612 hours FINDINGS: Endotracheal tube position 1.8 cm from carina. Placement NG tube with tube in stomach. Tip of the tube is below the margin the film. Port in the anterior chest wall with tip in distal SVC. Bilateral airspace disease unchanged from prior. IMPRESSION: NG tube and endotracheal tube appear in good position. Electronically Signed   By: Suzy Bouchard M.D.   On: 01/25/2022  11:56   DG CHEST PORT 1 VIEW  Result Date: 01/25/2022 CLINICAL DATA:  61 year old male with history of increasing shortness of breath. EXAM: PORTABLE CHEST 1 VIEW COMPARISON:  Chest x-ray 01/23/2022. FINDINGS: Right internal jugular single-lumen power porta cath with tip terminating in the distal superior vena cava. There is cephalization of the pulmonary vasculature, indistinctness of the interstitial markings, and patchy airspace disease throughout the lungs bilaterally suggestive of moderate pulmonary edema. Small bilateral pleural effusions. Mild cardiomegaly. Upper mediastinal contours are distorted by patient positioning. IMPRESSION: 1. The appearance the chest is most suggestive of congestive heart failure, although severe multilobar bilateral pneumonia could have a similar appearance. Electronically Signed   By: Vinnie Langton M.D.   On: 01/25/2022 06:20   DG CHEST PORT 1 VIEW  Result Date: 01/23/2022 CLINICAL DATA:  History of myelodysplastic syndrome. Status post bone marrow transplant. Worsening cough and shortness of breath. EXAM: PORTABLE CHEST 1 VIEW COMPARISON:  01/21/2022 FINDINGS: Right chest wall port a catheter noted with tip at the cavoatrial junction. Stable cardiomediastinal contours. Unchanged small left pleural effusion. Bilateral upper lung zone predominant interstitial and airspace opacities are again noted. Compared with the previous exam the aeration to the upper lung zones appears slightly improved in the interval. IMPRESSION: Slight improvement in aeration to the upper lung zones compared with previous exam. Electronically Signed   By: Kerby Moors M.D.   On: 01/23/2022 09:16   DG Chest Port 1 View  Result Date: 01/21/2022 CLINICAL DATA:  Hypoxia EXAM: PORTABLE CHEST 1 VIEW COMPARISON:  01/19/2022 CTA chest FINDINGS: Severe biapical consolidation. Right chest wall Port-A-Cath tip in the lower SVC. Mild cardiomegaly. IMPRESSION: Severe biapical consolidation.  Electronically Signed   By: Ulyses Jarred M.D.   On: 01/21/2022 03:40   CT Angio Chest Pulmonary Embolism (PE) W or WO Contrast  Result Date: 01/19/2022 CLINICAL DATA:  Chest pain with shortness of breath. EXAM: CT ANGIOGRAPHY CHEST WITH CONTRAST TECHNIQUE: Multidetector CT imaging of the chest was performed using the standard protocol during bolus administration of intravenous contrast. Multiplanar CT image reconstructions and MIPs were obtained to evaluate the vascular anatomy. RADIATION DOSE REDUCTION: This exam was performed according to the departmental dose-optimization program which includes automated exposure control, adjustment of the mA and/or kV according to patient size and/or use of iterative reconstruction technique. CONTRAST:  76m OMNIPAQUE IOHEXOL 350 MG/ML SOLN COMPARISON:  None Available. FINDINGS: Cardiovascular: Satisfactory opacification of the pulmonary arteries to the segmental level. No evidence of pulmonary embolism. Normal heart size. There is a small pericardial effusion. Mediastinum/Nodes: There are enlarged right hilar lymph nodes measuring up to 16 mm. There is an enlarged subcarinal lymph node measuring 11 mm. Visualized esophagus and thyroid gland are within normal limits. Lungs/Pleura: There is bilateral patchy airspace consolidation throughout both lungs most prominent in the bilateral upper lobes, right greater than left. There are small bilateral pleural effusions. There is some sparing of the lung bases. Trachea and central airways are patent. There is no evidence for pneumothorax. Upper Abdomen: No acute abnormality. Musculoskeletal: There are mild chronic compression deformities of T8 and T9. No acute fractures are seen. Review of the MIP images confirms the above findings. IMPRESSION: 1. No evidence for pulmonary embolism. 2. Small pericardial effusion. 3. Bilateral patchy airspace consolidation most significant in the bilateral upper lobes. Findings may be related to  pulmonary edema, hemorrhage, and/or infection. 4. Small pleural effusions. 5. Subcarinal and right hilar lymphadenopathy. Electronically Signed   By: ATina GriffithsD.  On: 01/19/2022 18:00   DG Chest 2 View  Result Date: 01/18/2022 CLINICAL DATA:  CMML, MDS, shortness of breath with cough and wheezing EXAM: CHEST - 2 VIEW COMPARISON:  02/16/2020 FINDINGS: Right subclavian power port catheter tip lower SVC level as before. Lower lung volumes with new bilateral mixed interstitial patchy airspace opacities worse in the upper lobes concerning for pneumonia, including atypical pneumonia. No large effusion or pneumothorax. Trachea midline. Stable mild cardiac enlargement. Degenerative changes noted spine. IMPRESSION: Bilateral mixed interstitial and airspace opacities worse in the upper lobes compatible with pneumonia. See above comment. Electronically Signed   By: Jerilynn Mages.  Shick M.D.   On: 01/18/2022 13:37    Barton Dubois, MD  [facility]  If 7PM-7AM, please contact night-coverage [email or web address] Password [email or web address] 01/25/2022, 12:05 PM   LOS: 7 days

## 2022-01-25 NOTE — Consult Note (Signed)
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  Sick contacts with 2 grandkids noted CT angiogram chest 9/12 showed small pericardial effusion, bilateral patchy airspace consolidation mostly in bilateral upper lobes with sparing of the lung bases Initial lactate was 1.4, he was treated with ceftriaxone/azithromycin, COVID testing was negative, blood and sputum cultures were negative .  Urine strep antigen and Legionella antigen was negative 9/13 overnight was febrile and required increased oxygen to 15 L and placed on BiPAP with worsening interstitial infiltrates He has required heated high flow nasal cannula since 9/15 CCM consulted 9/18 for hypoxia in spite of heated high flow  Pertinent  Medical History  CMML Myelodysplastic syndrome Status post bone marrow transplant at [facility] 07/2020  Significant Hospital Events: Including procedures, antibiotic start and stop dates in addition to other pertinent events     Interim History / Subjective:  Critically ill, saturation 85% on heated high flow nasal cannula at 100% / 35 L  Objective   Blood pressure (!) 112/56, pulse 84, temperature 98.3 F (36.8 C), temperature source Axillary, resp. rate (!) 29, height _0  (1.803 m), weight 84.6 kg, SpO2 100 %.    Vent Mode: PRVC FiO2 (%):  [80 %-100 %] 100 % Set Rate:  [30 bmp] 30 bmp Vt Set:  [450 mL] 450 mL PEEP:  [10 cmH20] 10 cmH20 Plateau Pressure:  [32 cmH20] 32 cmH20   Intake/Output Summary (Last 24 hours) at 01/25/2022 1307 Last data filed at 01/25/2022 1235 Gross per 24 hour  Intake 661.32 ml  Output  1550 ml  Net -888.68 ml   Filed Weights   01/19/22 0412 01/24/22 0654 01/25/22 0500  Weight: 83.2 kg 82.5 kg 84.6 kg    Examination: General: Chronically ill-appearing, surprisingly no distress on heated high flow nasal cannula HENT: Mild pallor, no icterus, no JVD, no lymphadenopathy Lungs: Clear breath sounds bilateral, no accessory muscle use Cardiovascular: S1-S2 regular, no murmur Abdomen: Soft, nontender, no hepatosplenomegaly Extremities: No edema, no deformity Neuro: Alert and oriented x3, nonfocal   Labs show mild hypokalemia, no leukocytosis, stable anemia, and thrombocytopenia  Resolved Hospital Problem list     Assessment & Plan:  Bilateral infiltrates with ARDS range hypoxia in this 61 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  Intubation was performed by anesthesia .  Using propofol and fentanyl drip for deep sedation goal RASS -3 to -4 with Versed intermittently for breakthrough. 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 -Would recommend proning , able to achieve vent synchrony with deep sedation so paralytic may not be necessary -BAL performed at bedside and specimen sent for culture, respiratory viral panel, AFB, fungal and cell count and cytology , no evidence of alveolar hemorrhage -Continue cefepime -Add micafungin while awaiting BAL data -Increase empiric Solu-Medrol 40 every 12   Thrombocytopenia -follow    Best Practice (right click and "Reselect all SmartList Selections" daily)   Diet/type: tubefeeds DVT prophylaxis: SCD GI prophylaxis: PPI Lines: N/A Foley:  Yes, and it is still needed Code Status:  full code Last date of multidisciplinary goals of care discussion [updated wife and children at  bedside in great detail, they request transfer to [facility] where his transplant doctors are and [facility] will try to arrange]  Labs   CBC: Recent Labs  Lab 01/18/22 1405 01/18/22 1955 01/19/22 0446 01/20/22 0613 01/21/22 0510 01/25/22 0452  WBC 6.6 4.2 4.6 3.3* 5.7 5.6  NEUTROABS 3.1  --   --  1.9  --   --   HGB 13.4 11.6* 12.7* 15.8 11.1* 10.8*  HCT 39.3 34.4* 38.2* 47.4 33.8* 32.8*  MCV 115.2* 116.6* 117.2* 115.6* 117.8* 116.7*  PLT 74* 62* 64* 35* 49* 47*    Basic Metabolic Panel: Recent Labs  Lab 01/18/22 1955 01/19/22 0446 01/20/22 0613 01/21/22 0510 01/22/22 0525 01/23/22 0400 01/25/22 0452  NA  --    < > 136 135 138 139 140  K  --    < > 3.9 4.3 3.7 3.4* 3.5  CL  --    < > 102 100 98 100 98  CO2  --    < > 30 33* 35* 32 35*  GLUCOSE  --    < > 129* 136* 144* 146* 131*  BUN  --    < > _0 CREATININE 0.79   < > 0.60* 0.55* 0.56* 0.54* 0.47*  CALCIUM  --    < > 8.1* 8.4* 9.0 8.7* 8.6*  MG 2.0  --  1.8  --   --   --   --    < > = values in this interval not displayed.   GFR: Estimated Creatinine Clearance: 105.9 mL/min (A) (by C-G formula based on SCr of 0.47 mg/dL (L)). Recent Labs  Lab 01/18/22 1407 01/18/22 1955 01/19/22 0446 01/20/22 0613 01/21/22 0510 01/25/22 0452  PROCALCITON  --   --  0.13  --   --   --   WBC  --    < > 4.6 3.3* 5.7 5.6  LATICACIDVEN 1.4  --   --   --   --   --    < > = values in this interval not displayed.    Liver Function Tests: Recent Labs  Lab 01/18/22 1405 01/19/22 0446 01/21/22 0510 01/22/22 0525  AST _1 ALT 33 _2 ALKPHOS 86 78 70 67  BILITOT 0.7 0.4 0.8 1.0  PROT 5.7* 5.1* 4.7* 5.1*  ALBUMIN 2.9* 2.5* 2.2* 2.4*   No results for input(s): "LIPASE", "AMYLASE" in the last 168 hours. No results for input(s): "AMMONIA" in the last 168 hours.  ABG    Component Value Date/Time   PHART 7.43 01/25/2022 1215   PCO2ART 56 (H) 01/25/2022 1215   PO2ART 93 01/25/2022 1215   HCO3 37.4 (H)  01/25/2022 1215   O2SAT 98.1 01/25/2022 1215     Coagulation Profile: Recent Labs  Lab 01/19/22 0446  INR 1.1    Cardiac Enzymes: No results for input(s): "CKTOTAL", "CKMB", "CKMBINDEX", "TROPONINI" in the last 168 hours.  HbA1C: No results found for: "HGBA1C"  CBG: Recent Labs  Lab 01/25/22 1211  GLUCAP 155*    Review of Systems:   Shortness of breath, hypoxia Dry cough   Past Medical History:  He,  has a past medical history of Cancer ([place]) and Port-A-Cath in place (12/28/2019).   Surgical History:   Past Surgical History:  Procedure Laterality Date  COLONOSCOPY     PORTACATH PLACEMENT Right 01/18/2020   Procedure: INSERTION PORT-A-CATH;  Surgeon: Aviva Signs, MD;  Location: [facility];  Service: General;  Laterality: Right;     Social History:   reports that he quit smoking about 33 years ago. His smoking use included cigarettes. He has never used smokeless tobacco. He reports current alcohol use of about 1.0 - 2.0 standard drink of alcohol per week. He reports that he does not use drugs.   Family History:  His family history includes Bladder Cancer in his father; Scoliosis in his sister.   Allergies No Known Allergies   Home Medications  Prior to Admission medications   Medication Sig Start Date End Date Taking? Authorizing Provider  acyclovir (ZOVIRAX) 800 MG tablet Take 800 mg by mouth 2 (two) times daily. 08/03/21  Yes [provider]  b complex vitamins capsule Take 1 capsule by mouth daily.   Yes [provider]  budesonide (ENTOCORT EC) 3 MG 24 hr capsule Take 9 mg by mouth daily. 09/17/21  Yes [provider]  Calcium Carb-Cholecalciferol (CALCIUM/VITAMIN D PO) Take by mouth.   Yes [provider]  folic acid (FOLVITE) 1 MG tablet Take 1 mg by mouth at bedtime. 08/17/21  Yes [provider]  MAGNESIUM GLYCINATE PO Take 1 tablet by mouth in the morning and at bedtime.   Yes [provider]  Melatonin  5 MG CAPS Take 5 mg by mouth daily.   Yes [provider]  Multiple Vitamin (MULTIVITAMIN) tablet Take 1 tablet by mouth daily.   Yes [provider]  ondansetron (ZOFRAN) 8 MG tablet Take 8 mg by mouth every 8 (eight) hours as needed for nausea or vomiting.   Yes [provider]  ondansetron (ZOFRAN-ODT) 8 MG disintegrating tablet Take 8 mg by mouth every 8 (eight) hours as needed for nausea or vomiting.   Yes [provider]  pantoprazole (PROTONIX) 40 MG tablet Take 1 tablet by mouth 2 (two) times daily. 08/10/21  Yes [provider]  PARoxetine (PAXIL-CR) 25 MG 24 hr tablet Take 1 tablet by mouth daily. 08/18/21  Yes [provider]  penicillin v potassium (VEETID) 500 MG tablet Take 1 tablet by mouth 2 (two) times daily. 11/30/21  Yes [provider]  prochlorperazine (COMPAZINE) 10 MG tablet Take 1 tablet (10 mg total) by mouth every 6 (six) hours as needed (Nausea or vomiting). 12/31/19  Yes Derek Jack, MD  tiZANidine (ZANAFLEX) 4 MG tablet Take 4 mg by mouth at bedtime. 11/24/21  Yes [provider]  ursodiol (ACTIGALL) 300 MG capsule Take 600 mg by mouth 2 (two) times daily. 01/14/22  Yes [provider]  albuterol (VENTOLIN HFA) 108 (90 Base) MCG/ACT inhaler Inhale 1 puff into the lungs every 6 (six) hours as needed for wheezing or shortness of breath.    [provider]  amoxicillin (AMOXIL) 500 MG capsule Take 1 capsule (500 mg total) by mouth 3 (three) times daily. 05/12/20   Derek Jack, MD  azaCITIDine 5 mg/2 mLs in lactated ringers infusion Inject into the vein daily. Days 1-7 every 28 days Patient not taking: Reported on 01/18/2022 12/31/19   [provider]  docusate sodium (COLACE) 100 MG capsule Take 100 mg by mouth 2 (two) times daily.    [provider]  fluconazole (DIFLUCAN) 200 MG tablet Take by mouth. 09/01/21   [provider]  lidocaine-prilocaine  (EMLA) cream Apply a small amount to port a  cath site and cover with plastic wrap 1 hour prior to chemotherapy appointments 12/28/19   Derek Jack, MD     Critical care time: 26 m     Kara Mead MD. [facility]. [place] [facility] Pager : [phone]  If no response to pager , please call [phone] until 7 pm After 7:00 pm call Elink  [phone]   01/25/2022

## 2022-01-25 NOTE — Progress Notes (Signed)
Patient currently intubated, sedation meds given per orders. Prn versed and fentanyl given and Dr Elsworth Soho made aware of PRN's given and patient agitation/restlessness post intubation. Fentanyl and propofol increased per bedside verbal from Dr Elsworth Soho to which Dr Elsworth Soho was present in the room and witness agitation/restlessness. Will continue to monitor. Family at bedside and education provided and all questions answered. Emotional support provided.

## 2022-01-25 NOTE — Progress Notes (Addendum)
Gave patient Chiropodist.  RN and RT noticed patient taking shallow breaths.  This RT gave patient IS and explained usage to patient.  Patient was complaining that when he took deep breaths, it hurt and made him cough.  RT and RN explained that he needed to cough to get secretions out.  Patient was able to almost get 720m X10, coughing in between.  RN gave patient medication.  IS left at bedside.  Made sure flutter was put back at bedside and patient re-educated on how to use.

## 2022-01-25 NOTE — Procedures (Addendum)
Bronchoscopy Procedure Note  Hunter Smith  [id number]  [birth year]  Date:01/25/22  Time:1:25 PM   Provider Performing:Masiyah Jorstad V. Nelissa Bolduc   Procedure(s):  Flexible bronchoscopy with bronchial alveolar lavage (40370)  Indication(s) PNeumonia/ARDS , immunocompromised patient  Consent Risks of the procedure as well as the alternatives and risks of each were explained to the patient and/or caregiver.  Consent for the procedure was obtained and is signed in the bedside chart  Anesthesia Propofol/ fent gtt   Time Out Verified patient identification, verified procedure, site/side was marked, verified correct patient position, special equipment/implants available, medications/allergies/relevant history reviewed, required imaging and test results available.   Sterile Technique Usual hand hygiene, masks, gowns, and gloves were used   Procedure Description Bronchoscope advanced through endotracheal tube and into airway.  Airways were examined down to subsegmental level with findings noted below.   Following diagnostic evaluation, BAL(s) performed in 30-40  with normal saline and return of 15 fluid from RT LL  Findings: Clear airways, minimal secretions   Complications/Tolerance None; patient tolerated the procedure well. Chest X-ray is not needed post procedure.   EBL Minimal   Specimen(s) BAL for cell count, culture, afb, fungal & cytology  [name] V. AlvaM D

## 2022-01-25 NOTE — Progress Notes (Signed)
Patient placed on Bipap for the night.  Patient requested to be taken off at 5 am.

## 2022-01-25 NOTE — Progress Notes (Signed)
Report called to [facility] in [place], Transport here for pt. Pt transferred with family present fent/prop infusing VSS.

## 2022-01-25 NOTE — Progress Notes (Signed)
Nurse to Nurse report called and given to [place] at [facility]The unit [facility], room 802. Patient to be transferred via air-care who are aware. Dr Dyann Kief and Dr Elsworth Soho aware. Gave report to Roselyn Reef, patient night RN to call [facility] unit at [phone] to update night nurse and let them know patient is on the way. Family at bedside and also updated. Transport paperwork and discharge summary placed in vanilla envelope for transport and given to [employer] nurse.

## 2022-01-25 NOTE — Discharge Summary (Signed)
Physician Discharge Summary   Patient: Hunter Smith MRN: [id number] DOB: June 04, 1960  Admit date:     01/18/2022  Discharge date: 01/25/22  Discharge Physician: Barton Dubois   PCP: Patient, No Pcp Per   Recommendations at discharge:  Patient accepted to [facility] with current antibiotics, antifungals and follow culture results from bronchoscopy -Currently on ARDS protocol as part of ventilatory support; wean off as tolerated. -Further recommendations to be given from his immunosuppressive therapy/history of leukemia and MDS to be dictated by his oncology service.  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  He has had some posttussive emesis.  He has had subjective fevers and chills.  He has a headache but denies any visual disturbance.  He denies any hemoptysis, hematemesis, abdominal pain, dysuria, hematuria, hematochezia, melena. He went to his PCP on the morning of 01/18/2022.  He was noted to be hypoxic in the low 80s.  He was sent to the emergency department for further evaluation. In the ED, the patient had low-grade temperature of 100.2 F.  He was tachycardic into the 120s.  He was hemodynamically stable with oxygen saturation 84% on room air.  He was placed on 3 L.  WBC 4.6, hemoglobin 12.7, platelets 64,000.  Sodium 142, potassium 4.1, bicarbonate 31, serum creatinine 0.68.  AST 23, ALT 29, alk phos 378, total bilirubin 0.4.  The patient was initially started on ceftriaxone and azithromycin.  Assessment and  Plan: * Acute respiratory failure with hypoxia (HCC) -Presented with tachypnea and hypoxia 84% on room air at time of admission. -Patient in the requirement of BiPAP overnight/intermittently for 48 hours and despite initial improvement has now continue desaturating and worsening. Further work up has now demonstrated development of ARDS. -case discussed with PCCM, recommendations given for intubation and mechanical ventilation. -patient will continue current antibiotics, plan is for deep sedation, pronning, ARDS protocol and  Bronchoscopy evaluation. -anticipating slow recovery and with recommendations for transfer to higher level of care [facility]) -continue supportive care and follow rec's by PCCM -Continue following closely I's and O's and daily weights. -continue PRN use of diuretics.  Lobar pneumonia ([place]) -Given the patient's immunocompromise state and frequent hospitalizations--continue broad IV antibiotics -Follow culture results -MRSA screen negative.  Severe sepsis ([place]) -Presented with tachypnea, tachycardia, and respiratory failure with hypoxia -Secondary to pneumonia -Lactic acid peaked 1.4 PCT 0.13 -Continue to maintain adequate hydration; now orally at that is presence of vascular congestion and worsening respiratory distress and what appears to be interstitial edema. -Chest x-ray demonstrated slight improvement in aeration initially; with now further decompensation on repeat images suggesting ARDS process.  -patient has required to be intubated. -will continue current IV antibiotics and supportive care.    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[place]) -Patient has schedule outpatient follow-up with hematology/oncology at [facility] on (02/01/2022) -will try to transfer him there. 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 -  At this moment will hold Lovenox -SCDs for DVT prophylaxis.   PCCM recommendations -Using propofol and fentanyl drip for deep sedation goal RASS -3 to -4 with Versed intermittently for breakthrough. 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 -Would recommend proning , able to achieve vent synchrony with deep sedation so paralytic may not be necessary -BAL performed at bedside and specimen sent for culture, respiratory viral panel, AFB, fungal and cell count and cytology , no evidence of alveolar hemorrhage -Continue cefepime -Add micafungin while awaiting BAL data -Increase empiric Solu-Medrol 40 every 12 -empirically started on antifungal medication.   Consultants: PCCM Procedures performed: bronchoscopy, see below for x-ray reports.   Disposition: to be transfer to [facility] for further evaluation and management.  Diet recommendation: NPO  DISCHARGE MEDICATION: Allergies as of 01/25/2022   No Known Allergies      Medication List     STOP taking these medications    albuterol 108 (90 Base) MCG/ACT inhaler Commonly known as: VENTOLIN HFA   amoxicillin 500 MG capsule Commonly known as: AMOXIL   azaCITIDine 5 mg/2 mLs in lactated ringers infusion   b complex vitamins capsule   budesonide 3 MG 24 hr capsule Commonly known as: ENTOCORT EC   CALCIUM/VITAMIN D PO   docusate sodium 100 MG capsule Commonly known as: COLACE Replaced by: docusate 50 MG/5ML liquid   fluconazole 200 MG tablet Commonly known as: DIFLUCAN   lidocaine-prilocaine cream Commonly known as: EMLA   MAGNESIUM GLYCINATE PO   Melatonin 5 MG Caps   multivitamin tablet   ondansetron 8 MG disintegrating tablet Commonly known as: ZOFRAN-ODT   pantoprazole 40 MG tablet Commonly known as:  PROTONIX Replaced by: pantoprazole 40 MG injection   PARoxetine 25 MG 24 hr tablet Commonly known as: PAXIL-CR Replaced by: PARoxetine 10 MG tablet   penicillin v potassium 500 MG tablet Commonly known as: VEETID   prochlorperazine 10 MG tablet Commonly known as: COMPAZINE   tiZANidine 4 MG tablet Commonly known as: ZANAFLEX   ursodiol 300 MG capsule Commonly known as: ACTIGALL       TAKE these medications    acetaminophen 325 MG tablet Commonly known as: TYLENOL Take 2 tablets (650 mg total) by mouth every 6 (six) hours as needed for mild pain (or Fever >/= 101).   acyclovir 800 MG tablet Commonly known as: ZOVIRAX Take 1 tablet (800 mg total) by mouth 2 (two) times daily.   budesonide 0.5 MG/2ML nebulizer solution Commonly known as: PULMICORT Take 2 mLs (0.5 mg total) by nebulization 2 (two) times daily.   ceFEPIme 2 g in sodium chloride 0.9 % 100 mL Inject 2 g into the vein every 8 (eight) hours.   Chlorhexidine Gluconate Cloth 2 % Pads Apply 6 each topically daily. Start taking on: January 26, 2022   dextromethorphan 30 MG/5ML liquid Commonly known as: DELSYM Place 5 mLs (30 mg total) into feeding tube every 12 (twelve) hours as needed for cough (refractory coughing spells.).   docusate 50 MG/5ML liquid Commonly known as: COLACE Place 10 mLs (100 mg total) into feeding tube 2 (two) times daily. Replaces: docusate sodium 100 MG capsule   feeding supplement (PROSource TF20) liquid Place 60 mLs into feeding tube daily. Start taking on: January 26, 2022   feeding supplement (VITAL HIGH PROTEIN) Liqd liquid Place 1,000 mLs into feeding tube daily. Start taking on: January 26, 2022   fentaNYL 10 mcg/ml Soln  infusion Inject 0-400 mcg/hr into the vein continuous.   fentaNYL Soln Commonly known as: SUBLIMAZE Inject 50-100 mcg into the vein every 15 (fifteen) minutes as needed (to maintain RASS & CPOT goal.).   folic acid 1 MG tablet Commonly known  as: FOLVITE Place 1 tablet (1 mg total) into feeding tube at bedtime. What changed: how to take this   HYDROcodone-acetaminophen 5-325 MG tablet Commonly known as: NORCO/VICODIN Place 1 tablet into feeding tube every 6 (six) hours as needed for moderate pain.   hydrOXYzine 25 MG tablet Commonly known as: ATARAX Place 1 tablet (25 mg total) into feeding tube 3 (three) times daily as needed for anxiety.   levalbuterol 0.63 MG/3ML nebulizer solution Commonly known as: XOPENEX Take 3 mLs (0.63 mg total) by nebulization every 6 (six) hours.   loratadine 10 MG tablet Commonly known as: CLARITIN Place 1 tablet (10 mg total) into feeding tube daily. Start taking on: January 26, 2022   methylPREDNISolone sodium succinate 125 mg/2 mL injection Commonly known as: SOLU-MEDROL Inject 1.28 mLs (80 mg total) into the vein daily. Start taking on: January 26, 2022   micafungin in sodium chloride 0.9 % 100 mL 100 mg IV every 24 hours.   midazolam 2 MG/2ML Soln injection Commonly known as: VERSED Inject 2 mLs (2 mg total) into the vein every 2 (two) hours as needed (to maintain RASS goal.).   midodrine 10 MG tablet Commonly known as: PROAMATINE Place 1 tablet (10 mg total) into feeding tube 3 (three) times daily with meals.   mouth rinse Liqd solution 15 mLs by Mouth Rinse route every 2 (two) hours.   mouth rinse Liqd solution 15 mLs by Mouth Rinse route as needed (oral care).   ondansetron 4 MG tablet Commonly known as: ZOFRAN Place 1 tablet (4 mg total) into feeding tube every 6 (six) hours as needed for nausea. What changed:  medication strength how much to take how to take this when to take this reasons to take this   pantoprazole 40 MG injection Commonly known as: PROTONIX Inject 40 mg into the vein daily. Start taking on: January 26, 2022 Replaces: pantoprazole 40 MG tablet   PARoxetine 10 MG tablet Commonly known as: PAXIL Place 1 tablet (10 mg total) into  feeding tube 2 (two) times daily. Replaces: PARoxetine 25 MG 24 hr tablet   polyethylene glycol 17 g packet Commonly known as: MIRALAX / GLYCOLAX Place 17 g into feeding tube daily. Start taking on: January 26, 2022   polyvinyl alcohol 1.4 % ophthalmic solution Commonly known as: LIQUIFILM TEARS Place 1 drop into both eyes as needed for dry eyes.   propofol 1000 MG/100ML Emul injection Commonly known as: DIPRIVAN Inject 423-6,768 mcg/min into the vein continuous.        Discharge Exam: Filed Weights   01/19/22 0412 01/24/22 0654 01/25/22 0500  Weight: 83.2 kg 82.5 kg 84.6 kg   General exam: Intubated, sedated and mechanically ventilated.  Vent mode: PRVC, sed rate 30, VT set 450, PEEP 10, peak airway pressure 35, mean airway pressure 19, plateau pressure 32 and FiO2 100% Respiratory system: Positive rhonchi bilaterally; decreased breath sounds at the bases.  Positive tachypnea appreciated.  Mild expiratory wheezing appreciated. Cardiovascular system:RRR. No murmurs, rubs, gallops.  No JVD. Gastrointestinal system: Abdomen is nondistended, soft and nontender. No organomegaly or masses felt. Normal bowel sounds heard. Central nervous system: Unable to properly assess due to current sedation.  Following commands appropriately. Extremities: No cyanosis or clubbing. Skin:  No petechiae. Psychiatry: To properly assess due to current sedation.  Condition at discharge: stable  The results of significant diagnostics from this hospitalization (including imaging, microbiology, ancillary and laboratory) are listed below for reference.   Imaging Studies: DG Chest Portable 1 View  Result Date: 01/25/2022 CLINICAL DATA:  Endotracheal tube placement EXAM: PORTABLE CHEST 1 VIEW COMPARISON:  Radiograph 01/25/2022 at 612 hours FINDINGS: Endotracheal tube position 1.8 cm from carina. Placement NG tube with tube in stomach. Tip of the tube is below the margin the film. Port in the anterior chest  wall with tip in distal SVC. Bilateral airspace disease unchanged from prior. IMPRESSION: NG tube and endotracheal tube appear in good position. Electronically Signed   By: Suzy Bouchard M.D.   On: 01/25/2022 11:56   DG CHEST PORT 1 VIEW  Result Date: 01/25/2022 CLINICAL DATA:  61 year old male with history of increasing shortness of breath. EXAM: PORTABLE CHEST 1 VIEW COMPARISON:  Chest x-ray 01/23/2022. FINDINGS: Right internal jugular single-lumen power porta cath with tip terminating in the distal superior vena cava. There is cephalization of the pulmonary vasculature, indistinctness of the interstitial markings, and patchy airspace disease throughout the lungs bilaterally suggestive of moderate pulmonary edema. Small bilateral pleural effusions. Mild cardiomegaly. Upper mediastinal contours are distorted by patient positioning. IMPRESSION: 1. The appearance the chest is most suggestive of congestive heart failure, although severe multilobar bilateral pneumonia could have a similar appearance. Electronically Signed   By: Vinnie Langton M.D.   On: 01/25/2022 06:20   DG CHEST PORT 1 VIEW  Result Date: 01/23/2022 CLINICAL DATA:  History of myelodysplastic syndrome. Status post bone marrow transplant. Worsening cough and shortness of breath. EXAM: PORTABLE CHEST 1 VIEW COMPARISON:  01/21/2022 FINDINGS: Right chest wall port a catheter noted with tip at the cavoatrial junction. Stable cardiomediastinal contours. Unchanged small left pleural effusion. Bilateral upper lung zone predominant interstitial and airspace opacities are again noted. Compared with the previous exam the aeration to the upper lung zones appears slightly improved in the interval. IMPRESSION: Slight improvement in aeration to the upper lung zones compared with previous exam. Electronically Signed   By: Kerby Moors M.D.   On: 01/23/2022 09:16   DG Chest Port 1 View  Result Date: 01/21/2022 CLINICAL DATA:  Hypoxia EXAM: PORTABLE  CHEST 1 VIEW COMPARISON:  01/19/2022 CTA chest FINDINGS: Severe biapical consolidation. Right chest wall Port-A-Cath tip in the lower SVC. Mild cardiomegaly. IMPRESSION: Severe biapical consolidation. Electronically Signed   By: Ulyses Jarred M.D.   On: 01/21/2022 03:40   CT Angio Chest Pulmonary Embolism (PE) W or WO Contrast  Result Date: 01/19/2022 CLINICAL DATA:  Chest pain with shortness of breath. EXAM: CT ANGIOGRAPHY CHEST WITH CONTRAST TECHNIQUE: Multidetector CT imaging of the chest was performed using the standard protocol during bolus administration of intravenous contrast. Multiplanar CT image reconstructions and MIPs were obtained to evaluate the vascular anatomy. RADIATION DOSE REDUCTION: This exam was performed according to the departmental dose-optimization program which includes automated exposure control, adjustment of the mA and/or kV according to patient size and/or use of iterative reconstruction technique. CONTRAST:  29m OMNIPAQUE IOHEXOL 350 MG/ML SOLN COMPARISON:  None Available. FINDINGS: Cardiovascular: Satisfactory opacification of the pulmonary arteries to the segmental level. No evidence of pulmonary embolism. Normal heart size. There is a small pericardial effusion. Mediastinum/Nodes: There are enlarged right hilar lymph nodes measuring up to 16 mm. There is an enlarged subcarinal lymph node measuring 11 mm. Visualized esophagus and thyroid gland are within  normal limits. Lungs/Pleura: There is bilateral patchy airspace consolidation throughout both lungs most prominent in the bilateral upper lobes, right greater than left. There are small bilateral pleural effusions. There is some sparing of the lung bases. Trachea and central airways are patent. There is no evidence for pneumothorax. Upper Abdomen: No acute abnormality. Musculoskeletal: There are mild chronic compression deformities of T8 and T9. No acute fractures are seen. Review of the MIP images confirms the above findings.  IMPRESSION: 1. No evidence for pulmonary embolism. 2. Small pericardial effusion. 3. Bilateral patchy airspace consolidation most significant in the bilateral upper lobes. Findings may be related to pulmonary edema, hemorrhage, and/or infection. 4. Small pleural effusions. 5. Subcarinal and right hilar lymphadenopathy. Electronically Signed   By: Ronney Asters M.D.   On: 01/19/2022 18:00   DG Chest 2 View  Result Date: 01/18/2022 CLINICAL DATA:  CMML, MDS, shortness of breath with cough and wheezing EXAM: CHEST - 2 VIEW COMPARISON:  02/16/2020 FINDINGS: Right subclavian power port catheter tip lower SVC level as before. Lower lung volumes with new bilateral mixed interstitial patchy airspace opacities worse in the upper lobes concerning for pneumonia, including atypical pneumonia. No large effusion or pneumothorax. Trachea midline. Stable mild cardiac enlargement. Degenerative changes noted spine. IMPRESSION: Bilateral mixed interstitial and airspace opacities worse in the upper lobes compatible with pneumonia. See above comment. Electronically Signed   By: Jerilynn Mages.  Shick M.D.   On: 01/18/2022 13:37    Microbiology: Results for orders placed or performed during the hospital encounter of 01/18/22  SARS Coronavirus 2 by RT PCR (hospital order, performed in [facility] hospital lab) *cepheid single result test* Anterior Nasal Swab     Status: None   Collection Time: 01/18/22  1:53 PM   Specimen: Anterior Nasal Swab  Result Value Ref [place] Status   SARS Coronavirus 2 by RT PCR NEGATIVE NEGATIVE Final    Comment: (NOTE) SARS-CoV-2 target nucleic acids are NOT DETECTED.  The SARS-CoV-2 RNA is generally detectable in upper and lower respiratory specimens during the acute phase of infection. The lowest concentration of SARS-CoV-2 viral copies this assay can detect is 250 copies / mL. A negative result does not preclude SARS-CoV-2 infection and should not be used as the sole basis for treatment or  other patient management decisions.  A negative result may occur with improper specimen collection / handling, submission of specimen other than nasopharyngeal swab, presence of viral mutation(s) within the areas targeted by this assay, and inadequate number of viral copies (<250 copies / mL). A negative result must be combined with clinical observations, patient history, and epidemiological information.  Fact Sheet for Patients:   https://www.patel.info/  Fact Sheet for Healthcare Providers: https://hall.com/  This test is not yet approved or  cleared by the [place] FDA and has been authorized for detection and/or diagnosis of SARS-CoV-2 by FDA under an Emergency Use Authorization (EUA).  This EUA will remain in effect (meaning this test can be used) for the duration of the COVID-19 declaration under Section 564(b)(1) of the Act, 21 U.S.C. section 360bbb-3(b)(1), unless the authorization is terminated or revoked sooner.  Performed at [facility], [address] [place], [place] [address]   Culture, blood (single)     Status: None   Collection Time: 01/18/22  3:04 PM   Specimen: Right Antecubital; Blood  Result Value Ref [place] Status   Specimen Description RIGHT ANTECUBITAL  Final   Special Requests   Final    BOTTLES DRAWN AEROBIC ONLY  Blood Culture results may not be optimal due to an excessive volume of blood received in culture bottles   Culture   Final    NO GROWTH 5 DAYS Performed at [facility], [name] [place], [place] [address]    Report Status 01/23/2022 FINAL  Final  MRSA Next Gen by PCR, Nasal     Status: None   Collection Time: 01/19/22  9:36 AM   Specimen: Nasal Mucosa; Nasal Swab  Result Value Ref [place] Status   MRSA by PCR Next Gen NOT DETECTED NOT DETECTED Final    Comment: (NOTE) The GeneXpert MRSA Assay (FDA approved for NASAL specimens only), is one component of a comprehensive MRSA colonization  surveillance program. It is not intended to diagnose MRSA infection nor to guide or monitor treatment for MRSA infections. Test performance is not FDA approved in patients less than 55 years old. Performed at [facility], [address] [place], [place] [address]   MRSA Next Gen by PCR, Nasal     Status: None   Collection Time: 01/20/22 11:36 PM   Specimen: Nasal Mucosa; Nasal Swab  Result Value Ref [place] Status   MRSA by PCR Next Gen NOT DETECTED NOT DETECTED Final    Comment: (NOTE) The GeneXpert MRSA Assay (FDA approved for NASAL specimens only), is one component of a comprehensive MRSA colonization surveillance program. It is not intended to diagnose MRSA infection nor to guide or monitor treatment for MRSA infections. Test performance is not FDA approved in patients less than 17 years old. Performed at [facility], [address], [place], [place] [address]   Expectorated Sputum Assessment w Gram Stain, Rflx to Resp Cult     Status: None   Collection Time: 01/21/22  3:58 AM   Specimen: Sputum  Result Value Ref [place] Status   Specimen Description SPUTUM  Final   Special Requests NONE  Final   Sputum evaluation   Final    THIS SPECIMEN IS ACCEPTABLE FOR SPUTUM CULTURE Performed at [facility], [address], [place], [place] [address]    Report Status 01/22/2022 FINAL  Final  Culture, Respiratory w Gram Stain     Status: None   Collection Time: 01/21/22  3:58 AM   Specimen: SPU  Result Value Ref [place] Status   Specimen Description   Final    SPUTUM Performed at [facility], [address], [place], [place] [address]    Special Requests   Final    NONE Reflexed from [id number] Performed at [facility], [address] [place], [place] [address]    Gram Stain   Final    FEW SQUAMOUS EPITHELIAL CELLS PRESENT FEW WBC PRESENT,BOTH PMN AND MONONUCLEAR MODERATE YEAST WITH PSEUDOHYPHAE FEW GRAM NEGATIVE RODS FEW GRAM POSITIVE RODS    Culture   Final    MODERATE CANDIDA  ALBICANS NO STAPHYLOCOCCUS AUREUS ISOLATED No Pseudomonas species isolated Performed at [facility], [place] [address], [place], [place] [address]    Report Status 01/25/2022 FINAL  Final    Labs: CBC: Recent Labs  Lab 01/18/22 1955 01/19/22 0446 01/20/22 0613 01/21/22 0510 01/25/22 0452  WBC 4.2 4.6 3.3* 5.7 5.6  NEUTROABS  --   --  1.9  --   --   HGB 11.6* 12.7* 15.8 11.1* 10.8*  HCT 34.4* 38.2* 47.4 33.8* 32.8*  MCV 116.6* 117.2* 115.6* 117.8* 116.7*  PLT 62* 64* 35* 49* 47*   Basic Metabolic Panel: Recent Labs  Lab 01/18/22 1955 01/19/22  4193 01/20/22 7902 01/21/22 0510 01/22/22 0525 01/23/22 0400 01/25/22 0452  NA  --    < > 136 135 138 139 140  K  --    < > 3.9 4.3 3.7 3.4* 3.5  CL  --    < > 102 100 98 100 98  CO2  --    < > 30 33* 35* 32 35*  GLUCOSE  --    < > 129* 136* 144* 146* 131*  BUN  --    < > _0 CREATININE 0.79   < > 0.60* 0.55* 0.56* 0.54* 0.47*  CALCIUM  --    < > 8.1* 8.4* 9.0 8.7* 8.6*  MG 2.0  --  1.8  --   --   --   --    < > = values in this interval not displayed.   Liver Function Tests: Recent Labs  Lab 01/19/22 0446 01/21/22 0510 01/22/22 0525  AST _1 ALT _2 ALKPHOS 78 70 67  BILITOT 0.4 0.8 1.0  PROT 5.1* 4.7* 5.1*  ALBUMIN 2.5* 2.2* 2.4*   CBG: Recent Labs  Lab 01/25/22 1211  GLUCAP 155*    Discharge time spent: greater than 30 minutes.  Signed: Barton Dubois, MD [facility] 01/25/2022

## 2022-01-25 NOTE — Progress Notes (Signed)
Ok to use OG tube per verbal from Dr Elsworth Soho.

## 2022-01-27 LAB — CULTURE, BAL-QUANTITATIVE W GRAM STAIN: Culture: 10000 — AB

## 2022-01-28 LAB — CYTOLOGY - NON PAP

## 2022-01-30 LAB — ACID FAST SMEAR (AFB, MYCOBACTERIA): Acid Fast Smear: NEGATIVE

## 2022-03-03 LAB — FUNGUS CULTURE RESULT

## 2022-03-03 LAB — FUNGUS CULTURE WITH STAIN

## 2022-03-03 LAB — FUNGAL ORGANISM REFLEX

## 2022-03-11 ENCOUNTER — Other Ambulatory Visit (HOSPITAL_COMMUNITY): Payer: Self-pay | Admitting: Family Medicine

## 2022-03-11 DIAGNOSIS — J982 Interstitial emphysema: Secondary | ICD-10-CM

## 2022-03-13 LAB — ACID FAST CULTURE WITH REFLEXED SENSITIVITIES (MYCOBACTERIA): Acid Fast Culture: NEGATIVE

## 2022-03-19 ENCOUNTER — Ambulatory Visit (HOSPITAL_COMMUNITY): Payer: 59

## 2022-03-19 ENCOUNTER — Encounter (HOSPITAL_COMMUNITY): Payer: Self-pay

## 2022-03-30 ENCOUNTER — Ambulatory Visit (HOSPITAL_COMMUNITY): Payer: 59

## 2022-03-30 ENCOUNTER — Ambulatory Visit (HOSPITAL_COMMUNITY): Payer: 59 | Admitting: Occupational Therapy

## 2022-04-05 ENCOUNTER — Ambulatory Visit (HOSPITAL_COMMUNITY): Payer: 59 | Admitting: Occupational Therapy

## 2022-04-05 ENCOUNTER — Ambulatory Visit (HOSPITAL_COMMUNITY): Payer: 59 | Attending: Nurse Practitioner | Admitting: Physical Therapy

## 2022-04-05 DIAGNOSIS — R278 Other lack of coordination: Secondary | ICD-10-CM | POA: Diagnosis present

## 2022-04-05 DIAGNOSIS — M6281 Muscle weakness (generalized): Secondary | ICD-10-CM | POA: Diagnosis not present

## 2022-04-05 DIAGNOSIS — R29898 Other symptoms and signs involving the musculoskeletal system: Secondary | ICD-10-CM | POA: Diagnosis present

## 2022-04-05 NOTE — Therapy (Signed)
OUTPATIENT OCCUPATIONAL THERAPY ORTHO EVALUATION  Patient Name: Hunter Smith MRN: [id number] DOB:May 14, 1960, 61 y.o., male Today's Date: 04/06/2022  PCP: Wende Neighbors, MD REFERRING PROVIDER: Debroah Baller, FNP  END OF SESSION:  OT End of Session - 04/06/22 1320     Visit Number 1    Number of Visits 9    Date for OT Re-Evaluation 06/04/22    Authorization Type [employer], No Co-Pay    Authorization Time Period 40 visit limit    Progress Note Due on Visit 10    OT Start Time 1350    OT Stop Time 1430    OT Time Calculation (min) 40 min    Activity Tolerance Patient tolerated treatment well    Behavior During Therapy [facility] for tasks assessed/performed             Past Medical History:  Diagnosis Date   Cancer ([place])    Port-A-Cath in place 12/28/2019   Past Surgical History:  Procedure Laterality Date   COLONOSCOPY     PORTACATH PLACEMENT Right 01/18/2020   Procedure: INSERTION PORT-A-CATH;  Surgeon: Aviva Signs, MD;  Location: [facility];  Service: General;  Laterality: Right;   Patient Active Problem List   Diagnosis Date Noted   Lobar pneumonia ([place]) 01/19/2022   Severe sepsis ([place]) 01/18/2022   Acute respiratory failure with hypoxia ([place]) 01/18/2022   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[place]) 04/07/2020   Myelodysplastic syndrome, high grade ([place]) 12/28/2019   Goals of care, counseling/discussion 12/28/2019   Port-A-Cath in place 12/28/2019   Macrocytic anemia 12/12/2019   Thrombocytopenia ([place]) 12/12/2019    ONSET DATE: 01/18/22  REFERRING DIAG: Generalized Weakness  THERAPY DIAG:  Other symptoms and signs involving the musculoskeletal system  Other lack of coordination  Muscle weakness (generalized)  Rationale for Evaluation and Treatment: Rehabilitation  SUBJECTIVE:   SUBJECTIVE STATEMENT: "I just have no strength for anything" Pt accompanied by: self and significant other  PERTINENT HISTORY: 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 Started PT in  July 2023. Pt diagnosed with Pneumonia in ED on 01/18/22. Pt was in Inpatient Rehab at the [facility] for 12 days in October 2023.   PRECAUTIONS: Fall  WEIGHT BEARING RESTRICTIONS: No  PAIN:  Are you having pain? No  FALLS: Has patient fallen in last 6 months? No  LIVING ENVIRONMENT: Lives with: lives with their spouse Lives in: House/apartment Has following equipment at home: Gilford Rile - 2 wheeled, Wheelchair (manual), shower chair, bed side commode, and Grab bars  PLOF: Independent  PATIENT GOALS: To get stronger  NEXT MD VISIT: Every Other Monday, 04/12/22  OBJECTIVE:   HAND DOMINANCE: Right  ADLs: Overall ADLs: Pt is able to complete basic ADL's, however is unable to lift anything heavier than 5lbs. Unable to complete IADL's such as cooking and cleaning. Requires frequent rest breaks.    FUNCTIONAL OUTCOME MEASURES: Quick Dash: 47.73  UPPER EXTREMITY ROM:     Active ROM Right eval Left eval  Shoulder flexion 117 110  Shoulder abduction 134 126  Shoulder internal rotation 90 90  Shoulder external rotation 60 50  (Blank rows = not tested)   UPPER EXTREMITY MMT:     MMT Right eval Left eval  Shoulder flexion 4/5 4/5  Shoulder abduction 4/5 4/5  Shoulder adduction 4+/5 4/5  Shoulder extension 4/5 4/5  Shoulder internal rotation 3/5 3/5  Shoulder external rotation 3/5 3/5  Elbow flexion 3/5 3/5  Elbow extension 3/5 3/5  Wrist flexion 4/5 3/5  Wrist extension 4/5  3/5  Wrist ulnar deviation 4+/5 3/5  Wrist radial deviation 4+/5 3/5  Wrist pronation 4+/5 3/5  Wrist supination 4/5 3/5  (Blank rows = not tested)  HAND FUNCTION: Grip strength: Right: - lbs; Left: - lbs, Lateral pinch: Right: - lbs, Left: - lbs, and 3 point pinch: Right: - lbs, Left: - lbs ** Will test next session  COORDINATION: 9 Hole Peg test: Right: - sec; Left: - sec ** will test next session  SENSATION: Pt has burning and extremely sensitive to all sensations  EDEMA: no  swelling noted  OBSERVATIONS: Able to make full fists, tremors noted in BUE   TODAY'S TREATMENT:                                                                                                                              DATE: 04/05/22: Evaluation    PATIENT EDUCATION: Education details: Table Slides Person educated: Patient Education method: Explanation and Demonstration Education comprehension: verbalized understanding and returned demonstration  HOME EXERCISE PROGRAM: 11/27: Table Slides  GOALS: Goals reviewed with patient? Yes  SHORT TERM GOALS: Target date: 05/07/22  Pt will be provided with and educated on HEP to improve mobility in BUE required for ADL completion. Goal status: INITIAL  2.  Pt will decrease pain to average 3/10 or less in order to sleep for 3+ consecutive hours without waking due to pain.  Goal status: INITIAL  3.  Pt will improve A/ROM by 15 degrees in all directions to improve ability to reach up into cabinets for meal prep. Goal status: INITIAL  4.  Pt will improve strength to 4/5 to improve ability to gather clothes or items needed for dressing/bathing and hold them without dropping them due to weakness. Goal status: INITIAL  LONG TERM GOALS: Target date: 06/04/21  Pt will decrease fascial restrictions to minimal amounts or less to improve mobility required for overhead reaching tasks.  Goal status: INITIAL  2.  Pt will improve A/ROM to [facility] in BUE, in all directions to improve ability to reach over head and behind back during dressing and bathing tasks.  Goal status: INITIAL  3.  Pt will improve strength to 4+/5 in BUE to improve ability to perform lifting tasks required for cooking and cleaning tasks.  Goal status: INITIAL  4.  Pt will increase BUE grip strength by 10# and pinch strength by 3# to improve ability to grasp and hold pots and pans during meal preparation.  Goal status: INITIAL   ASSESSMENT:  CLINICAL IMPRESSION: Patient is  a 61 y.o. M who was seen today for occupational therapy evaluation for increased weakness s/p hospitalization. Prior to his hospitalization, pt reported that he was independent with all ADL's and IADL's, using no AD. At this time, pt demonstrates increased weakness, coordination deficits, and decreased ROM, as well as decreased endurance. Pt will benefit from skilled OT to maximize concerns listed above.  PERFORMANCE DEFICITS: in functional skills including ADLs, IADLs,  coordination, ROM, strength, pain, fascial restrictions, Fine motor control, Gross motor control, body mechanics, and UE functional use.  IMPAIRMENTS: are limiting patient from ADLs, IADLs, work, leisure, and social participation.   COMORBIDITIES: has no other co-morbidities that affects occupational performance. Patient will benefit from skilled OT to address above impairments and improve overall function.  MODIFICATION OR ASSISTANCE TO COMPLETE EVALUATION: No modification of tasks or assist necessary to complete an evaluation.  OT OCCUPATIONAL PROFILE AND HISTORY: Problem focused assessment: Including review of records relating to presenting problem.  CLINICAL DECISION MAKING: LOW - limited treatment options, no task modification necessary  REHAB POTENTIAL: Excellent  EVALUATION COMPLEXITY: Low      PLAN:  OT FREQUENCY: 1x/week  OT DURATION: 8 weeks  PLANNED INTERVENTIONS: self care/ADL training, therapeutic exercise, therapeutic activity, neuromuscular re-education, manual therapy, passive range of motion, moist heat, cryotherapy, patient/family education, and DME and/or AE instructions  RECOMMENDED OTHER SERVICES: PT  CONSULTED AND AGREED WITH PLAN OF CARE: Patient  PLAN FOR NEXT SESSION: Manual, P/ROM, AA/ROM, A/ROM, isometrics, wall slides  Paulita Fujita, OTR/L [facility] [place], [place] 04/06/2022, 1:25 PM

## 2022-04-05 NOTE — Patient Instructions (Signed)

## 2022-04-05 NOTE — Therapy (Signed)
OUTPATIENT PHYSICAL THERAPY NEURO EVALUATION   Patient Name: Hunter Smith MRN: [id number] DOB:[birth year], 61 y.o., male Today's Date: 04/05/2022   PCP: Delphina Cahill MD REFERRING PROVIDER: Debroah Baller, NP  END OF SESSION:  PT End of Session - 04/05/22 1341     Visit Number 1    Number of Visits 16    Date for PT Re-Evaluation 05/31/22    Authorization Type [employer] ( 40 visit limit/ 17 remain as of eval)    Authorization - Visit Number 1    Authorization - Number of Visits 17    Progress Note Due on Visit 10    PT Start Time 7829    PT Stop Time 1343    PT Time Calculation (min) 38 min    Activity Tolerance Patient tolerated treatment well;Patient limited by fatigue    Behavior During Therapy [facility] for tasks assessed/performed             Past Medical History:  Diagnosis Date   Cancer ([place])    Port-A-Cath in place 12/28/2019   Past Surgical History:  Procedure Laterality Date   COLONOSCOPY     PORTACATH PLACEMENT Right 01/18/2020   Procedure: INSERTION PORT-A-CATH;  Surgeon: Aviva Signs, MD;  Location: [facility];  Service: General;  Laterality: Right;   Patient Active Problem List   Diagnosis Date Noted   Lobar pneumonia ([place]) 01/19/2022   Severe sepsis ([place]) 01/18/2022   Acute respiratory failure with hypoxia ([place]) 01/18/2022   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[place]) 04/07/2020   Myelodysplastic syndrome, high grade ([place]) 12/28/2019   Goals of care, counseling/discussion 12/28/2019   Port-A-Cath in place 12/28/2019   Macrocytic anemia 12/12/2019   Thrombocytopenia ([place]) 12/12/2019    ONSET DATE: Ongoing  REFERRING DIAG: PT eval/tx for functional mobility, txfers, safety, balance,  THERAPY DIAG:  Muscle weakness (generalized)  Rationale for Evaluation and Treatment: Rehabilitation  SUBJECTIVE:                                                                                                                                                                                              SUBJECTIVE STATEMENT: Patient presents to therapy with complaint of weakness, balance and gait deficits following an extended hospital stay for respiratory failure. He was DC form hospital 03/02/22. He had home health care for about 2 weeks following this hospital stay. He has been having issues with legs, balance, walking and weakness in LES.  Pt accompanied by:  wife [name], academic)  PERTINENT HISTORY: neuropathy, HX of cancer  PAIN:  Are you having pain? Yes: NPRS scale: 3/10 Pain location: knees, abdomen   Pain description:  dull aching in knees, pressure in abdomen  Aggravating factors: WB, walking  Relieving factors: meds   PRECAUTIONS: None (no driving)  WEIGHT BEARING RESTRICTIONS: No  FALLS: Has patient fallen in last 6 months? No  LIVING ENVIRONMENT: Lives with: lives with their spouse Lives in: House/apartment Stairs: Yes: External: 14 steps; bilateral but cannot reach both Has following equipment at home: Single point cane, Walker - 2 wheeled, Wheelchair (manual), shower chair, and bed side commode  PLOF: Independent with basic ADLs  PATIENT GOALS: get stronger, walk without AD   OBJECTIVE:   DIAGNOSTIC FINDINGS: NA  COGNITION: Overall cognitive status: Within functional limits for tasks assessed   SENSATION: Reports history of peripheral neuropathy    LOWER EXTREMITY MMT:    MMT Right Eval Left Eval  Hip flexion 4- 4-  Hip extension 3- 3-  Hip abduction 3+ 3+  Hip adduction    Hip internal rotation    Hip external rotation    Knee flexion    Knee extension 3+ 3+  Ankle dorsiflexion 3+ 3+  Ankle plantarflexion    Ankle inversion    Ankle eversion    (Blank rows = not tested)  BED MOBILITY:  Sit to supine Modified independence Supine to sit Modified independence Rolling to Right Modified independence Rolling to Left Modified independence  TRANSFERS: Assistive device utilized: None  Sit to stand: Modified independence and  SBA Stand to sit: Modified independence and SBA Chair to chair: Modified independence and SBA   GAIT: Decreased stride, slightly flexed trunk, using RW   FUNCTIONAL TESTS:  5 times sit to stand: 20.8 sec with UE  Timed up and go (TUG): 19.27 sec with RW   TODAY'S TREATMENT:                                                                                                                              DATE: 04/05/22  Eval     PATIENT EDUCATION: Education details: on eval findings, POC and HEP Person educated: Patient Education method: Explanation Education comprehension: verbalized understanding  HOME EXERCISE PROGRAM: Access Code: [email or web address] URL: [email or web address] Date: 04/05/2022 Prepared by: Josue Hector  Exercises - Seated March  - 2-3 x daily - 7 x weekly - 2 sets - 10 reps - Seated Long Arc Quad  - 2-3 x daily - 7 x weekly - 2 sets - 10 reps - Sit to Stand with Counter Support  - 2-3 x daily - 7 x weekly - 2 sets - 10 reps  GOALS: SHORT TERM GOALS: Target date: 05/03/2022  Patient will be independent with initial HEP and self-management strategies to improve functional outcomes Baseline:  Goal status: INITIAL   2.  Patient will improve STS to < 15 seconds to demo improved functional strength and reduced risk for falls.  Baseline: 20.88 sec with UEs Goal status: INITIAL  LONG TERM GOALS: Target date: 05/31/2022  Patient will be independent with advanced HEP and self-management strategies  to improve functional outcomes Baseline:  Goal status: INITIAL  2.  Patient will improve STS to < 12 seconds to demo improved functional strength and reduced risk for falls.  Baseline: 20.88 sec with UEs Goal status: INITIAL  3. Patient will have equal to or > 4/5 MMT throughout BLE to improve ability to perform functional mobility, stair ambulation and ADLs.  Baseline: See MMT Goal status: INITIAL  4. Patient will decreased TUG time to < 12 seconds using  LRAD to demonstrate improved balance and ability to perform functional mobility and associated tasks. Baseline: 19.27 sec with RW Goal status: INITIAL  ASSESSMENT:  CLINICAL IMPRESSION: Patient is a 61 y.o. male who presents to physical therapy with complaint of weakness/ gait and balance disturbance. Patient demonstrates decreased strength, balance deficits and gait abnormalities which are negatively impacting patient ability to perform ADLs and functional mobility tasks. Patient will benefit from skilled physical therapy services to address these deficits to improve level of function with ADLs, functional mobility tasks, and reduce risk for falls.    OBJECTIVE IMPAIRMENTS: Abnormal gait, decreased activity tolerance, decreased balance, decreased endurance, decreased mobility, difficulty walking, decreased strength, and pain.   ACTIVITY LIMITATIONS: carrying, lifting, bending, standing, squatting, stairs, transfers, bed mobility, and locomotion level  PARTICIPATION LIMITATIONS: cleaning, laundry, shopping, community activity, and yard work  PERSONAL FACTORS: Past/current experiences and Time since onset of injury/illness/exacerbation are also affecting patient's functional outcome.   REHAB POTENTIAL: Good  CLINICAL DECISION MAKING: Stable/uncomplicated  EVALUATION COMPLEXITY: Low  PLAN:  PT FREQUENCY: 2x/week  PT DURATION: 8 weeks  PLANNED INTERVENTIONS: Therapeutic exercises, Therapeutic activity, Neuromuscular re-education, Balance training, Gait training, Patient/Family education, Joint manipulation, Joint mobilization, Stair training, Aquatic Therapy, Dry Needling, Electrical stimulation, Spinal manipulation, Spinal mobilization, Cryotherapy, Moist heat, scar mobilization, Taping, Traction, Ultrasound, Biofeedback, Ionotophoresis '4mg'$ /ml Dexamethasone, and Manual therapy.   PLAN FOR NEXT SESSION: Progress functional LE and core strength. Increase activity tolerance, gait distance  and balance as able.   1:43 PM, 04/05/22 Josue Hector PT DPT  Physical Therapist with [facility]  [phone]

## 2022-04-06 ENCOUNTER — Encounter (HOSPITAL_COMMUNITY): Payer: Self-pay | Admitting: Occupational Therapy

## 2022-04-12 ENCOUNTER — Encounter (HOSPITAL_COMMUNITY): Payer: 59

## 2022-04-14 ENCOUNTER — Ambulatory Visit (HOSPITAL_COMMUNITY): Payer: 59 | Attending: Nurse Practitioner | Admitting: Physical Therapy

## 2022-04-14 ENCOUNTER — Encounter (HOSPITAL_COMMUNITY): Payer: 59

## 2022-04-14 DIAGNOSIS — R29898 Other symptoms and signs involving the musculoskeletal system: Secondary | ICD-10-CM | POA: Diagnosis present

## 2022-04-14 DIAGNOSIS — M6281 Muscle weakness (generalized): Secondary | ICD-10-CM | POA: Insufficient documentation

## 2022-04-14 DIAGNOSIS — R278 Other lack of coordination: Secondary | ICD-10-CM | POA: Diagnosis present

## 2022-04-14 NOTE — Therapy (Signed)
OUTPATIENT PHYSICAL THERAPY NEURO EVALUATION   Patient Name: Hunter Smith MRN: [id number] DOB:29-Jul-1960, 61 y.o., male Today's Date: 04/14/2022   PCP: Hunter Cahill MD REFERRING PROVIDER: Debroah Baller, NP  END OF SESSION:  PT End of Session - 04/14/22 1644     Visit Number 2    Number of Visits 16    Date for PT Re-Evaluation 05/31/22    Authorization Type [employer] ( 40 visit limit/ 17 remain as of eval)    Authorization - Number of Visits 17    Progress Note Due on Visit 10    PT Start Time 1645    PT Stop Time 1725    PT Time Calculation (min) 40 min    Activity Tolerance Patient tolerated treatment well;Patient limited by fatigue    Behavior During Therapy [facility] for tasks assessed/performed             Past Medical History:  Diagnosis Date   Cancer ([place])    Port-A-Cath in place 12/28/2019   Past Surgical History:  Procedure Laterality Date   COLONOSCOPY     PORTACATH PLACEMENT Right 01/18/2020   Procedure: INSERTION PORT-A-CATH;  Surgeon: Hunter Signs, MD;  Location: [facility];  Service: General;  Laterality: Right;   Patient Active Problem List   Diagnosis Date Noted   Lobar pneumonia ([place]) 01/19/2022   Severe sepsis ([place]) 01/18/2022   Acute respiratory failure with hypoxia ([place]) 01/18/2022   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   Myelodysplastic syndrome, high grade ([place]) 12/28/2019   Goals of care, counseling/discussion 12/28/2019   Port-A-Cath in place 12/28/2019   Macrocytic anemia 12/12/2019   Thrombocytopenia ([place]) 12/12/2019    ONSET DATE: Ongoing  REFERRING DIAG: PT eval/tx for functional mobility, txfers, safety, balance,  THERAPY DIAG:  Other symptoms and Smith involving the musculoskeletal system  Other lack of coordination  Muscle weakness (generalized)  Rationale for Evaluation and Treatment: Rehabilitation  SUBJECTIVE:                                                                                                                                                                                              SUBJECTIVE STATEMENT: Patient presents to therapy with complaint of weakness, balance and gait deficits following an extended hospital stay for respiratory failure. He was DC form hospital 03/02/22. He had home health care for about 2 weeks following this hospital stay. He has been having issues with legs, balance, walking and weakness in LES.  Pt accompanied by:  wife Hunter Smith)  PERTINENT HISTORY: neuropathy, HX of cancer  PAIN:  Are you having pain? Yes: NPRS scale: 3/10 Pain location:  knees, abdomen   Pain description: dull aching in knees, pressure in abdomen  Aggravating factors: WB, walking  Relieving factors: meds   PRECAUTIONS: None (no driving)  WEIGHT BEARING RESTRICTIONS: No  FALLS: Has patient fallen in last 6 months? No  LIVING ENVIRONMENT: Lives with: lives with their spouse Lives in: House/apartment Stairs: Yes: External: 14 steps; bilateral but cannot reach both Has following equipment at home: Single point cane, Walker - 2 wheeled, Wheelchair (manual), shower chair, and bed side commode  PLOF: Independent with basic ADLs  PATIENT GOALS: get stronger, walk without AD   OBJECTIVE:   DIAGNOSTIC FINDINGS: NA  COGNITION: Overall cognitive status: Within functional limits for tasks assessed   SENSATION: Reports history of peripheral neuropathy    LOWER EXTREMITY MMT:    MMT Right Eval Left Eval  Hip flexion 4- 4-  Hip extension 3- 3-  Hip abduction 3+ 3+  Hip adduction    Hip internal rotation    Hip external rotation    Knee flexion    Knee extension 3+ 3+  Ankle dorsiflexion 3+ 3+  Ankle plantarflexion    Ankle inversion    Ankle eversion    (Blank rows = not tested)  BED MOBILITY:  Sit to supine Modified independence Supine to sit Modified independence Rolling to Right Modified independence Rolling to Left Modified independence  TRANSFERS: Assistive device  utilized: None  Sit to stand: Modified independence and SBA Stand to sit: Modified independence and SBA Chair to chair: Modified independence and SBA   GAIT: Decreased stride, slightly flexed trunk, using RW   FUNCTIONAL TESTS:  5 times sit to stand: 20.8 sec with UE  Timed up and go (TUG): 19.27 sec with RW   TODAY'S TREATMENT:                                                                                                                              DATE:  04/14/22 Seated:  sit to stand from standard chair with 2" foam no UE 10X  Review of HEP:  LAQ and marching Standing:  Hip abduction 2X10 with UE assist  Hip extension 2X10 with UE assist  Alternating march 2X10 with UE assist  4" forward step ups 2X10 each with UE assist  4" lateral step ups 2X10 each with UE assist  04/05/22  Eval     PATIENT EDUCATION: Education details: on eval findings, POC and HEP Person educated: Patient Education method: Explanation Education comprehension: verbalized understanding  HOME EXERCISE PROGRAM: Access Code: [email or web address] URL: [email or web address]  Date: 04/14/2022 Prepared by: Hunter Smith Exercises - Standing Hip Abduction with Unilateral Counter Support  - 2 x daily - 7 x weekly - 1 sets - 10 reps - Standing Hip Extension  - 2 x daily - 7 x weekly - 1 sets - 10 reps - Standing March with Counter Support  - 2 x daily - 7 x weekly - 1 sets - 10 reps  Date: 04/05/2022 Prepared by: Hunter Smith Exercises - Seated March  - 2-3 x daily - 7 x weekly - 2 sets - 10 reps - Seated Long Arc Quad  - 2-3 x daily - 7 x weekly - 2 sets - 10 reps - Sit to Stand with Counter Support  - 2-3 x daily - 7 x weekly - 2 sets - 10 reps  GOALS: SHORT TERM GOALS: Target date: 05/03/2022  Patient will be independent with initial HEP and self-management strategies to improve functional outcomes Baseline:  Goal status: IN PROGRESS   2.  Patient will improve STS to < 15 seconds to demo  improved functional strength and reduced risk for falls.  Baseline: 20.88 sec with UEs Goal status: IN PROGRESS  LONG TERM GOALS: Target date: 05/31/2022  Patient will be independent with advanced HEP and self-management strategies to improve functional outcomes Baseline:  Goal status: IN PROGRESS  2.  Patient will improve STS to < 12 seconds to demo improved functional strength and reduced risk for falls.  Baseline: 20.88 sec with UEs Goal status: IN PROGRESS  3. Patient will have equal to or > 4/5 MMT throughout BLE to improve ability to perform functional mobility, stair ambulation and ADLs.  Baseline: See MMT Goal status: IN PROGRESS  4. Patient will decreased TUG time to < 12 seconds using LRAD to demonstrate improved balance and ability to perform functional mobility and associated tasks. Baseline: 19.27 sec with RW Goal status: IN PROGRESS  ASSESSMENT:  CLINICAL IMPRESSION: Goals reviewed as well as HEP.  Pt able to recall and demonstrate established HEP correctly.  Progressed with addition of standing hip strengthening and balance.  More weakness visible on Rt LE vs Lt.  Pt able to complete standing from standard chair without UE using 2" foam lift and several attempts.  Improved following several reps and able to complete all 10 repetitions.   Pt required intermittent rest breaks throughout session today.  Exercises added this session printed out and added to HEP today.  Patient will benefit from skilled physical therapy services to address these deficits to improve level of function with ADLs, functional mobility tasks, and reduce risk for falls.    OBJECTIVE IMPAIRMENTS: Abnormal gait, decreased activity tolerance, decreased balance, decreased endurance, decreased mobility, difficulty walking, decreased strength, and pain.   ACTIVITY LIMITATIONS: carrying, lifting, bending, standing, squatting, stairs, transfers, bed mobility, and locomotion level  PARTICIPATION LIMITATIONS:  cleaning, laundry, shopping, community activity, and yard work  PERSONAL FACTORS: Past/current experiences and Time since onset of injury/illness/exacerbation are also affecting patient's functional outcome.   REHAB POTENTIAL: Good  CLINICAL DECISION MAKING: Stable/uncomplicated  EVALUATION COMPLEXITY: Low  PLAN:  PT FREQUENCY: 2x/week  PT DURATION: 8 weeks  PLANNED INTERVENTIONS: Therapeutic exercises, Therapeutic activity, Neuromuscular re-education, Balance training, Gait training, Patient/Family education, Joint manipulation, Joint mobilization, Stair training, Aquatic Therapy, Dry Needling, Electrical stimulation, Spinal manipulation, Spinal mobilization, Cryotherapy, Moist heat, scar mobilization, Taping, Traction, Ultrasound, Biofeedback, Ionotophoresis '4mg'$ /ml Dexamethasone, and Manual therapy.   PLAN FOR NEXT SESSION: Progress functional LE and core strength. Increase activity tolerance, gait distance and balance as able.   4:45 PM, 04/14/22 Teena Irani, PTA/CLT [place] Ph: [phone]

## 2022-04-16 ENCOUNTER — Ambulatory Visit (HOSPITAL_COMMUNITY): Payer: 59 | Admitting: Occupational Therapy

## 2022-04-16 ENCOUNTER — Encounter (HOSPITAL_COMMUNITY): Payer: Self-pay | Admitting: Occupational Therapy

## 2022-04-16 DIAGNOSIS — R278 Other lack of coordination: Secondary | ICD-10-CM

## 2022-04-16 DIAGNOSIS — M6281 Muscle weakness (generalized): Secondary | ICD-10-CM

## 2022-04-16 DIAGNOSIS — R29898 Other symptoms and signs involving the musculoskeletal system: Secondary | ICD-10-CM

## 2022-04-16 NOTE — Therapy (Unsigned)
OUTPATIENT OCCUPATIONAL THERAPY ORTHO TREATMENT NOTE  Patient Name: Joaquim Tolen MRN: [id number] DOB:1960/06/24, 61 y.o., male Today's Date: 04/16/2022  PCP: Wende Neighbors, MD REFERRING PROVIDER: Debroah Baller, FNP  END OF SESSION:  OT End of Session - 04/16/22 1027     Visit Number 2    Number of Visits 9    Date for OT Re-Evaluation 06/04/22    Authorization Type [employer], No Co-Pay    Authorization Time Period 40 visit limit    Progress Note Due on Visit 10    OT Start Time 1030    OT Stop Time 1115    OT Time Calculation (min) 45 min    Activity Tolerance Patient tolerated treatment well    Behavior During Therapy [facility] for tasks assessed/performed             Past Medical History:  Diagnosis Date   Cancer ([place])    Port-A-Cath in place 12/28/2019   Past Surgical History:  Procedure Laterality Date   COLONOSCOPY     PORTACATH PLACEMENT Right 01/18/2020   Procedure: INSERTION PORT-A-CATH;  Surgeon: Aviva Signs, MD;  Location: [facility];  Service: General;  Laterality: Right;   Patient Active Problem List   Diagnosis Date Noted   Lobar pneumonia ([place]) 01/19/2022   Severe sepsis ([place]) 01/18/2022   Acute respiratory failure with hypoxia ([place]) 01/18/2022   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[place]) 04/07/2020   Myelodysplastic syndrome, high grade ([place]) 12/28/2019   Goals of care, counseling/discussion 12/28/2019   Port-A-Cath in place 12/28/2019   Macrocytic anemia 12/12/2019   Thrombocytopenia ([place]) 12/12/2019    ONSET DATE: 01/18/22  REFERRING DIAG: Generalized Weakness  THERAPY DIAG:  Other symptoms and signs involving the musculoskeletal system  Other lack of coordination  Muscle weakness (generalized)  Rationale for Evaluation and Treatment: Rehabilitation  SUBJECTIVE:   SUBJECTIVE STATEMENT: "I just have no strength to lift anything"  PERTINENT HISTORY: 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 Started PT in July 2023. Pt diagnosed with Pneumonia  in ED on 01/18/22. Pt was in Inpatient Rehab at the [facility] for 12 days in October 2023.   PRECAUTIONS: Fall  WEIGHT BEARING RESTRICTIONS: No  PAIN:  Are you having pain? No  FALLS: Has patient fallen in last 6 months? No  LIVING ENVIRONMENT: Lives with: lives with their spouse Lives in: House/apartment Has following equipment at home: Gilford Rile - 2 wheeled, Wheelchair (manual), shower chair, bed side commode, and Grab bars  PLOF: Independent  PATIENT GOALS: To get stronger  NEXT MD VISIT: Every Other Monday, 04/12/22  OBJECTIVE:   HAND DOMINANCE: Right  ADLs: Overall ADLs: Pt is able to complete basic ADL's, however is unable to lift anything heavier than 5lbs. Unable to complete IADL's such as cooking and cleaning. Requires frequent rest breaks.    FUNCTIONAL OUTCOME MEASURES: Quick Dash: 47.73  UPPER EXTREMITY ROM:     Active ROM Right eval Left eval  Shoulder flexion 117 110  Shoulder abduction 134 126  Shoulder internal rotation 90 90  Shoulder external rotation 60 50  (Blank rows = not tested)   UPPER EXTREMITY MMT:     MMT Right eval Left eval  Shoulder flexion 4/5 4/5  Shoulder abduction 4/5 4/5  Shoulder adduction 4+/5 4/5  Shoulder extension 4/5 4/5  Shoulder internal rotation 3/5 3/5  Shoulder external rotation 3/5 3/5  Elbow flexion 3/5 3/5  Elbow extension 3/5 3/5  Wrist flexion 4/5 3/5  Wrist extension 4/5 3/5  Wrist ulnar deviation  4+/5 3/5  Wrist radial deviation 4+/5 3/5  Wrist pronation 4+/5 3/5  Wrist supination 4/5 3/5  (Blank rows = not tested)  HAND FUNCTION: Grip strength: Right: - lbs; Left: - lbs, Lateral pinch: Right: - lbs, Left: - lbs, and 3 point pinch: Right: - lbs, Left: - lbs ** Will test next session  COORDINATION: 9 Hole Peg test: Right: - sec; Left: - sec ** will test next session  SENSATION: Pt has burning and extremely sensitive to all sensations  EDEMA: no swelling noted  OBSERVATIONS: Able to make  full fists, tremors noted in BUE   TODAY'S TREATMENT:                                                                                                                              DATE:   04/16/22: -AA/ROM: seated, flexion, abduction, protraction, horizontal abduction, er/IR, 1x10 (BUE) -Table slides: flexion and abduction, 1x10 -Strengthening: 3lb dowel, bicep curls and tricep extensions, 1x10 -A/ROM: seated, flexion, abduction, protraction, horizontal abduction, er/IR, 1x8 (BUE) -Wall Slides: flexion and abduction, 1x10   PATIENT EDUCATION: Education details: Table Slides Person educated: Patient Education method: Explanation and Demonstration Education comprehension: verbalized understanding and returned demonstration  HOME EXERCISE PROGRAM: 11/27: Table Slides  GOALS: Goals reviewed with patient? Yes  SHORT TERM GOALS: Target date: 05/07/22  Pt will be provided with and educated on HEP to improve mobility in BUE required for ADL completion. Goal status: IN PROGRESS  2.  Pt will decrease pain to average 3/10 or less in order to sleep for 3+ consecutive hours without waking due to pain.  Goal status: IN PROGRESS  3.  Pt will improve A/ROM by 15 degrees in all directions to improve ability to reach up into cabinets for meal prep. Goal status: IN PROGRESS  4.  Pt will improve strength to 4/5 to improve ability to gather clothes or items needed for dressing/bathing and hold them without dropping them due to weakness. Goal status: IN PROGRESS  LONG TERM GOALS: Target date: 06/04/21  Pt will decrease fascial restrictions to minimal amounts or less to improve mobility required for overhead reaching tasks.  Goal status: IN PROGRESS  2.  Pt will improve A/ROM to [facility] in BUE, in all directions to improve ability to reach over head and behind back during dressing and bathing tasks.  Goal status: IN PROGRESS  3.  Pt will improve strength to 4+/5 in BUE to improve ability to perform  lifting tasks required for cooking and cleaning tasks.  Goal status: IN PROGRESS  4.  Pt will increase BUE grip strength by 10# and pinch strength by 3# to improve ability to grasp and hold pots and pans during meal preparation.  Goal status: IN PROGRESS   ASSESSMENT:  CLINICAL IMPRESSION: Pt seen for his first therapy treatment session. He demonstrated limited ROM this session due to pain and muscle restriction   PLAN:  OT FREQUENCY: 1x/week  OT DURATION:  8 weeks  PLANNED INTERVENTIONS: self care/ADL training, therapeutic exercise, therapeutic activity, neuromuscular re-education, manual therapy, passive range of motion, moist heat, cryotherapy, patient/family education, and DME and/or AE instructions  RECOMMENDED OTHER SERVICES: PT  CONSULTED AND AGREED WITH PLAN OF CARE: Patient  PLAN FOR NEXT SESSION: Manual, P/ROM, AA/ROM, A/ROM, isometrics, wall slides  Paulita Fujita, OTR/L [facility] [place], [place] 04/16/2022, 10:29 AM

## 2022-04-19 ENCOUNTER — Encounter (HOSPITAL_COMMUNITY): Payer: Self-pay | Admitting: Physical Therapy

## 2022-04-19 ENCOUNTER — Ambulatory Visit (HOSPITAL_COMMUNITY): Payer: 59 | Admitting: Physical Therapy

## 2022-04-19 DIAGNOSIS — R29898 Other symptoms and signs involving the musculoskeletal system: Secondary | ICD-10-CM | POA: Diagnosis not present

## 2022-04-19 DIAGNOSIS — M6281 Muscle weakness (generalized): Secondary | ICD-10-CM

## 2022-04-19 NOTE — Therapy (Signed)
OUTPATIENT PHYSICAL THERAPY TREATMENT   Patient Name: Hunter Smith MRN: [id number] DOB:12/16/1960, 61 y.o., male Today's Date: 04/19/2022   PCP: Delphina Cahill MD REFERRING PROVIDER: Debroah Baller, NP  END OF SESSION:  PT End of Session - 04/19/22 1027     Visit Number 3    Number of Visits 16    Date for PT Re-Evaluation 05/31/22    Authorization Type [employer] ( 40 visit limit/ 17 remain as of eval)    Authorization - Visit Number 3    Authorization - Number of Visits 17    Progress Note Due on Visit 10    PT Start Time 1027    PT Stop Time 1108    PT Time Calculation (min) 41 min    Activity Tolerance Patient tolerated treatment well;Patient limited by fatigue    Behavior During Therapy [facility] for tasks assessed/performed             Past Medical History:  Diagnosis Date   Cancer ([place])    Port-A-Cath in place 12/28/2019   Past Surgical History:  Procedure Laterality Date   COLONOSCOPY     PORTACATH PLACEMENT Right 01/18/2020   Procedure: INSERTION PORT-A-CATH;  Surgeon: Aviva Signs, MD;  Location: [facility];  Service: General;  Laterality: Right;   Patient Active Problem List   Diagnosis Date Noted   Lobar pneumonia ([place]) 01/19/2022   Severe sepsis ([place]) 01/18/2022   Acute respiratory failure with hypoxia ([place]) 01/18/2022   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[place]) 04/07/2020   Myelodysplastic syndrome, high grade ([place]) 12/28/2019   Goals of care, counseling/discussion 12/28/2019   Port-A-Cath in place 12/28/2019   Macrocytic anemia 12/12/2019   Thrombocytopenia ([place]) 12/12/2019    ONSET DATE: Ongoing  REFERRING DIAG: PT eval/tx for functional mobility, txfers, safety, balance,  THERAPY DIAG:  Muscle weakness (generalized)  Rationale for Evaluation and Treatment: Rehabilitation  SUBJECTIVE:                                                                                                                                                                                              SUBJECTIVE STATEMENT: Patient reports legs are a little sore today. He did exercises yesterday with grand kids. No falls.   Pt accompanied by:  wife [name], academic)  PERTINENT HISTORY: neuropathy, HX of cancer  PAIN:  Are you having pain? Yes: NPRS scale: 3/10 Pain location: bilateral knees and LE Pain description: dull aching in knees, pressure in abdomen  Aggravating factors: WB, walking  Relieving factors: meds   PRECAUTIONS: None (no driving)  WEIGHT BEARING RESTRICTIONS: No  FALLS: Has patient fallen in last 6  months? No  LIVING ENVIRONMENT: Lives with: lives with their spouse Lives in: House/apartment Stairs: Yes: External: 14 steps; bilateral but cannot reach both Has following equipment at home: Single point cane, Walker - 2 wheeled, Wheelchair (manual), shower chair, and bed side commode  PLOF: Independent with basic ADLs  PATIENT GOALS: get stronger, walk without AD   OBJECTIVE:   DIAGNOSTIC FINDINGS: NA  COGNITION: Overall cognitive status: Within functional limits for tasks assessed   SENSATION: Reports history of peripheral neuropathy    LOWER EXTREMITY MMT:    MMT Right Eval Left Eval  Hip flexion 4- 4-  Hip extension 3- 3-  Hip abduction 3+ 3+  Hip adduction    Hip internal rotation    Hip external rotation    Knee flexion    Knee extension 3+ 3+  Ankle dorsiflexion 3+ 3+  Ankle plantarflexion    Ankle inversion    Ankle eversion    (Blank rows = not tested)  BED MOBILITY:  Sit to supine Modified independence Supine to sit Modified independence Rolling to Right Modified independence Rolling to Left Modified independence  TRANSFERS: Assistive device utilized: None  Sit to stand: Modified independence and SBA Stand to sit: Modified independence and SBA Chair to chair: Modified independence and SBA   GAIT: Decreased stride, slightly flexed trunk, using RW   FUNCTIONAL TESTS:  5 times sit to stand: 20.8 sec with UE  Timed up  and go (TUG): 19.27 sec with RW   TODAY'S TREATMENT:                                                                                                                              DATE:  04/19/22 Standing hip abduction 2 x 10 bilateral  Standing cone taps 2 x 10 with unilateral HHA  Seated trunk flexion stretch 1 x 10 5 second holds Lateral stepping 6 x 15 feet bilateral  Tandem stance 2 x 30 seconds bilateral  STS 2x 5 from 20" table  04/14/22 Seated:  sit to stand from standard chair with 2" foam no UE 10X  Review of HEP:  LAQ and marching Standing:  Hip abduction 2X10 with UE assist  Hip extension 2X10 with UE assist  Alternating march 2X10 with UE assist  4" forward step ups 2X10 each with UE assist  4" lateral step ups 2X10 each with UE assist  04/05/22  Eval     PATIENT EDUCATION: Education details: 04/19/22 HEP; EVAL:on eval findings, POC and HEP Person educated: Patient Education method: Explanation Education comprehension: verbalized understanding  HOME EXERCISE PROGRAM: Access Code: [email or web address] URL: [email or web address]  Date: 04/14/2022 Prepared by: Roseanne Reno Exercises - Standing Hip Abduction with Unilateral Counter Support  - 2 x daily - 7 x weekly - 1 sets - 10 reps - Standing Hip Extension  - 2 x daily - 7 x weekly - 1 sets - 10 reps - Standing March with Counter Support  - 2  x daily - 7 x weekly - 1 sets - 10 reps  Date: 04/05/2022 Prepared by: Josue Hector Exercises - Seated March  - 2-3 x daily - 7 x weekly - 2 sets - 10 reps - Seated Long Arc Quad  - 2-3 x daily - 7 x weekly - 2 sets - 10 reps - Sit to Stand with Counter Support  - 2-3 x daily - 7 x weekly - 2 sets - 10 reps  GOALS: SHORT TERM GOALS: Target date: 05/03/2022  Patient will be independent with initial HEP and self-management strategies to improve functional outcomes Baseline:  Goal status: IN PROGRESS   2.  Patient will improve STS to < 15 seconds to demo improved  functional strength and reduced risk for falls.  Baseline: 20.88 sec with UEs Goal status: IN PROGRESS  LONG TERM GOALS: Target date: 05/31/2022  Patient will be independent with advanced HEP and self-management strategies to improve functional outcomes Baseline:  Goal status: IN PROGRESS  2.  Patient will improve STS to < 12 seconds to demo improved functional strength and reduced risk for falls.  Baseline: 20.88 sec with UEs Goal status: IN PROGRESS  3. Patient will have equal to or > 4/5 MMT throughout BLE to improve ability to perform functional mobility, stair ambulation and ADLs.  Baseline: See MMT Goal status: IN PROGRESS  4. Patient will decreased TUG time to < 12 seconds using LRAD to demonstrate improved balance and ability to perform functional mobility and associated tasks. Baseline: 19.27 sec with RW Goal status: IN PROGRESS  ASSESSMENT:  CLINICAL IMPRESSION: Continued with LE strengthening and balance exercises. Patient with c/o increase in back pain following cone taps and began trunk flexion stretch to relax lumbar musculature. Patient with moderate sway with tandem stance requiring frequent UE support to maintain. Cueing given for breathing pattern with STS due to tendency to strain. Patient will continue to benefit from physical therapy in order to improve function and reduce impairment.    OBJECTIVE IMPAIRMENTS: Abnormal gait, decreased activity tolerance, decreased balance, decreased endurance, decreased mobility, difficulty walking, decreased strength, and pain.   ACTIVITY LIMITATIONS: carrying, lifting, bending, standing, squatting, stairs, transfers, bed mobility, and locomotion level  PARTICIPATION LIMITATIONS: cleaning, laundry, shopping, community activity, and yard work  PERSONAL FACTORS: Past/current experiences and Time since onset of injury/illness/exacerbation are also affecting patient's functional outcome.   REHAB POTENTIAL: Good  CLINICAL DECISION  MAKING: Stable/uncomplicated  EVALUATION COMPLEXITY: Low  PLAN:  PT FREQUENCY: 2x/week  PT DURATION: 8 weeks  PLANNED INTERVENTIONS: Therapeutic exercises, Therapeutic activity, Neuromuscular re-education, Balance training, Gait training, Patient/Family education, Joint manipulation, Joint mobilization, Stair training, Aquatic Therapy, Dry Needling, Electrical stimulation, Spinal manipulation, Spinal mobilization, Cryotherapy, Moist heat, scar mobilization, Taping, Traction, Ultrasound, Biofeedback, Ionotophoresis '4mg'$ /ml Dexamethasone, and Manual therapy.   PLAN FOR NEXT SESSION: Progress functional LE and core strength. Increase activity tolerance, gait distance and balance as able.   11:12 AM, 04/19/22 Mearl Latin PT, DPT Physical Therapist at [facility]

## 2022-04-21 ENCOUNTER — Ambulatory Visit (HOSPITAL_COMMUNITY): Payer: 59 | Admitting: Occupational Therapy

## 2022-04-21 ENCOUNTER — Encounter (HOSPITAL_COMMUNITY): Payer: Self-pay

## 2022-04-21 ENCOUNTER — Ambulatory Visit (HOSPITAL_COMMUNITY): Payer: 59

## 2022-04-21 DIAGNOSIS — R29898 Other symptoms and signs involving the musculoskeletal system: Secondary | ICD-10-CM

## 2022-04-21 DIAGNOSIS — M6281 Muscle weakness (generalized): Secondary | ICD-10-CM

## 2022-04-21 DIAGNOSIS — R278 Other lack of coordination: Secondary | ICD-10-CM

## 2022-04-21 NOTE — Therapy (Signed)
OUTPATIENT PHYSICAL THERAPY TREATMENT   Patient Name: Hunter Smith MRN: [id number] DOB:Jan 12, 1961, 61 y.o., male Today's Date: 04/21/2022   PCP: Delphina Cahill MD REFERRING PROVIDER: Debroah Baller, NP  END OF SESSION:  PT End of Session - 04/21/22 1301     Visit Number 4    Number of Visits 16    Date for PT Re-Evaluation 05/31/22    Authorization Type U[employer] ( 40 visit limit/ 17 remain as of eval)    Authorization - Visit Number 4    Authorization - Number of Visits 17    Progress Note Due on Visit 10    PT Start Time 1303    PT Stop Time 1342    PT Time Calculation (min) 39 min    Activity Tolerance Patient tolerated treatment well;Patient limited by fatigue    Behavior During Therapy [facility] for tasks assessed/performed             Past Medical History:  Diagnosis Date   Cancer ([place])    Port-A-Cath in place 12/28/2019   Past Surgical History:  Procedure Laterality Date   COLONOSCOPY     PORTACATH PLACEMENT Right 01/18/2020   Procedure: INSERTION PORT-A-CATH;  Surgeon: Aviva Signs, MD;  Location: [facility];  Service: General;  Laterality: Right;   Patient Active Problem List   Diagnosis Date Noted   Lobar pneumonia ([place]) 01/19/2022   Severe sepsis ([place]) 01/18/2022   Acute respiratory failure with hypoxia ([place]) 01/18/2022   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   Myelodysplastic syndrome, high grade ([place]) 12/28/2019   Goals of care, counseling/discussion 12/28/2019   Port-A-Cath in place 12/28/2019   Macrocytic anemia 12/12/2019   Thrombocytopenia ([place]) 12/12/2019    ONSET DATE: Ongoing  REFERRING DIAG: PT eval/tx for functional mobility, txfers, safety, balance,  THERAPY DIAG:  Muscle weakness (generalized)  Other symptoms and signs involving the musculoskeletal system  Rationale for Evaluation and Treatment: Rehabilitation  SUBJECTIVE:                                                                                                                                                                                              SUBJECTIVE STATEMENT: Pt reports he is sore sore today.  Knee, leg and LBP pain scale 4/10.  Reports he has been compliance with HEP regularly and completing stairs at home.    Pt accompanied by:  wife [name], academic)  PERTINENT HISTORY: neuropathy, HX of cancer  PAIN:  Are you having pain? Yes: NPRS scale: 3/10 Pain location: bilateral knees and LE Pain description: dull aching in knees, pressure in abdomen  Aggravating factors: WB, walking  Relieving factors: meds   PRECAUTIONS: None (no driving)  WEIGHT BEARING RESTRICTIONS: No  FALLS: Has patient fallen in last 6 months? No  LIVING ENVIRONMENT: Lives with: lives with their spouse Lives in: House/apartment Stairs: Yes: External: 14 steps; bilateral but cannot reach both Has following equipment at home: Single point cane, Walker - 2 wheeled, Wheelchair (manual), shower chair, and bed side commode  PLOF: Independent with basic ADLs  PATIENT GOALS: get stronger, walk without AD   OBJECTIVE:   DIAGNOSTIC FINDINGS: NA  COGNITION: Overall cognitive status: Within functional limits for tasks assessed   SENSATION: Reports history of peripheral neuropathy    LOWER EXTREMITY MMT:    MMT Right Eval Left Eval  Hip flexion 4- 4-  Hip extension 3- 3-  Hip abduction 3+ 3+  Hip adduction    Hip internal rotation    Hip external rotation    Knee flexion    Knee extension 3+ 3+  Ankle dorsiflexion 3+ 3+  Ankle plantarflexion    Ankle inversion    Ankle eversion    (Blank rows = not tested)  BED MOBILITY:  Sit to supine Modified independence Supine to sit Modified independence Rolling to Right Modified independence Rolling to Left Modified independence  TRANSFERS: Assistive device utilized: None  Sit to stand: Modified independence and SBA Stand to sit: Modified independence and SBA Chair to chair: Modified independence and  SBA   GAIT: Decreased stride, slightly flexed trunk, using RW   FUNCTIONAL TESTS:  5 times sit to stand: 20.8 sec with UE  Timed up and go (TUG): 19.27 sec with RW   TODAY'S TREATMENT:                                                                                                                              DATE:  04/21/22: Heel raise 10x March 10x with 1 HHA  Minisquat 3x, c/o Rt knee pain so DC exercise STS elevated height 21in height Standing hip abduction 2 x 10 bilateral  with 1 HHA Sidestep in hallway with HHA 2RT (~29f) Seated trunk flexion stretch 1 x 10 5 second holds Seated Wback 10x Tandem stance 2x 30" min guard/ intermittent HHA   04/19/22 Standing hip abduction 2 x 10 bilateral  Standing cone taps 2 x 10 with unilateral HHA  Seated trunk flexion stretch 1 x 10 5 second holds Lateral stepping 6 x 15 feet bilateral  Tandem stance 2 x 30 seconds bilateral  STS 2x 5 from 20" table  04/14/22 Seated:  sit to stand from standard chair with 2" foam no UE 10X  Review of HEP:  LAQ and marching Standing:  Hip abduction 2X10 with UE assist  Hip extension 2X10 with UE assist  Alternating march 2X10 with UE assist  4" forward step ups 2X10 each with UE assist  4" lateral step ups 2X10 each with UE assist  04/05/22  Eval     PATIENT EDUCATION: Education  details: 04/19/22 HEP; EVAL:on eval findings, POC and HEP Person educated: Patient Education method: Explanation Education comprehension: verbalized understanding  HOME EXERCISE PROGRAM: Access Code: [email or web address] URL: [email or web address]  Date: 04/14/2022 Prepared by: Roseanne Reno Exercises - Standing Hip Abduction with Unilateral Counter Support  - 2 x daily - 7 x weekly - 1 sets - 10 reps - Standing Hip Extension  - 2 x daily - 7 x weekly - 1 sets - 10 reps - Standing March with Counter Support  - 2 x daily - 7 x weekly - 1 sets - 10 reps  Date: 04/05/2022 Prepared by: Josue Hector Exercises - Seated March  - 2-3 x daily - 7 x weekly - 2 sets - 10 reps - Seated Long Arc Quad  - 2-3 x daily - 7 x weekly - 2 sets - 10 reps - Sit to Stand with Counter Support  - 2-3 x daily - 7 x weekly - 2 sets - 10 reps  GOALS: SHORT TERM GOALS: Target date: 05/03/2022  Patient will be independent with initial HEP and self-management strategies to improve functional outcomes Baseline:  Goal status: IN PROGRESS   2.  Patient will improve STS to < 15 seconds to demo improved functional strength and reduced risk for falls.  Baseline: 20.88 sec with UEs Goal status: IN PROGRESS  LONG TERM GOALS: Target date: 05/31/2022  Patient will be independent with advanced HEP and self-management strategies to improve functional outcomes Baseline:  Goal status: IN PROGRESS  2.  Patient will improve STS to < 12 seconds to demo improved functional strength and reduced risk for falls.  Baseline: 20.88 sec with UEs Goal status: IN PROGRESS  3. Patient will have equal to or > 4/5 MMT throughout BLE to improve ability to perform functional mobility, stair ambulation and ADLs.  Baseline: See MMT Goal status: IN PROGRESS  4. Patient will decreased TUG time to < 12 seconds using LRAD to demonstrate improved balance and ability to perform functional mobility and associated tasks. Baseline: 19.27 sec with RW Goal status: IN PROGRESS  ASSESSMENT:  CLINICAL IMPRESSION: Continued session focus with LE strengthening and balance exercises.  Attempted mini-squats for functional strengthening, multimodal cueing for proper form and mechanics, c/o increased Rt knee pain so DC'd exercise this session.  Pt required increased height with STS due to weakness and tendency to hold breath during exercise, educated importance of breathing through all exercises.  Pt with positive reports following theraball stretches last session, continued with stretches.  Educated importance of posture to assist with back pain  and balance, added postural strengthening exercises.  Pt with moderate sway with tandem stance with required min A/UE support to maintailn good balance.  No reports of increased pain through session.     OBJECTIVE IMPAIRMENTS: Abnormal gait, decreased activity tolerance, decreased balance, decreased endurance, decreased mobility, difficulty walking, decreased strength, and pain.   ACTIVITY LIMITATIONS: carrying, lifting, bending, standing, squatting, stairs, transfers, bed mobility, and locomotion level  PARTICIPATION LIMITATIONS: cleaning, laundry, shopping, community activity, and yard work  PERSONAL FACTORS: Past/current experiences and Time since onset of injury/illness/exacerbation are also affecting patient's functional outcome.   REHAB POTENTIAL: Good  CLINICAL DECISION MAKING: Stable/uncomplicated  EVALUATION COMPLEXITY: Low  PLAN:  PT FREQUENCY: 2x/week  PT DURATION: 8 weeks  PLANNED INTERVENTIONS: Therapeutic exercises, Therapeutic activity, Neuromuscular re-education, Balance training, Gait training, Patient/Family education, Joint manipulation, Joint mobilization, Stair training, Aquatic Therapy, Dry Needling, Electrical stimulation, Spinal manipulation, Spinal mobilization, Cryotherapy, Moist heat, scar mobilization, Taping,  Traction, Ultrasound, Biofeedback, Ionotophoresis '4mg'$ /ml Dexamethasone, and Manual therapy.   PLAN FOR NEXT SESSION: Progress functional LE and core strength. Increase activity tolerance, gait distance and balance as able.   Ihor Austin, LPTA/CLT; CBIS [phone] 2:05 PM, 04/21/22

## 2022-04-21 NOTE — Therapy (Signed)
OUTPATIENT OCCUPATIONAL THERAPY ORTHO TREATMENT NOTE  Patient Name: Hunter Smith MRN: [id number] DOB:[birth year], 61 y.o., male Today's Date: 04/21/2022  PCP: Wende Neighbors, MD REFERRING PROVIDER: Debroah Baller, FNP  END OF SESSION:   04/21/22 1600  OT Visits / Re-Eval  Visit Number 3  Number of Visits 9  Date for OT Re-Evaluation 06/04/22  Authorization  Authorization Type [employer], No Co-Pay  Authorization Time Period 40 visit limit  Progress Note Due on Visit 10  OT Time Calculation  OT Start Time 1400  OT Stop Time 1445  OT Time Calculation (min) 45 min  End of Session  Activity Tolerance Patient tolerated treatment well  Behavior During Therapy [facility] for tasks assessed/performed     Past Medical History:  Diagnosis Date   Cancer ([place])    Port-A-Cath in place 12/28/2019   Past Surgical History:  Procedure Laterality Date   COLONOSCOPY     PORTACATH PLACEMENT Right 01/18/2020   Procedure: INSERTION PORT-A-CATH;  Surgeon: Aviva Signs, MD;  Location: [facility];  Service: General;  Laterality: Right;   Patient Active Problem List   Diagnosis Date Noted   Lobar pneumonia ([place]) 01/19/2022   Severe sepsis ([place]) 01/18/2022   Acute respiratory failure with hypoxia ([place]) 01/18/2022   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 Started PT in July 2023. Pt diagnosed with Pneumonia in ED on 01/18/22. Pt was in Inpatient Rehab at the [facility] for 12 days in  October 2023.   PRECAUTIONS: Fall  WEIGHT BEARING RESTRICTIONS: No  PAIN:  Are you having pain? No  FALLS: Has patient fallen in last 6 months? No  LIVING ENVIRONMENT: Lives with: lives with their spouse Lives in: House/apartment Has following equipment at home: Gilford Rile - 2 wheeled, Wheelchair (manual), shower chair, bed side commode, and Grab bars  PLOF: Independent  PATIENT GOALS: To get stronger  NEXT MD VISIT: Every Other Monday, 04/12/22  OBJECTIVE:   HAND DOMINANCE: Right  ADLs: Overall ADLs: Pt is able to complete basic ADL's, however is unable to lift anything heavier than 5lbs. Unable to complete IADL's such as cooking and cleaning. Requires frequent rest breaks.    FUNCTIONAL OUTCOME MEASURES: Quick Dash: 47.73  UPPER EXTREMITY ROM:     Active ROM Right eval Left eval  Shoulder flexion 117 110  Shoulder abduction 134 126  Shoulder internal rotation 90 90  Shoulder external rotation 60 50  (Blank rows = not tested)   UPPER EXTREMITY MMT:     MMT Right eval Left eval  Shoulder flexion 4/5 4/5  Shoulder abduction 4/5 4/5  Shoulder adduction 4+/5 4/5  Shoulder extension 4/5 4/5  Shoulder internal rotation 3/5 3/5  Shoulder external rotation 3/5 3/5  Elbow flexion 3/5 3/5  Elbow extension 3/5 3/5  Wrist flexion 4/5 3/5  Wrist extension 4/5 3/5  Wrist ulnar deviation 4+/5 3/5  Wrist radial deviation 4+/5 3/5  Wrist pronation 4+/5 3/5  Wrist supination 4/5 3/5  (Blank rows = not tested)  HAND FUNCTION: Grip strength: Right: - lbs; Left: -  lbs, Lateral pinch: Right: - lbs, Left: - lbs, and 3 point pinch: Right: - lbs, Left: - lbs   COORDINATION: 9 Hole Peg test: Right: - sec; Left: - sec   SENSATION: Pt has burning and extremely sensitive to all sensations  EDEMA: no swelling noted  OBSERVATIONS: Able to make full fists, tremors noted in BUE   TODAY'S TREATMENT:                                                                                                                               DATE:   04/21/22: -AA/ROM: seated, 3lb and 5lb dowel, flexion, abduction, protraction, horizontal abduction, er/IR, 1x10 (BUE) -A/ROM: 1lb wrist weights, flexion, abduction, protraction, horizontal abduction, er/IR, 1x10 (BUE) -Functional reaching: standing at mirror, reaching above his head placing squigs on the mirror, x14 on both sides -Table Slides: flexion and abduction, BUE, 1x10 -Scapular ROM: extension, retraction, rows, 1x10 -Strengthening: 5lb dowel, bicep curls and tricep extensions, 1x10  04/16/22: -AA/ROM: seated, flexion, abduction, protraction, horizontal abduction, er/IR, 1x10 (BUE) -Table slides: flexion and abduction, 1x10 -Strengthening: 3lb dowel, bicep curls and tricep extensions, 1x10 -A/ROM: seated, flexion, abduction, protraction, horizontal abduction, er/IR, 1x8 (BUE) -Wall Slides: flexion and abduction, 1x10   PATIENT EDUCATION: Education details: Scapular ROM Person educated: Patient Education method: Explanation and Demonstration Education comprehension: verbalized understanding and returned demonstration  HOME EXERCISE PROGRAM: 11/27: Table Slides 12/8: AA/ROM  GOALS: Goals reviewed with patient? Yes  SHORT TERM GOALS: Target date: 05/07/22  Pt will be provided with and educated on HEP to improve mobility in BUE required for ADL completion. Goal status: IN PROGRESS  2.  Pt will decrease pain to average 3/10 or less in order to sleep for 3+ consecutive hours without waking due to pain.  Goal status: IN PROGRESS  3.  Pt will improve A/ROM by 15 degrees in all directions to improve ability to reach up into cabinets for meal prep. Goal status: IN PROGRESS  4.  Pt will improve strength to 4/5 to improve ability to gather clothes or items needed for dressing/bathing and hold them without dropping them due to weakness. Goal status: IN PROGRESS  LONG TERM GOALS: Target date: 06/04/21  Pt will decrease  fascial restrictions to minimal amounts or less to improve mobility required for overhead reaching tasks.  Goal status: IN PROGRESS  2.  Pt will improve A/ROM to [facility] in BUE, in all directions to improve ability to reach over head and behind back during dressing and bathing tasks.  Goal status: IN PROGRESS  3.  Pt will improve strength to 4+/5 in BUE to improve ability to perform lifting tasks required for cooking and cleaning tasks.  Goal status: IN PROGRESS  4.  Pt will increase BUE grip strength by 10# and pinch strength by 3# to improve ability to grasp and hold pots and pans during meal preparation.  Goal status: IN PROGRESS   ASSESSMENT:  CLINICAL IMPRESSION: Pt demonstrating  good movement patterns this session. He continues to have limited ROM towards the end ranges for his shoulder, which improves with supportive ROM exercises. Therapist added scapular ROM this session to work on technique before adding resistance for low level strengthening next session. Pt requiring min-mod verbal cuing for technique and positioning during most exercises this session.      PLAN:  OT FREQUENCY: 1x/week  OT DURATION: 8 weeks  PLANNED INTERVENTIONS: self care/ADL training, therapeutic exercise, therapeutic activity, neuromuscular re-education, manual therapy, passive range of motion, moist heat, cryotherapy, patient/family education, and DME and/or AE instructions  RECOMMENDED OTHER SERVICES: PT  CONSULTED AND AGREED WITH PLAN OF CARE: Patient  PLAN FOR NEXT SESSION: Manual, P/ROM, AA/ROM, A/ROM, isometrics, wall slides, start scapula strengthening, proximal shoulder exercises    Paulita Fujita, OTR/L [facility] [place], [place] 04/21/2022, 2:05 PM

## 2022-04-25 ENCOUNTER — Encounter (HOSPITAL_COMMUNITY): Payer: Self-pay | Admitting: Occupational Therapy

## 2022-04-27 ENCOUNTER — Ambulatory Visit (HOSPITAL_COMMUNITY): Payer: 59

## 2022-04-27 ENCOUNTER — Encounter (HOSPITAL_COMMUNITY): Payer: Self-pay

## 2022-04-27 DIAGNOSIS — M6281 Muscle weakness (generalized): Secondary | ICD-10-CM

## 2022-04-27 DIAGNOSIS — R29898 Other symptoms and signs involving the musculoskeletal system: Secondary | ICD-10-CM

## 2022-04-27 NOTE — Therapy (Addendum)
OUTPATIENT PHYSICAL THERAPY TREATMENT   Patient Name: Hunter Smith MRN: [id number] DOB:05/27/1960, 61 y.o., male Today's Date: 04/27/2022   PCP: Delphina Cahill MD REFERRING PROVIDER: Debroah Baller, NP  END OF SESSION:   04/27/22 1407  PT Visits / Re-Eval  Visit Number 5  Number of Visits 16  Date for PT Re-Evaluation 05/31/22  Authorization  Authorization Type [employer] ( 78 visit limit/ 52 remain as of eval)  Authorization - Visit Number 5  Authorization - Number of Visits 17  Progress Note Due on Visit 10  PT Time Calculation  PT Start Time 1350  PT Stop Time 1428  PT Time Calculation (min) 38 min  PT - End of Session  Activity Tolerance Patient tolerated treatment well;Patient limited by fatigue  Behavior During Therapy [facility] for tasks assessed/performed     Past Medical History:  Diagnosis Date   Cancer ([place])    Port-A-Cath in place 12/28/2019   Past Surgical History:  Procedure Laterality Date   COLONOSCOPY     PORTACATH PLACEMENT Right 01/18/2020   Procedure: INSERTION PORT-A-CATH;  Surgeon: Aviva Signs, MD;  Location: [facility];  Service: General;  Laterality: Right;   Patient Active Problem List   Diagnosis Date Noted   Lobar pneumonia ([place]) 01/19/2022   Severe sepsis ([place]) 01/18/2022   Acute respiratory failure with hypoxia ([place]) 01/18/2022   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  Rationale for Evaluation and Treatment: Rehabilitation  SUBJECTIVE:                                                                                                                                                                                              SUBJECTIVE STATEMENT: Saw oncologist yesterday.  Reports he is sore today.  Continues to have pain in back, knees and legs.  Pain scale 4/10 today.  Reports he walked to granddaughter's birthday party with a cane, stated his balance was more difficult stated he sat in chair for majority of party.    PERTINENT HISTORY: neuropathy, HX of cancer  PAIN:  Are you having pain? Yes: NPRS scale: 3/10 Pain location: bilateral knees and LE Pain description: dull aching in knees, pressure in abdomen  Aggravating factors: WB, walking  Relieving factors: meds   PRECAUTIONS: None (no driving)  WEIGHT BEARING RESTRICTIONS: No  FALLS: Has patient fallen in last 6 months? No  LIVING ENVIRONMENT: Lives with: lives with their spouse Lives in: House/apartment Stairs: Yes: External: 14 steps; bilateral but cannot reach both Has following equipment at home: Single point cane, Walker - 2 wheeled, Wheelchair (manual), shower chair, and bed side commode  PLOF: Independent with basic ADLs  PATIENT GOALS: get stronger, walk without AD   OBJECTIVE:   DIAGNOSTIC FINDINGS: NA  COGNITION: Overall cognitive status: Within functional limits for tasks assessed   SENSATION: Reports history of peripheral neuropathy    LOWER EXTREMITY MMT:    MMT Right Eval Left Eval  Hip flexion 4- 4-  Hip extension 3- 3-  Hip abduction 3+ 3+  Hip adduction    Hip internal rotation    Hip external rotation    Knee flexion    Knee extension 3+ 3+  Ankle dorsiflexion 3+ 3+  Ankle plantarflexion    Ankle inversion    Ankle eversion    (Blank rows = not tested)  BED MOBILITY:  Sit to supine Modified independence Supine to sit Modified independence Rolling to Right Modified independence Rolling to Left Modified independence  TRANSFERS: Assistive device utilized: None  Sit to stand: Modified independence and SBA Stand to sit: Modified independence and SBA Chair to chair:  Modified independence and SBA   GAIT: Decreased stride, slightly flexed trunk, using RW   FUNCTIONAL TESTS:  5 times sit to stand: 20.8 sec with UE  Timed up and go (TUG): 19.27 sec with RW   TODAY'S TREATMENT:                                                                                                                              DATE:  04/27/22: Nustep: 37mn UE/LE goal SPM >60 Sidelying Abd 15x Supine: bridge STS no HHA 20in height 10x Heel raise 10x  March 10x with 1 HHA  Sidestep 2RT with HHA inside bars     04/21/22: Heel raise 10x March 10x with 1 HHA  Minisquat 3x, c/o Rt knee pain so DC exercise STS elevated height 21in height Standing hip abduction 2 x 10 bilateral  with 1 HHA Sidestep in hallway with HHA 2RT (~110f Seated trunk flexion stretch 1 x 10 5 second holds Seated Wback 10x Tandem stance 2x 30" min guard/ intermittent HHA   04/19/22 Standing hip abduction 2 x 10 bilateral  Standing cone taps 2 x 10 with unilateral HHA  Seated trunk flexion stretch 1 x 10 5 second holds Lateral stepping 6 x 15 feet bilateral  Tandem stance 2 x 30 seconds bilateral  STS 2x 5 from 20" table  04/14/22 Seated:  sit to stand from standard chair with 2" foam no UE 10X  Review of HEP:  LAQ and marching Standing:  Hip abduction 2X10 with UE assist  Hip extension 2X10 with UE assist  Alternating march 2X10 with UE assist  4" forward step ups 2X10 each with  UE assist  4" lateral step ups 2X10 each with UE assist  04/05/22  Eval     PATIENT EDUCATION: Education details: 04/19/22 HEP; EVAL:on eval findings, POC and HEP Person educated: Patient Education method: Explanation Education comprehension: verbalized understanding  HOME EXERCISE PROGRAM: Access Code: [email or web address] URL: [email or web address]  Date: 04/14/2022 Prepared by: Roseanne Reno Exercises - Standing Hip Abduction with Unilateral Counter Support  - 2 x daily - 7 x weekly - 1 sets - 10  reps - Standing Hip Extension  - 2 x daily - 7 x weekly - 1 sets - 10 reps - Standing March with Counter Support  - 2 x daily - 7 x weekly - 1 sets - 10 reps  Date: 04/05/2022 Prepared by: Josue Hector Exercises - Seated March  - 2-3 x daily - 7 x weekly - 2 sets - 10 reps - Seated Long Arc Quad  - 2-3 x daily - 7 x weekly - 2 sets - 10 reps - Sit to Stand with Counter Support  - 2-3 x daily - 7 x weekly - 2 sets - 10 reps  GOALS: SHORT TERM GOALS: Target date: 05/03/2022  Patient will be independent with initial HEP and self-management strategies to improve functional outcomes Baseline:  Goal status: IN PROGRESS   2.  Patient will improve STS to < 15 seconds to demo improved functional strength and reduced risk for falls.  Baseline: 20.88 sec with UEs Goal status: IN PROGRESS  LONG TERM GOALS: Target date: 05/31/2022  Patient will be independent with advanced HEP and self-management strategies to improve functional outcomes Baseline:  Goal status: IN PROGRESS  2.  Patient will improve STS to < 12 seconds to demo improved functional strength and reduced risk for falls.  Baseline: 20.88 sec with UEs Goal status: IN PROGRESS  3. Patient will have equal to or > 4/5 MMT throughout BLE to improve ability to perform functional mobility, stair ambulation and ADLs.  Baseline: See MMT Goal status: IN PROGRESS  4. Patient will decreased TUG time to < 12 seconds using LRAD to demonstrate improved balance and ability to perform functional mobility and associated tasks. Baseline: 19.27 sec with RW Goal status: IN PROGRESS  ASSESSMENT:  CLINICAL IMPRESSION: Began session on Nustep to address activity tolerance, pt able to keep SPM greater than 60.  Added gravity resistant exercises for hip strengthening, added to HEP.  Pt presents with increased ease with STS from lower surfaces, continues to require cueing to exhale with exertion as tendency to hold breath.  Balance activities complete  with 1 HHA for stability, Rt knee buckled during sidestep, pt able to regain balance I with UE support.  No reports of increased pain, was limited by fatigue.  Encouraged to continue ambulating with RW for safety.     OBJECTIVE IMPAIRMENTS: Abnormal gait, decreased activity tolerance, decreased balance, decreased endurance, decreased mobility, difficulty walking, decreased strength, and pain.   ACTIVITY LIMITATIONS: carrying, lifting, bending, standing, squatting, stairs, transfers, bed mobility, and locomotion level  PARTICIPATION LIMITATIONS: cleaning, laundry, shopping, community activity, and yard work  PERSONAL FACTORS: Past/current experiences and Time since onset of injury/illness/exacerbation are also affecting patient's functional outcome.   REHAB POTENTIAL: Good  CLINICAL DECISION MAKING: Stable/uncomplicated  EVALUATION COMPLEXITY: Low  PLAN:  PT FREQUENCY: 2x/week  PT DURATION: 8 weeks  PLANNED INTERVENTIONS: Therapeutic exercises, Therapeutic activity, Neuromuscular re-education, Balance training, Gait training, Patient/Family education, Joint manipulation, Joint mobilization, Stair training, Aquatic Therapy, Dry Needling, Electrical stimulation, Spinal manipulation, Spinal mobilization,  Cryotherapy, Moist heat, scar mobilization, Taping, Traction, Ultrasound, Biofeedback, Ionotophoresis '4mg'$ /ml Dexamethasone, and Manual therapy.   PLAN FOR NEXT SESSION: Progress functional LE and core strength. Increase activity tolerance, gait distance and balance as able.   Ihor Austin, LPTA/CLT; CBIS [phone] 1:50 PM, 04/27/22

## 2022-04-28 ENCOUNTER — Ambulatory Visit (HOSPITAL_COMMUNITY): Payer: 59 | Admitting: Occupational Therapy

## 2022-04-28 ENCOUNTER — Encounter (HOSPITAL_COMMUNITY): Payer: Self-pay | Admitting: Occupational Therapy

## 2022-04-28 DIAGNOSIS — R278 Other lack of coordination: Secondary | ICD-10-CM

## 2022-04-28 DIAGNOSIS — M6281 Muscle weakness (generalized): Secondary | ICD-10-CM

## 2022-04-28 DIAGNOSIS — R29898 Other symptoms and signs involving the musculoskeletal system: Secondary | ICD-10-CM

## 2022-04-28 NOTE — Therapy (Signed)
OUTPATIENT OCCUPATIONAL THERAPY ORTHO TREATMENT NOTE  Patient Name: Hunter Smith MRN: [id number] DOB:Apr 10, 1961, 61 y.o., male Today's Date: 04/28/2022  PCP: Wende Neighbors, MD REFERRING PROVIDER: Debroah Baller, FNP  END OF SESSION:  OT End of Session - 04/28/22 1401     Visit Number 4    Number of Visits 9    Date for OT Re-Evaluation 06/04/22    Authorization Type [employer], No Co-Pay    Authorization Time Period 40 visit limit    Progress Note Due on Visit 10    OT Start Time 1355    OT Stop Time 1435    OT Time Calculation (min) 40 min    Activity Tolerance Patient tolerated treatment well    Behavior During Therapy [facility] for tasks assessed/performed             Past Medical History:  Diagnosis Date   Cancer ([place])    Port-A-Cath in place 12/28/2019   Past Surgical History:  Procedure Laterality Date   COLONOSCOPY     PORTACATH PLACEMENT Right 01/18/2020   Procedure: INSERTION PORT-A-CATH;  Surgeon: Aviva Signs, MD;  Location: [facility];  Service: General;  Laterality: Right;   Patient Active Problem List   Diagnosis Date Noted   Lobar pneumonia ([place]) 01/19/2022   Severe sepsis ([place]) 01/18/2022   Acute respiratory failure with hypoxia ([place]) 01/18/2022   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[place]) 04/07/2020   Myelodysplastic syndrome, high grade ([place]) 12/28/2019   Goals of care, counseling/discussion 12/28/2019   Port-A-Cath in place 12/28/2019   Macrocytic anemia 12/12/2019   Thrombocytopenia ([place]) 12/12/2019    ONSET DATE: 01/18/22  REFERRING DIAG: Generalized Weakness  THERAPY DIAG:  Muscle weakness (generalized)  Other symptoms and signs involving the musculoskeletal system  Other lack of coordination  Rationale for Evaluation and Treatment: Rehabilitation  SUBJECTIVE:   SUBJECTIVE STATEMENT: "My neck is really bothering me today"  PERTINENT HISTORY: 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 Started PT in July 2023. Pt diagnosed with Pneumonia in  ED on 01/18/22. Pt was in Inpatient Rehab at the [facility] for 12 days in October 2023.   PRECAUTIONS: Fall  WEIGHT BEARING RESTRICTIONS: No  PAIN:  Are you having pain? No  FALLS: Has patient fallen in last 6 months? No  LIVING ENVIRONMENT: Lives with: lives with their spouse Lives in: House/apartment Has following equipment at home: Gilford Rile - 2 wheeled, Wheelchair (manual), shower chair, bed side commode, and Grab bars  PLOF: Independent  PATIENT GOALS: To get stronger  NEXT MD VISIT: Every Other Monday, 04/12/22  OBJECTIVE:   HAND DOMINANCE: Right  ADLs: Overall ADLs: Pt is able to complete basic ADL's, however is unable to lift anything heavier than 5lbs. Unable to complete IADL's such as cooking and cleaning. Requires frequent rest breaks.    FUNCTIONAL OUTCOME MEASURES: Quick Dash: 47.73  UPPER EXTREMITY ROM:     Active ROM Right eval Left eval  Shoulder flexion 117 110  Shoulder abduction 134 126  Shoulder internal rotation 90 90  Shoulder external rotation 60 50  (Blank rows = not tested)   UPPER EXTREMITY MMT:     MMT Right eval Left eval  Shoulder flexion 4/5 4/5  Shoulder abduction 4/5 4/5  Shoulder adduction 4+/5 4/5  Shoulder extension 4/5 4/5  Shoulder internal rotation 3/5 3/5  Shoulder external rotation 3/5 3/5  Elbow flexion 3/5 3/5  Elbow extension 3/5 3/5  Wrist flexion 4/5 3/5  Wrist extension 4/5 3/5  Wrist ulnar deviation 4+/5  3/5  Wrist radial deviation 4+/5 3/5  Wrist pronation 4+/5 3/5  Wrist supination 4/5 3/5  (Blank rows = not tested)  HAND FUNCTION: Grip strength: Right: - lbs; Left: - lbs, Lateral pinch: Right: - lbs, Left: - lbs, and 3 point pinch: Right: - lbs, Left: - lbs   COORDINATION: 9 Hole Peg test: Right: - sec; Left: - sec   SENSATION: Pt has burning and extremely sensitive to all sensations  EDEMA: no swelling noted  OBSERVATIONS: Able to make full fists, tremors noted in BUE   TODAY'S  TREATMENT:                                                                                                                              DATE:   04/28/22: -Neck Stretches: lateral tilts, head rotation, chin tucks, x10 each -Digiflex: 3lbs, BUE, full squeeze x10, single digit squeezes x5 each -Gripper: 23# x10, 29# x8, L 29# stacking 6 cubes, R 23# stacking  -Theraputty: roll into a ball, flatten into a pancake, roll into a log, tripod pinches, roll into a log, tip pinch, roll into ball, squeeze x10 -Scapula Strengthening: red theraband, extension, retraction, rows, 1x10, standing  04/21/22: -AA/ROM: seated, 3lb and 5lb dowel, flexion, abduction, protraction, horizontal abduction, er/IR, 1x10 (BUE) -A/ROM: 1lb wrist weights, flexion, abduction, protraction, horizontal abduction, er/IR, 1x10 (BUE) -Functional reaching: standing at mirror, reaching above his head placing squigs on the mirror, x14 on both sides -Table Slides: flexion and abduction, BUE, 1x10 -Scapular ROM: extension, retraction, rows, 1x10 -Strengthening: 5lb dowel, bicep curls and tricep extensions, 1x10  04/16/22: -AA/ROM: seated, flexion, abduction, protraction, horizontal abduction, er/IR, 1x10 (BUE) -Table slides: flexion and abduction, 1x10 -Strengthening: 3lb dowel, bicep curls and tricep extensions, 1x10 -A/ROM: seated, flexion, abduction, protraction, horizontal abduction, er/IR, 1x8 (BUE) -Wall Slides: flexion and abduction, 1x10   PATIENT EDUCATION: Education details: Theraputty exercises Person educated: Patient Education method: Explanation and Demonstration Education comprehension: verbalized understanding and returned demonstration  HOME EXERCISE PROGRAM: 11/27: Table Slides 12/8: AA/ROM 12/20: Theraputty exercises  GOALS: Goals reviewed with patient? Yes  SHORT TERM GOALS: Target date: 05/07/22  Pt will be provided with and educated on HEP to improve mobility in BUE required for ADL  completion. Goal status: IN PROGRESS  2.  Pt will decrease pain to average 3/10 or less in order to sleep for 3+ consecutive hours without waking due to pain.  Goal status: IN PROGRESS  3.  Pt will improve A/ROM by 15 degrees in all directions to improve ability to reach up into cabinets for meal prep. Goal status: IN PROGRESS  4.  Pt will improve strength to 4/5 to improve ability to gather clothes or items needed for dressing/bathing and hold them without dropping them due to weakness. Goal status: IN PROGRESS  LONG TERM GOALS: Target date: 06/04/21  Pt will decrease fascial restrictions to minimal amounts or less to improve mobility required for overhead reaching tasks.  Goal  status: IN PROGRESS  2.  Pt will improve A/ROM to [facility] in BUE, in all directions to improve ability to reach over head and behind back during dressing and bathing tasks.  Goal status: IN PROGRESS  3.  Pt will improve strength to 4+/5 in BUE to improve ability to perform lifting tasks required for cooking and cleaning tasks.  Goal status: IN PROGRESS  4.  Pt will increase BUE grip strength by 10# and pinch strength by 3# to improve ability to grasp and hold pots and pans during meal preparation.  Goal status: IN PROGRESS   ASSESSMENT:  CLINICAL IMPRESSION: Pt reporting severe neck pain this session, along with soreness all over. This session he worked on stretches for his neck and OT focused session on his grip and coordination, to decrease strain and muscle tension on his shoulders and neck. He demonstrates full grip and pinch movements, however weakness and fatigue with strengthening, especially noted with theraputty exercises. He required frequent rest breaks this session and increased time. Therapist providing verbal cuing for positioning and technique.    PLAN:  OT FREQUENCY: 1x/week  OT DURATION: 8 weeks  PLANNED INTERVENTIONS: self care/ADL training, therapeutic exercise, therapeutic activity,  neuromuscular re-education, manual therapy, passive range of motion, moist heat, cryotherapy, patient/family education, and DME and/or AE instructions  RECOMMENDED OTHER SERVICES: PT  CONSULTED AND AGREED WITH PLAN OF CARE: Patient  PLAN FOR NEXT SESSION: Manual, P/ROM, AA/ROM, A/ROM, isometrics, wall slides, scapula strengthening, proximal shoulder exercises, grip strengthening    Paulita Fujita, OTR/L [facility] [place], [place] 04/28/2022, 2:03 PM

## 2022-04-28 NOTE — Patient Instructions (Signed)
Home Exercises Program Theraputty Exercises  Do the following exercises 2-3 times a day using your affected hand.  1. Roll putty into a ball.  2. Make into a pancake.  3. Roll putty into a roll.  4. Pinch along log with first finger and thumb.   5. Make into a ball.  6. Roll it back into a log.   7. Pinch using thumb and side of first finger.  8. Roll into a ball, then flatten into a pancake.  9. Using your fingers, make putty into a mountain.  10. Roll putty back into a ball and squeeze gently for 2-3 minutes.   

## 2022-04-29 ENCOUNTER — Encounter (HOSPITAL_COMMUNITY): Payer: 59 | Admitting: Physical Therapy

## 2022-04-30 ENCOUNTER — Encounter (HOSPITAL_COMMUNITY): Payer: 59 | Admitting: Occupational Therapy

## 2022-05-04 ENCOUNTER — Encounter (HOSPITAL_COMMUNITY): Payer: 59

## 2022-05-04 ENCOUNTER — Encounter (HOSPITAL_COMMUNITY): Payer: 59 | Admitting: Occupational Therapy

## 2022-05-06 ENCOUNTER — Encounter (HOSPITAL_COMMUNITY): Payer: 59 | Admitting: Occupational Therapy

## 2022-05-06 ENCOUNTER — Encounter (HOSPITAL_COMMUNITY): Payer: 59 | Admitting: Physical Therapy

## 2022-05-11 ENCOUNTER — Encounter (HOSPITAL_COMMUNITY): Payer: 59

## 2022-05-11 ENCOUNTER — Encounter (HOSPITAL_COMMUNITY): Payer: 59 | Admitting: Occupational Therapy

## 2022-05-13 ENCOUNTER — Encounter (HOSPITAL_COMMUNITY): Payer: 59 | Admitting: Occupational Therapy

## 2022-05-13 ENCOUNTER — Encounter (HOSPITAL_COMMUNITY): Payer: 59

## 2022-05-18 ENCOUNTER — Ambulatory Visit (HOSPITAL_COMMUNITY): Payer: 59 | Admitting: Physical Therapy

## 2022-05-18 ENCOUNTER — Encounter (HOSPITAL_COMMUNITY): Payer: Self-pay

## 2022-05-18 DIAGNOSIS — M6281 Muscle weakness (generalized): Secondary | ICD-10-CM | POA: Insufficient documentation

## 2022-05-18 DIAGNOSIS — R29898 Other symptoms and signs involving the musculoskeletal system: Secondary | ICD-10-CM | POA: Insufficient documentation

## 2022-05-18 DIAGNOSIS — R278 Other lack of coordination: Secondary | ICD-10-CM | POA: Insufficient documentation

## 2022-05-19 NOTE — Therapy (Signed)
[facility] [address] [place], [place], [address] Phone: [phone]   Fax:  [phone]  Patient Details  Name: Hunter Smith MRN: [id number] Date of Birth: Jul 24, 1960 Referring Provider:  Mylo Red*  Encounter Date: 05/18/2022  Patient arrived and was not feeling well and left, patient not seen by PT.   3:15 PM, 05/19/22 Mearl Latin PT, DPT Physical Therapist at [place], [place], [address] Phone: [phone]   Fax:  [phone]

## 2022-05-20 ENCOUNTER — Encounter (HOSPITAL_COMMUNITY): Payer: 59 | Admitting: Physical Therapy

## 2022-05-24 ENCOUNTER — Ambulatory Visit (HOSPITAL_COMMUNITY): Payer: 59 | Attending: Nurse Practitioner | Admitting: Physical Therapy

## 2022-05-24 ENCOUNTER — Encounter (HOSPITAL_COMMUNITY): Payer: Self-pay | Admitting: Physical Therapy

## 2022-05-24 DIAGNOSIS — R278 Other lack of coordination: Secondary | ICD-10-CM

## 2022-05-24 DIAGNOSIS — R29898 Other symptoms and signs involving the musculoskeletal system: Secondary | ICD-10-CM | POA: Diagnosis present

## 2022-05-24 DIAGNOSIS — M6281 Muscle weakness (generalized): Secondary | ICD-10-CM

## 2022-05-24 NOTE — Therapy (Signed)
OUTPATIENT PHYSICAL THERAPY TREATMENT   Patient Name: Hunter Smith MRN: [id number] DOB:1961/02/23, 62 y.o., male Today's Date: 05/24/2022   PCP: Delphina Cahill MD REFERRING PROVIDER: Debroah Baller, NP  END OF SESSION:  PT End of Session - 05/24/22 1254     Visit Number 6    Number of Visits 16    Date for PT Re-Evaluation 05/31/22    Authorization Type [employer] ( 40 visit limit/ 17 remain as of eval)    Authorization - Visit Number 6    Authorization - Number of Visits 17    Progress Note Due on Visit 10    PT Start Time 1300    PT Stop Time 1340    PT Time Calculation (min) 40 min    Activity Tolerance Patient tolerated treatment well;Patient limited by fatigue    Behavior During Therapy [facility] for tasks assessed/performed               Past Medical History:  Diagnosis Date   Cancer ([place])    Port-A-Cath in place 12/28/2019   Past Surgical History:  Procedure Laterality Date   COLONOSCOPY     PORTACATH PLACEMENT Right 01/18/2020   Procedure: INSERTION PORT-A-CATH;  Surgeon: Aviva Signs, MD;  Location: [facility];  Service: General;  Laterality: Right;   Patient Active Problem List   Diagnosis Date Noted   Lobar pneumonia ([place]) 01/19/2022   Severe sepsis ([place]) 01/18/2022   Acute respiratory failure with hypoxia ([place]) 01/18/2022   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   Myelodysplastic syndrome, high grade ([place]) 12/28/2019   Goals of care, counseling/discussion 12/28/2019   Port-A-Cath in place 12/28/2019   Macrocytic anemia 12/12/2019   Thrombocytopenia ([place]) 12/12/2019    ONSET DATE: Ongoing  REFERRING DIAG: PT eval/tx for functional mobility, txfers, safety, balance,  THERAPY DIAG:  Muscle weakness (generalized)  Other symptoms and signs involving the musculoskeletal system  Other lack of coordination  Rationale for Evaluation and Treatment: Rehabilitation  SUBJECTIVE:                                                                                                                                                                                              SUBJECTIVE STATEMENT: Patient states back and hip pain from recent fall. He is having numbness in L arm and leg. Some L neck pain.    PERTINENT HISTORY: neuropathy, HX of cancer  PAIN:  Are you having pain? Yes: NPRS scale: 3/10 Pain location: bilateral knees and LE Pain description: dull aching in knees, pressure in abdomen  Aggravating factors: WB, walking  Relieving factors: meds   PRECAUTIONS: None (  no driving)  WEIGHT BEARING RESTRICTIONS: No  FALLS: Has patient fallen in last 6 months? No  LIVING ENVIRONMENT: Lives with: lives with their spouse Lives in: House/apartment Stairs: Yes: External: 14 steps; bilateral but cannot reach both Has following equipment at home: Single point cane, Walker - 2 wheeled, Wheelchair (manual), shower chair, and bed side commode  PLOF: Independent with basic ADLs  PATIENT GOALS: get stronger, walk without AD   OBJECTIVE:   DIAGNOSTIC FINDINGS: NA  COGNITION: Overall cognitive status: Within functional limits for tasks assessed   SENSATION: Reports history of peripheral neuropathy    LOWER EXTREMITY MMT:    MMT Right Eval Left Eval  Hip flexion 4- 4-  Hip extension 3- 3-  Hip abduction 3+ 3+  Hip adduction    Hip internal rotation    Hip external rotation    Knee flexion    Knee extension 3+ 3+  Ankle dorsiflexion 3+ 3+  Ankle plantarflexion    Ankle inversion    Ankle eversion    (Blank rows = not tested)  BED MOBILITY:  Sit to supine Modified independence Supine to sit Modified independence Rolling to Right Modified independence Rolling to Left Modified independence  TRANSFERS: Assistive device utilized: None  Sit to stand: Modified independence and SBA Stand to sit: Modified independence and SBA Chair to chair: Modified independence and SBA   GAIT: Decreased stride, slightly flexed trunk, using RW    FUNCTIONAL TESTS:  5 times sit to stand: 20.8 sec with UE  Timed up and go (TUG): 19.27 sec with RW   TODAY'S TREATMENT:                                                                                                                              DATE:  05/24/22 Nustep: 43mn UE/LE goal SPM >60 level 2 HR 1x 15 Standing march 2x 10 bilateral  Step up 6 inch 2 x 10 bilateral  Standing row GTB 2 x 10  Standing shoulder extension GTB 2 x 10  STS   04/27/22: Nustep: 450m UE/LE goal SPM >60 Sidelying Abd 15x Supine: bridge STS no HHA 20in height 10x Heel raise 10x  March 10x with 1 HHA  Sidestep 2RT with HHA inside bars     04/21/22: Heel raise 10x March 10x with 1 HHA  Minisquat 3x, c/o Rt knee pain so DC exercise STS elevated height 21in height Standing hip abduction 2 x 10 bilateral  with 1 HHA Sidestep in hallway with HHA 2RT (~1260fSeated trunk flexion stretch 1 x 10 5 second holds Seated Wback 10x Tandem stance 2x 30" min guard/ intermittent HHA   04/19/22 Standing hip abduction 2 x 10 bilateral  Standing cone taps 2 x 10 with unilateral HHA  Seated trunk flexion stretch 1 x 10 5 second holds Lateral stepping 6 x 15 feet bilateral  Tandem stance 2 x 30 seconds bilateral  STS 2x 5 from 20" table  04/14/22 Seated:  sit to stand from standard chair with 2" foam no UE 10X  Review of HEP:  LAQ and marching Standing:  Hip abduction 2X10 with UE assist  Hip extension 2X10 with UE assist  Alternating march 2X10 with UE assist  4" forward step ups 2X10 each with UE assist  4" lateral step ups 2X10 each with UE assist  04/05/22  Eval     PATIENT EDUCATION: Education details: 04/19/22 HEP; EVAL:on eval findings, POC and HEP Person educated: Patient Education method: Explanation Education comprehension: verbalized understanding  HOME EXERCISE PROGRAM: Access Code: [email or web address] URL: [email or web address]  Date: 04/14/2022 Prepared by: Roseanne Reno Exercises - Standing Hip Abduction with Unilateral Counter Support  - 2 x daily - 7 x weekly - 1 sets - 10 reps - Standing Hip Extension  - 2 x daily - 7 x weekly - 1 sets - 10 reps - Standing March with Counter Support  - 2 x daily - 7 x weekly - 1 sets - 10 reps  Date: 04/05/2022 Prepared by: Josue Hector Exercises - Seated March  - 2-3 x daily - 7 x weekly - 2 sets - 10 reps - Seated Long Arc Quad  - 2-3 x daily - 7 x weekly - 2 sets - 10 reps - Sit to Stand with Counter Support  - 2-3 x daily - 7 x weekly - 2 sets - 10 reps  GOALS: SHORT TERM GOALS: Target date: 05/03/2022  Patient will be independent with initial HEP and self-management strategies to improve functional outcomes Baseline:  Goal status: IN PROGRESS   2.  Patient will improve STS to < 15 seconds to demo improved functional strength and reduced risk for falls.  Baseline: 20.88 sec with UEs Goal status: IN PROGRESS  LONG TERM GOALS: Target date: 05/31/2022  Patient will be independent with advanced HEP and self-management strategies to improve functional outcomes Baseline:  Goal status: IN PROGRESS  2.  Patient will improve STS to < 12 seconds to demo improved functional strength and reduced risk for falls.  Baseline: 20.88 sec with UEs Goal status: IN PROGRESS  3. Patient will have equal to or > 4/5 MMT throughout BLE to improve ability to perform functional mobility, stair ambulation and ADLs.  Baseline: See MMT Goal status: IN PROGRESS  4. Patient will decreased TUG time to < 12 seconds using LRAD to demonstrate improved balance and ability to perform functional mobility and associated tasks. Baseline: 19.27 sec with RW Goal status: IN PROGRESS  ASSESSMENT:  CLINICAL IMPRESSION: Discussed patients recent symptoms and pain and educated on keeping track of patterns or seeking further medical attention of needed. Began session on nu step for dynamic warm up and conditioning. Continued with BLE  strengthening which is tolerated well and added resisted postural strength with fatigue following. Patient will continue to benefit from physical therapy in order to improve function and reduce impairment.    OBJECTIVE IMPAIRMENTS: Abnormal gait, decreased activity tolerance, decreased balance, decreased endurance, decreased mobility, difficulty walking, decreased strength, and pain.   ACTIVITY LIMITATIONS: carrying, lifting, bending, standing, squatting, stairs, transfers, bed mobility, and locomotion level  PARTICIPATION LIMITATIONS: cleaning, laundry, shopping, community activity, and yard work  PERSONAL FACTORS: Past/current experiences and Time since onset of injury/illness/exacerbation are also affecting patient's functional outcome.   REHAB POTENTIAL: Good  CLINICAL DECISION MAKING: Stable/uncomplicated  EVALUATION COMPLEXITY: Low  PLAN:  PT FREQUENCY: 2x/week  PT DURATION: 8 weeks  PLANNED INTERVENTIONS: Therapeutic exercises, Therapeutic activity, Neuromuscular re-education,  Balance training, Gait training, Patient/Family education, Joint manipulation, Joint mobilization, Stair training, Aquatic Therapy, Dry Needling, Electrical stimulation, Spinal manipulation, Spinal mobilization, Cryotherapy, Moist heat, scar mobilization, Taping, Traction, Ultrasound, Biofeedback, Ionotophoresis '4mg'$ /ml Dexamethasone, and Manual therapy.   PLAN FOR NEXT SESSION: Progress functional LE and core strength. Increase activity tolerance, gait distance and balance as able. Will likely need recert at end of POC - (told patient to schedule additional appointments already)  1:44 PM, 05/24/22 Mearl Latin PT, DPT Physical Therapist at [facility]

## 2022-05-26 ENCOUNTER — Encounter (HOSPITAL_COMMUNITY): Payer: 59

## 2022-06-01 ENCOUNTER — Ambulatory Visit (HOSPITAL_COMMUNITY): Payer: 59 | Admitting: Physical Therapy

## 2022-06-01 DIAGNOSIS — M6281 Muscle weakness (generalized): Secondary | ICD-10-CM

## 2022-06-01 DIAGNOSIS — R278 Other lack of coordination: Secondary | ICD-10-CM

## 2022-06-01 DIAGNOSIS — R29898 Other symptoms and signs involving the musculoskeletal system: Secondary | ICD-10-CM

## 2022-06-01 NOTE — Therapy (Signed)
OUTPATIENT PHYSICAL THERAPY TREATMENT   Patient Name: Hunter Smith MRN: [id number] DOB:1961-04-27, 62 y.o., male Today's Date: 06/01/2022   PCP: Delphina Cahill MD REFERRING PROVIDER: Debroah Baller, NP  END OF SESSION:  PT End of Session - 06/01/22 1602     Visit Number 7    Number of Visits 16    Date for PT Re-Evaluation 05/31/22    Authorization Type [employer] ( 40 visit limit/ 17 remain as of eval)    Authorization - Visit Number 7    Authorization - Number of Visits 17    Progress Note Due on Visit 10    PT Start Time 1601    PT Stop Time 1641    PT Time Calculation (min) 40 min    Activity Tolerance Patient tolerated treatment well;Patient limited by fatigue    Behavior During Therapy [facility] for tasks assessed/performed               Past Medical History:  Diagnosis Date   Cancer ([place])    Port-A-Cath in place 12/28/2019   Past Surgical History:  Procedure Laterality Date   COLONOSCOPY     PORTACATH PLACEMENT Right 01/18/2020   Procedure: INSERTION PORT-A-CATH;  Surgeon: Aviva Signs, MD;  Location: [facility];  Service: General;  Laterality: Right;   Patient Active Problem List   Diagnosis Date Noted   Lobar pneumonia ([place]) 01/19/2022   Severe sepsis ([place]) 01/18/2022   Acute respiratory failure with hypoxia ([place]) 01/18/2022   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[place]) 04/07/2020   Myelodysplastic syndrome, high grade ([place]) 12/28/2019   Goals of care, counseling/discussion 12/28/2019   Port-A-Cath in place 12/28/2019   Macrocytic anemia 12/12/2019   Thrombocytopenia ([place]) 12/12/2019    ONSET DATE: Ongoing  REFERRING DIAG: PT eval/tx for functional mobility, txfers, safety, balance,  THERAPY DIAG:  Muscle weakness (generalized)  Other symptoms and signs involving the musculoskeletal system  Other lack of coordination  Rationale for Evaluation and Treatment: Rehabilitation  SUBJECTIVE:                                                                                                                                                                                              SUBJECTIVE STATEMENT: Patient states no falls since last appt.  Went to oncologist on 19th and is going to orthopedist in February.  Reports pain is "the same" from knees to fit, rib pain Lt and around, Lt UE from collarbone to elbow as well as Rt lower back.  Generalized at 5/10   PERTINENT HISTORY: neuropathy, HX of cancer  PAIN:  Are you having pain? Yes: NPRS scale: 5/10  Pain location: bilateral knees and LE Pain description: dull aching in knees, pressure in abdomen  Aggravating factors: WB, walking  Relieving factors: meds   PRECAUTIONS: None (no driving)  WEIGHT BEARING RESTRICTIONS: No  FALLS: Has patient fallen in last 6 months? No  LIVING ENVIRONMENT: Lives with: lives with their spouse Lives in: House/apartment Stairs: Yes: External: 14 steps; bilateral but cannot reach both Has following equipment at home: Single point cane, Walker - 2 wheeled, Wheelchair (manual), shower chair, and bed side commode  PLOF: Independent with basic ADLs  PATIENT GOALS: get stronger, walk without AD   OBJECTIVE:   DIAGNOSTIC FINDINGS: NA  COGNITION: Overall cognitive status: Within functional limits for tasks assessed   SENSATION: Reports history of peripheral neuropathy    LOWER EXTREMITY MMT:    MMT Right Eval Left Eval  Hip flexion 4- 4-  Hip extension 3- 3-  Hip abduction 3+ 3+  Hip adduction    Hip internal rotation    Hip external rotation    Knee flexion    Knee extension 3+ 3+  Ankle dorsiflexion 3+ 3+  Ankle plantarflexion    Ankle inversion    Ankle eversion    (Blank rows = not tested)  BED MOBILITY:  Sit to supine Modified independence Supine to sit Modified independence Rolling to Right Modified independence Rolling to Left Modified independence  TRANSFERS: Assistive device utilized: None  Sit to stand: Modified independence and  SBA Stand to sit: Modified independence and SBA Chair to chair: Modified independence and SBA  GAIT: Decreased stride, slightly flexed trunk, using RW   FUNCTIONAL TESTS:  5 times sit to stand: 20.8 sec with UE  Timed up and go (TUG): 19.27 sec with RW   TODAY'S TREATMENT:                                                                                                                              DATE:  06/01/22 Nustep: 17mn UE/LE goal SPM >60 level 2 Green theraband Rows 2X10 Green theraband extension 2X10 Green theraband paloff 2x10 each with narrow BOS Alternating march 2X10 bil LE with 1 UE assist Heelraises  2X15 Step up 6 inch 2 x 10 bilateral  Sit to stands from standard chair no UE 10X  05/24/22 Nustep: 478m UE/LE goal SPM >60 level 2 HR 1x 15 Standing march 2x 10 bilateral  Step up 6 inch 2 x 10 bilateral  Standing row GTB 2 x 10  Standing shoulder extension GTB 2 x 10  STS   04/27/22: Nustep: 106m53mUE/LE goal SPM >60 Sidelying Abd 15x Supine: bridge STS no HHA 20in height 10x Heel raise 10x  March 10x with 1 HHA  Sidestep 2RT with HHA inside bars  04/21/22: Heel raise 10x March 10x with 1 HHA  Minisquat 3x, c/o Rt knee pain so DC exercise STS elevated height 21in height Standing hip abduction 2 x 10 bilateral  with 1 HHA Sidestep in  hallway with HHA 2RT (~13f) Seated trunk flexion stretch 1 x 10 5 second holds Seated Wback 10x Tandem stance 2x 30" min guard/ intermittent HHA  04/19/22 Standing hip abduction 2 x 10 bilateral  Standing cone taps 2 x 10 with unilateral HHA  Seated trunk flexion stretch 1 x 10 5 second holds Lateral stepping 6 x 15 feet bilateral  Tandem stance 2 x 30 seconds bilateral  STS 2x 5 from 20" table  04/14/22 Seated:  sit to stand from standard chair with 2" foam no UE 10X  Review of HEP:  LAQ and marching Standing:  Hip abduction 2X10 with UE assist  Hip extension 2X10 with UE assist  Alternating march 2X10 with UE  assist  4" forward step ups 2X10 each with UE assist  4" lateral step ups 2X10 each with UE assist  04/05/22  Eval     PATIENT EDUCATION: Education details: 04/19/22 HEP; EVAL:on eval findings, POC and HEP Person educated: Patient Education method: Explanation Education comprehension: verbalized understanding  HOME EXERCISE PROGRAM: Access Code: [email or web address]: [email or web address]  Date: 04/14/2022 Prepared by: ARoseanne RenoExercises - Standing Hip Abduction with Unilateral Counter Support  - 2 x daily - 7 x weekly - 1 sets - 10 reps - Standing Hip Extension  - 2 x daily - 7 x weekly - 1 sets - 10 reps - Standing March with Counter Support  - 2 x daily - 7 x weekly - 1 sets - 10 reps  Date: 04/05/2022 Prepared by: CJosue HectorExercises - Seated March  - 2-3 x daily - 7 x weekly - 2 sets - 10 reps - Seated Long Arc Quad  - 2-3 x daily - 7 x weekly - 2 sets - 10 reps - Sit to Stand with Counter Support  - 2-3 x daily - 7 x weekly - 2 sets - 10 reps  GOALS: SHORT TERM GOALS: Target date: 05/03/2022  Patient will be independent with initial HEP and self-management strategies to improve functional outcomes Baseline:  Goal status: IN PROGRESS   2.  Patient will improve STS to < 15 seconds to demo improved functional strength and reduced risk for falls.  Baseline: 20.88 sec with UEs Goal status: IN PROGRESS  LONG TERM GOALS: Target date: 05/31/2022  Patient will be independent with advanced HEP and self-management strategies to improve functional outcomes Baseline:  Goal status: IN PROGRESS  2.  Patient will improve STS to < 12 seconds to demo improved functional strength and reduced risk for falls.  Baseline: 20.88 sec with UEs Goal status: IN PROGRESS  3. Patient will have equal to or > 4/5 MMT throughout BLE to improve ability to perform functional mobility, stair ambulation and ADLs.  Baseline: See MMT Goal status: IN PROGRESS  4. Patient will  decreased TUG time to < 12 seconds using LRAD to demonstrate improved balance and ability to perform functional mobility and associated tasks. Baseline: 19.27 sec with RW Goal status: IN PROGRESS  ASSESSMENT:  CLINICAL IMPRESSION:  Continued with BLE strengthening and functional activity tolerance activities. Began paloff this session following postural strengthening using green theraband with ability to complete in good form with minimal cues.  Fatigue did result in occasional rest breaks during session.  Patient will continue to benefit from physical therapy in order to improve function and reduce impairment.    OBJECTIVE IMPAIRMENTS: Abnormal gait, decreased activity tolerance, decreased balance, decreased endurance, decreased mobility, difficulty walking, decreased strength, and pain.   ACTIVITY LIMITATIONS: carrying,  lifting, bending, standing, squatting, stairs, transfers, bed mobility, and locomotion level  PARTICIPATION LIMITATIONS: cleaning, laundry, shopping, community activity, and yard work  PERSONAL FACTORS: Past/current experiences and Time since onset of injury/illness/exacerbation are also affecting patient's functional outcome.   REHAB POTENTIAL: Good  CLINICAL DECISION MAKING: Stable/uncomplicated  EVALUATION COMPLEXITY: Low  PLAN:  PT FREQUENCY: 2x/week  PT DURATION: 8 weeks  PLANNED INTERVENTIONS: Therapeutic exercises, Therapeutic activity, Neuromuscular re-education, Balance training, Gait training, Patient/Family education, Joint manipulation, Joint mobilization, Stair training, Aquatic Therapy, Dry Needling, Electrical stimulation, Spinal manipulation, Spinal mobilization, Cryotherapy, Moist heat, scar mobilization, Taping, Traction, Ultrasound, Biofeedback, Ionotophoresis '4mg'$ /ml Dexamethasone, and Manual therapy.   PLAN FOR NEXT SESSION: Progress functional LE and core strength. Increase activity tolerance, gait distance and balance as able. Will likely need  recert at end of POC - (told patient to schedule additional appointments already)  4:44 PM, 06/01/22 Teena Irani, PTA/CLT [place] Ph: [phone]

## 2022-06-02 ENCOUNTER — Encounter (HOSPITAL_COMMUNITY): Payer: 59 | Admitting: Occupational Therapy

## 2022-07-14 DIAGNOSIS — D46Z Other myelodysplastic syndromes: Secondary | ICD-10-CM | POA: Diagnosis not present

## 2022-07-14 DIAGNOSIS — J84116 Cryptogenic organizing pneumonia: Secondary | ICD-10-CM | POA: Diagnosis not present

## 2022-07-14 DIAGNOSIS — C9311 Chronic myelomonocytic leukemia, in remission: Secondary | ICD-10-CM | POA: Diagnosis not present

## 2022-07-14 DIAGNOSIS — D469 Myelodysplastic syndrome, unspecified: Secondary | ICD-10-CM | POA: Diagnosis not present

## 2022-07-14 DIAGNOSIS — Z9484 Stem cells transplant status: Secondary | ICD-10-CM | POA: Diagnosis not present

## 2022-07-22 DIAGNOSIS — Z9484 Stem cells transplant status: Secondary | ICD-10-CM | POA: Diagnosis not present

## 2022-07-22 DIAGNOSIS — R911 Solitary pulmonary nodule: Secondary | ICD-10-CM | POA: Diagnosis not present

## 2022-07-22 DIAGNOSIS — K3184 Gastroparesis: Secondary | ICD-10-CM | POA: Diagnosis not present

## 2022-07-22 DIAGNOSIS — E559 Vitamin D deficiency, unspecified: Secondary | ICD-10-CM | POA: Diagnosis not present

## 2022-07-22 DIAGNOSIS — D696 Thrombocytopenia, unspecified: Secondary | ICD-10-CM | POA: Diagnosis not present

## 2022-07-22 DIAGNOSIS — K589 Irritable bowel syndrome without diarrhea: Secondary | ICD-10-CM | POA: Diagnosis not present

## 2022-07-22 DIAGNOSIS — K219 Gastro-esophageal reflux disease without esophagitis: Secondary | ICD-10-CM | POA: Diagnosis not present

## 2022-07-22 DIAGNOSIS — T50905A Adverse effect of unspecified drugs, medicaments and biological substances, initial encounter: Secondary | ICD-10-CM | POA: Diagnosis not present

## 2022-07-22 DIAGNOSIS — C9310 Chronic myelomonocytic leukemia not having achieved remission: Secondary | ICD-10-CM | POA: Diagnosis not present

## 2022-07-22 DIAGNOSIS — M818 Other osteoporosis without current pathological fracture: Secondary | ICD-10-CM | POA: Diagnosis not present

## 2022-07-27 DIAGNOSIS — R69 Illness, unspecified: Secondary | ICD-10-CM | POA: Diagnosis not present

## 2022-08-01 DIAGNOSIS — Z87891 Personal history of nicotine dependence: Secondary | ICD-10-CM | POA: Diagnosis not present

## 2022-08-01 DIAGNOSIS — N390 Urinary tract infection, site not specified: Secondary | ICD-10-CM | POA: Diagnosis not present

## 2022-08-01 DIAGNOSIS — D649 Anemia, unspecified: Secondary | ICD-10-CM | POA: Diagnosis not present

## 2022-08-01 DIAGNOSIS — R300 Dysuria: Secondary | ICD-10-CM | POA: Diagnosis not present

## 2022-08-01 DIAGNOSIS — Z9484 Stem cells transplant status: Secondary | ICD-10-CM | POA: Diagnosis not present

## 2022-08-01 DIAGNOSIS — R3915 Urgency of urination: Secondary | ICD-10-CM | POA: Diagnosis not present

## 2022-08-01 DIAGNOSIS — D696 Thrombocytopenia, unspecified: Secondary | ICD-10-CM | POA: Diagnosis not present

## 2022-08-01 DIAGNOSIS — C9311 Chronic myelomonocytic leukemia, in remission: Secondary | ICD-10-CM | POA: Diagnosis not present

## 2022-08-01 DIAGNOSIS — Z79899 Other long term (current) drug therapy: Secondary | ICD-10-CM | POA: Diagnosis not present

## 2022-08-01 DIAGNOSIS — R350 Frequency of micturition: Secondary | ICD-10-CM | POA: Diagnosis not present

## 2022-08-01 DIAGNOSIS — N10 Acute tubulo-interstitial nephritis: Secondary | ICD-10-CM | POA: Diagnosis not present

## 2022-08-01 DIAGNOSIS — Z8701 Personal history of pneumonia (recurrent): Secondary | ICD-10-CM | POA: Diagnosis not present

## 2022-08-01 DIAGNOSIS — Z87440 Personal history of urinary (tract) infections: Secondary | ICD-10-CM | POA: Diagnosis not present

## 2022-08-01 DIAGNOSIS — D849 Immunodeficiency, unspecified: Secondary | ICD-10-CM | POA: Diagnosis not present

## 2022-08-02 DIAGNOSIS — N390 Urinary tract infection, site not specified: Secondary | ICD-10-CM | POA: Diagnosis not present

## 2022-08-02 DIAGNOSIS — C9311 Chronic myelomonocytic leukemia, in remission: Secondary | ICD-10-CM | POA: Diagnosis not present

## 2022-08-02 DIAGNOSIS — Z9484 Stem cells transplant status: Secondary | ICD-10-CM | POA: Diagnosis not present

## 2022-08-03 DIAGNOSIS — N390 Urinary tract infection, site not specified: Secondary | ICD-10-CM | POA: Diagnosis not present

## 2022-08-04 DIAGNOSIS — N390 Urinary tract infection, site not specified: Secondary | ICD-10-CM | POA: Diagnosis not present

## 2022-08-05 DIAGNOSIS — N390 Urinary tract infection, site not specified: Secondary | ICD-10-CM | POA: Diagnosis not present

## 2022-08-06 DIAGNOSIS — N390 Urinary tract infection, site not specified: Secondary | ICD-10-CM | POA: Diagnosis not present

## 2022-08-07 DIAGNOSIS — N390 Urinary tract infection, site not specified: Secondary | ICD-10-CM | POA: Diagnosis not present

## 2022-08-08 DIAGNOSIS — N390 Urinary tract infection, site not specified: Secondary | ICD-10-CM | POA: Diagnosis not present

## 2022-08-09 DIAGNOSIS — J84116 Cryptogenic organizing pneumonia: Secondary | ICD-10-CM | POA: Diagnosis not present

## 2022-08-09 DIAGNOSIS — Z9484 Stem cells transplant status: Secondary | ICD-10-CM | POA: Diagnosis not present

## 2022-08-09 DIAGNOSIS — C9311 Chronic myelomonocytic leukemia, in remission: Secondary | ICD-10-CM | POA: Diagnosis not present

## 2022-08-09 DIAGNOSIS — N390 Urinary tract infection, site not specified: Secondary | ICD-10-CM | POA: Diagnosis not present

## 2022-08-09 DIAGNOSIS — D469 Myelodysplastic syndrome, unspecified: Secondary | ICD-10-CM | POA: Diagnosis not present

## 2022-08-09 DIAGNOSIS — D801 Nonfamilial hypogammaglobulinemia: Secondary | ICD-10-CM | POA: Diagnosis not present

## 2022-08-09 DIAGNOSIS — D46Z Other myelodysplastic syndromes: Secondary | ICD-10-CM | POA: Diagnosis not present

## 2022-08-10 DIAGNOSIS — N390 Urinary tract infection, site not specified: Secondary | ICD-10-CM | POA: Diagnosis not present

## 2022-08-11 DIAGNOSIS — N390 Urinary tract infection, site not specified: Secondary | ICD-10-CM | POA: Diagnosis not present

## 2022-08-12 DIAGNOSIS — N390 Urinary tract infection, site not specified: Secondary | ICD-10-CM | POA: Diagnosis not present

## 2022-08-13 DIAGNOSIS — N390 Urinary tract infection, site not specified: Secondary | ICD-10-CM | POA: Diagnosis not present

## 2022-08-14 DIAGNOSIS — N390 Urinary tract infection, site not specified: Secondary | ICD-10-CM | POA: Diagnosis not present

## 2022-08-15 DIAGNOSIS — N390 Urinary tract infection, site not specified: Secondary | ICD-10-CM | POA: Diagnosis not present

## 2022-08-16 DIAGNOSIS — N390 Urinary tract infection, site not specified: Secondary | ICD-10-CM | POA: Diagnosis not present

## 2022-08-17 DIAGNOSIS — K582 Mixed irritable bowel syndrome: Secondary | ICD-10-CM | POA: Diagnosis not present

## 2022-08-17 DIAGNOSIS — K219 Gastro-esophageal reflux disease without esophagitis: Secondary | ICD-10-CM | POA: Diagnosis not present

## 2022-08-17 DIAGNOSIS — K3184 Gastroparesis: Secondary | ICD-10-CM | POA: Diagnosis not present

## 2022-08-17 DIAGNOSIS — Z1211 Encounter for screening for malignant neoplasm of colon: Secondary | ICD-10-CM | POA: Diagnosis not present

## 2022-08-19 IMAGING — DX DG CHEST 1V PORT
1 series · 1 of 1 positions shown · non-contrast
Comparison: None.

CLINICAL DATA: 59-year-old male Port-A-Cath placement.
Myelodysplastic syndrome.

EXAM:
PORTABLE CHEST 1 VIEW

[chest ap]
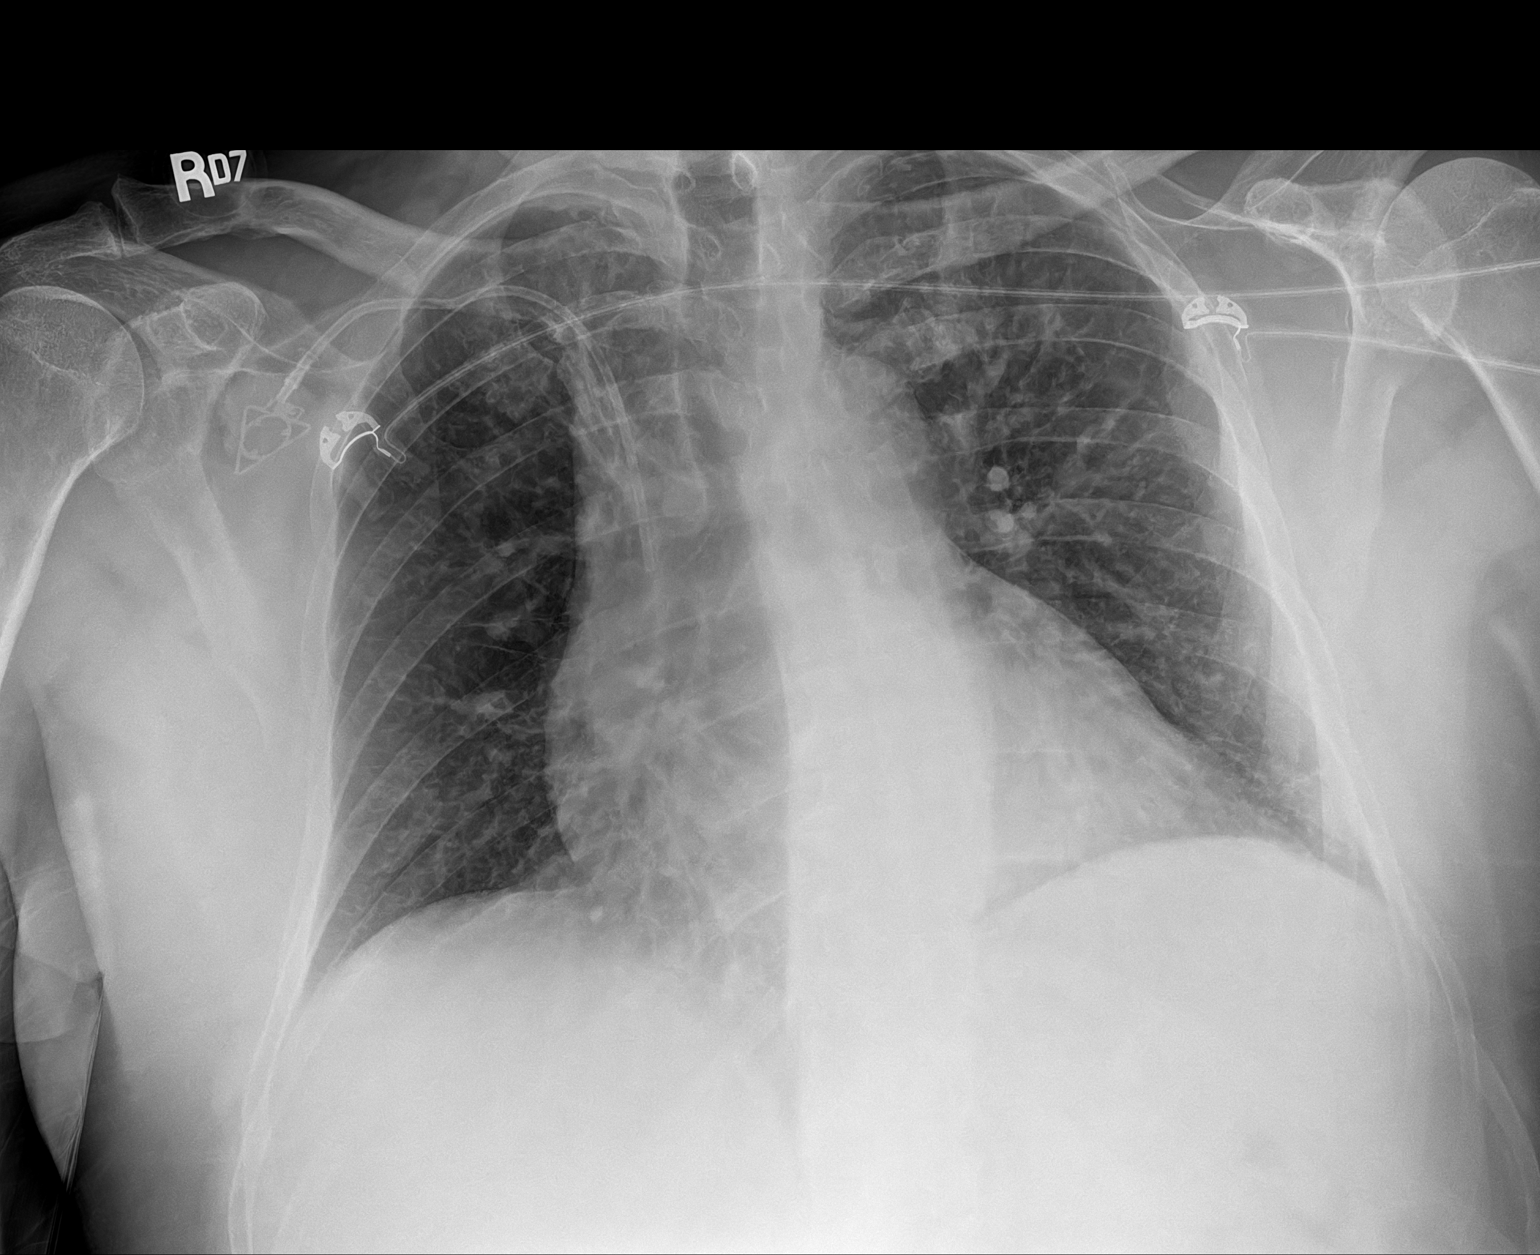

[1 of 1 positions shown; findings below may reference images not displayed]

FINDINGS: Portable AP upright view at 2234 hours. Right chest subclavian
approach power port placed. Catheter tip is at the level of the
carina, SVC. No adverse features identified.

The patient is rotated to the right. No pneumothorax. Mediastinal
contours within normal limits. Visualized tracheal air column is
within normal limits. Allowing for portable technique the lungs are
clear. Visible osseous structures within normal limits.
IMPRESSION: Right chest power port placed. No acute cardiopulmonary abnormality.

## 2022-08-24 DIAGNOSIS — R69 Illness, unspecified: Secondary | ICD-10-CM | POA: Diagnosis not present

## 2022-08-25 DIAGNOSIS — H5213 Myopia, bilateral: Secondary | ICD-10-CM | POA: Diagnosis not present

## 2022-08-28 DIAGNOSIS — Z8052 Family history of malignant neoplasm of bladder: Secondary | ICD-10-CM | POA: Diagnosis not present

## 2022-08-28 DIAGNOSIS — D46Z Other myelodysplastic syndromes: Secondary | ICD-10-CM | POA: Diagnosis not present

## 2022-08-28 DIAGNOSIS — K635 Polyp of colon: Secondary | ICD-10-CM | POA: Diagnosis not present

## 2022-08-28 DIAGNOSIS — Z79899 Other long term (current) drug therapy: Secondary | ICD-10-CM | POA: Diagnosis not present

## 2022-08-28 DIAGNOSIS — Z806 Family history of leukemia: Secondary | ICD-10-CM | POA: Diagnosis not present

## 2022-08-28 DIAGNOSIS — Z1211 Encounter for screening for malignant neoplasm of colon: Secondary | ICD-10-CM | POA: Diagnosis not present

## 2022-08-28 DIAGNOSIS — K219 Gastro-esophageal reflux disease without esophagitis: Secondary | ICD-10-CM | POA: Diagnosis not present

## 2022-08-28 DIAGNOSIS — Z9484 Stem cells transplant status: Secondary | ICD-10-CM | POA: Diagnosis not present

## 2022-08-28 DIAGNOSIS — K5730 Diverticulosis of large intestine without perforation or abscess without bleeding: Secondary | ICD-10-CM | POA: Diagnosis not present

## 2022-08-28 DIAGNOSIS — D127 Benign neoplasm of rectosigmoid junction: Secondary | ICD-10-CM | POA: Diagnosis not present

## 2022-08-28 DIAGNOSIS — C9311 Chronic myelomonocytic leukemia, in remission: Secondary | ICD-10-CM | POA: Diagnosis not present

## 2022-08-28 DIAGNOSIS — D609 Acquired pure red cell aplasia, unspecified: Secondary | ICD-10-CM | POA: Diagnosis not present

## 2022-08-28 DIAGNOSIS — D124 Benign neoplasm of descending colon: Secondary | ICD-10-CM | POA: Diagnosis not present

## 2022-08-30 DIAGNOSIS — M7542 Impingement syndrome of left shoulder: Secondary | ICD-10-CM | POA: Diagnosis not present

## 2022-08-30 DIAGNOSIS — G620 Drug-induced polyneuropathy: Secondary | ICD-10-CM | POA: Diagnosis not present

## 2022-08-30 DIAGNOSIS — M25512 Pain in left shoulder: Secondary | ICD-10-CM | POA: Diagnosis not present

## 2022-08-30 DIAGNOSIS — T451X5A Adverse effect of antineoplastic and immunosuppressive drugs, initial encounter: Secondary | ICD-10-CM | POA: Diagnosis not present

## 2022-08-30 DIAGNOSIS — G8929 Other chronic pain: Secondary | ICD-10-CM | POA: Diagnosis not present

## 2022-08-30 DIAGNOSIS — M19012 Primary osteoarthritis, left shoulder: Secondary | ICD-10-CM | POA: Diagnosis not present

## 2022-08-30 DIAGNOSIS — M75102 Unspecified rotator cuff tear or rupture of left shoulder, not specified as traumatic: Secondary | ICD-10-CM | POA: Diagnosis not present

## 2022-08-30 DIAGNOSIS — Z79899 Other long term (current) drug therapy: Secondary | ICD-10-CM | POA: Diagnosis not present

## 2022-09-01 DIAGNOSIS — F330 Major depressive disorder, recurrent, mild: Secondary | ICD-10-CM | POA: Diagnosis not present

## 2022-09-01 DIAGNOSIS — K219 Gastro-esophageal reflux disease without esophagitis: Secondary | ICD-10-CM | POA: Diagnosis not present

## 2022-09-01 DIAGNOSIS — K3184 Gastroparesis: Secondary | ICD-10-CM | POA: Diagnosis not present

## 2022-09-01 DIAGNOSIS — Z809 Family history of malignant neoplasm, unspecified: Secondary | ICD-10-CM | POA: Diagnosis not present

## 2022-09-01 DIAGNOSIS — Z9181 History of falling: Secondary | ICD-10-CM | POA: Diagnosis not present

## 2022-09-01 DIAGNOSIS — Z008 Encounter for other general examination: Secondary | ICD-10-CM | POA: Diagnosis not present

## 2022-09-01 DIAGNOSIS — G629 Polyneuropathy, unspecified: Secondary | ICD-10-CM | POA: Diagnosis not present

## 2022-09-01 DIAGNOSIS — J9610 Chronic respiratory failure, unspecified whether with hypoxia or hypercapnia: Secondary | ICD-10-CM | POA: Diagnosis not present

## 2022-09-01 DIAGNOSIS — K5900 Constipation, unspecified: Secondary | ICD-10-CM | POA: Diagnosis not present

## 2022-09-01 DIAGNOSIS — Z7951 Long term (current) use of inhaled steroids: Secondary | ICD-10-CM | POA: Diagnosis not present

## 2022-09-01 DIAGNOSIS — I10 Essential (primary) hypertension: Secondary | ICD-10-CM | POA: Diagnosis not present

## 2022-09-01 DIAGNOSIS — Z792 Long term (current) use of antibiotics: Secondary | ICD-10-CM | POA: Diagnosis not present

## 2022-09-01 DIAGNOSIS — M810 Age-related osteoporosis without current pathological fracture: Secondary | ICD-10-CM | POA: Diagnosis not present

## 2022-09-06 DIAGNOSIS — D6101 Constitutional (pure) red blood cell aplasia: Secondary | ICD-10-CM | POA: Diagnosis not present

## 2022-09-06 DIAGNOSIS — D46Z Other myelodysplastic syndromes: Secondary | ICD-10-CM | POA: Diagnosis not present

## 2022-09-06 DIAGNOSIS — D801 Nonfamilial hypogammaglobulinemia: Secondary | ICD-10-CM | POA: Diagnosis not present

## 2022-09-06 DIAGNOSIS — Z9484 Stem cells transplant status: Secondary | ICD-10-CM | POA: Diagnosis not present

## 2022-09-06 DIAGNOSIS — J84116 Cryptogenic organizing pneumonia: Secondary | ICD-10-CM | POA: Diagnosis not present

## 2022-09-06 DIAGNOSIS — C9311 Chronic myelomonocytic leukemia, in remission: Secondary | ICD-10-CM | POA: Diagnosis not present

## 2022-09-06 DIAGNOSIS — D469 Myelodysplastic syndrome, unspecified: Secondary | ICD-10-CM | POA: Diagnosis not present

## 2022-09-07 DIAGNOSIS — M818 Other osteoporosis without current pathological fracture: Secondary | ICD-10-CM | POA: Diagnosis not present

## 2022-09-07 DIAGNOSIS — T50905A Adverse effect of unspecified drugs, medicaments and biological substances, initial encounter: Secondary | ICD-10-CM | POA: Diagnosis not present

## 2022-09-07 DIAGNOSIS — G629 Polyneuropathy, unspecified: Secondary | ICD-10-CM | POA: Diagnosis not present

## 2022-09-17 IMAGING — DX DG CHEST 1V PORT
1 series · 1 of 1 positions shown · non-contrast
Comparison: 01/18/2020

CLINICAL DATA: Fever

EXAM:
PORTABLE CHEST 1 VIEW

[chest ap]
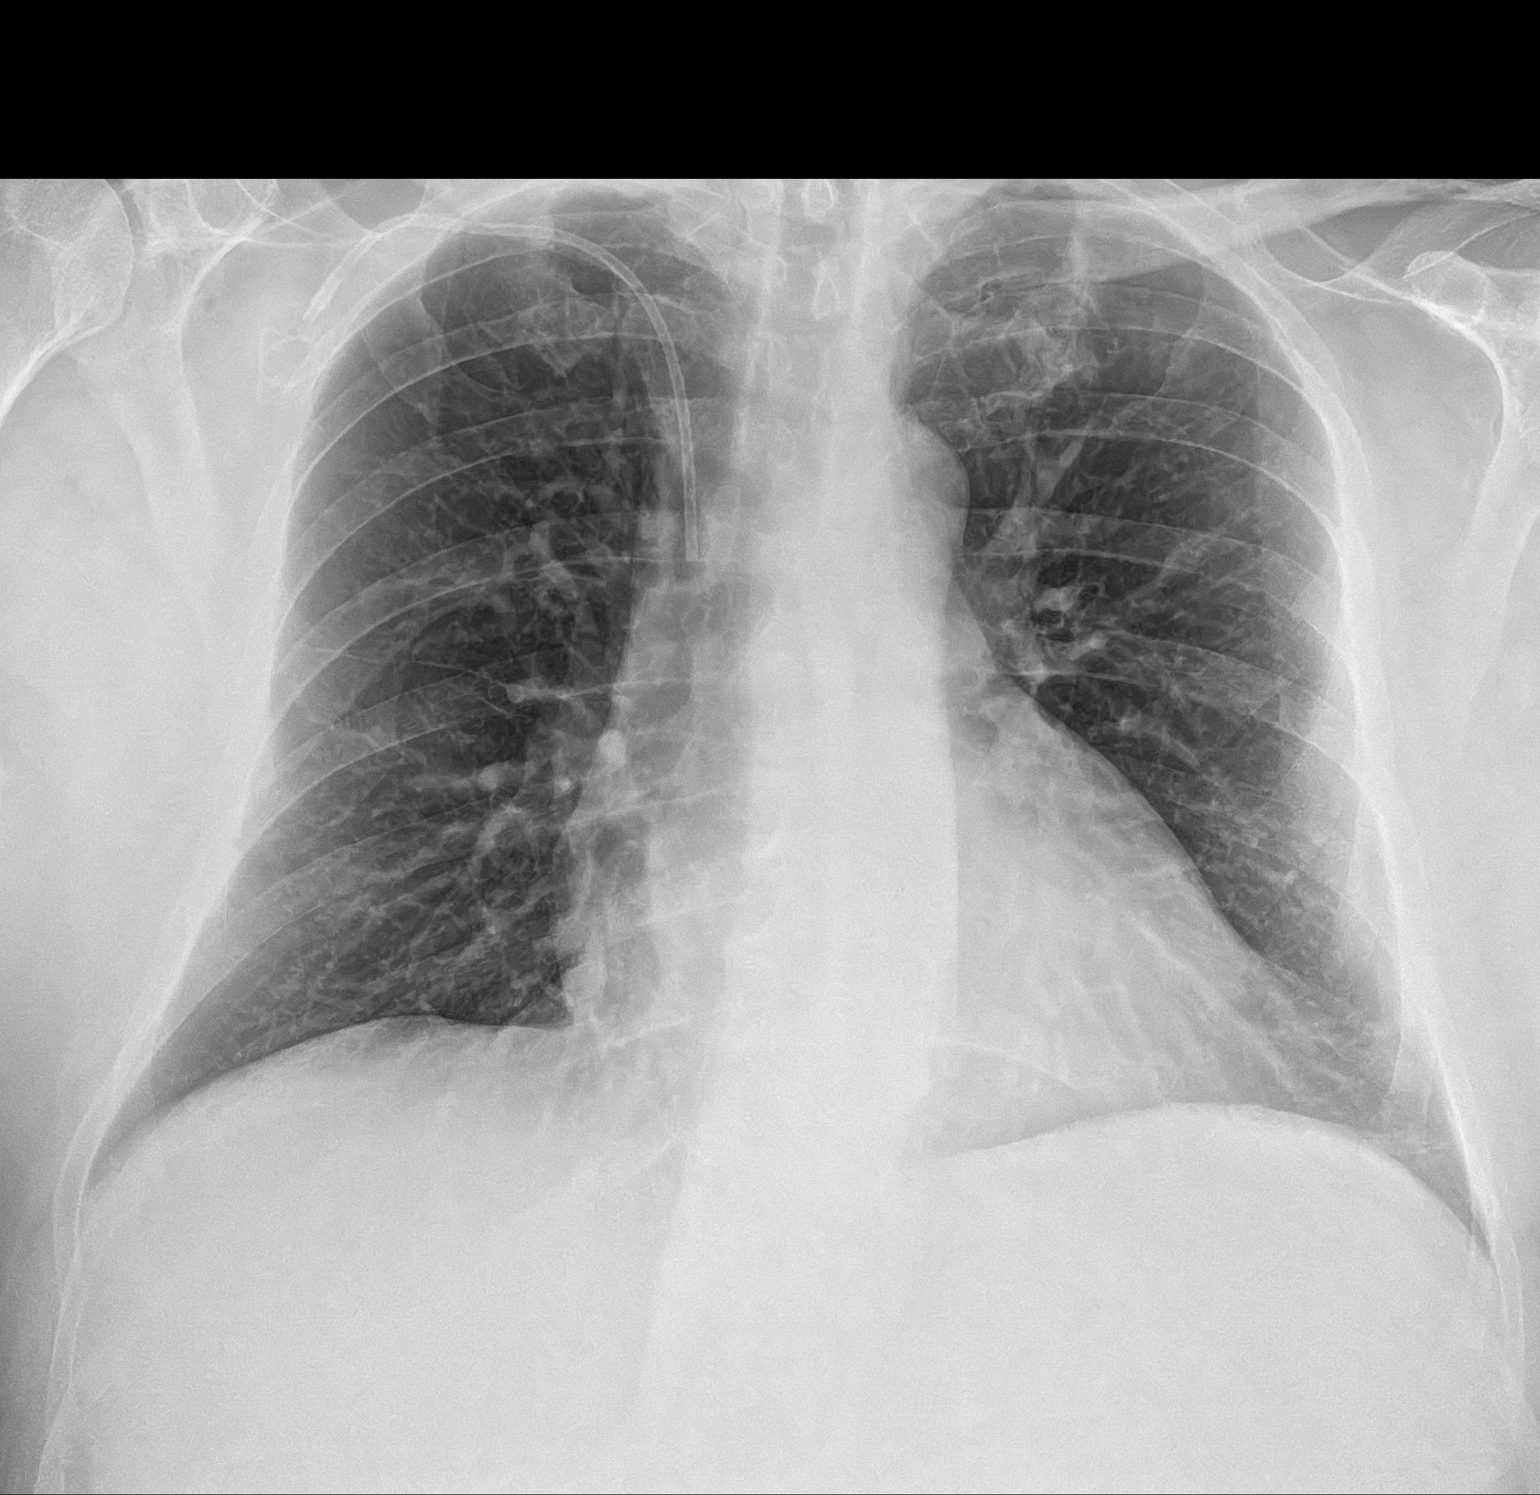

[1 of 1 positions shown; findings below may reference images not displayed]

FINDINGS: Lungs are clear. No pneumothorax or pleural effusion. Cardiac size
within normal limits. Right subclavian chest port tip is seen within
the superior vena cava, unchanged. Pulmonary vascularity is normal.
No acute bone abnormality.
IMPRESSION: No active disease.

## 2022-09-21 DIAGNOSIS — Z87891 Personal history of nicotine dependence: Secondary | ICD-10-CM | POA: Diagnosis not present

## 2022-09-21 DIAGNOSIS — J84116 Cryptogenic organizing pneumonia: Secondary | ICD-10-CM | POA: Diagnosis not present

## 2022-09-21 DIAGNOSIS — J479 Bronchiectasis, uncomplicated: Secondary | ICD-10-CM | POA: Diagnosis not present

## 2022-09-21 DIAGNOSIS — Z9484 Stem cells transplant status: Secondary | ICD-10-CM | POA: Diagnosis not present

## 2022-09-21 DIAGNOSIS — D469 Myelodysplastic syndrome, unspecified: Secondary | ICD-10-CM | POA: Diagnosis not present

## 2022-09-21 DIAGNOSIS — I3139 Other pericardial effusion (noninflammatory): Secondary | ICD-10-CM | POA: Diagnosis not present

## 2022-09-24 DIAGNOSIS — Z9484 Stem cells transplant status: Secondary | ICD-10-CM | POA: Diagnosis not present

## 2022-09-24 DIAGNOSIS — Z87891 Personal history of nicotine dependence: Secondary | ICD-10-CM | POA: Diagnosis not present

## 2022-09-24 DIAGNOSIS — J84116 Cryptogenic organizing pneumonia: Secondary | ICD-10-CM | POA: Diagnosis not present

## 2022-09-27 DIAGNOSIS — J84116 Cryptogenic organizing pneumonia: Secondary | ICD-10-CM | POA: Diagnosis not present

## 2022-09-27 DIAGNOSIS — D46Z Other myelodysplastic syndromes: Secondary | ICD-10-CM | POA: Diagnosis not present

## 2022-09-27 DIAGNOSIS — D469 Myelodysplastic syndrome, unspecified: Secondary | ICD-10-CM | POA: Diagnosis not present

## 2022-09-27 DIAGNOSIS — G8929 Other chronic pain: Secondary | ICD-10-CM | POA: Diagnosis not present

## 2022-09-27 DIAGNOSIS — Z9484 Stem cells transplant status: Secondary | ICD-10-CM | POA: Diagnosis not present

## 2022-09-27 DIAGNOSIS — M792 Neuralgia and neuritis, unspecified: Secondary | ICD-10-CM | POA: Diagnosis not present

## 2022-09-27 DIAGNOSIS — R2689 Other abnormalities of gait and mobility: Secondary | ICD-10-CM | POA: Diagnosis not present

## 2022-09-27 DIAGNOSIS — R296 Repeated falls: Secondary | ICD-10-CM | POA: Diagnosis not present

## 2022-09-27 DIAGNOSIS — M25512 Pain in left shoulder: Secondary | ICD-10-CM | POA: Diagnosis not present

## 2022-10-09 DIAGNOSIS — M25512 Pain in left shoulder: Secondary | ICD-10-CM | POA: Diagnosis not present

## 2022-10-09 DIAGNOSIS — M19012 Primary osteoarthritis, left shoulder: Secondary | ICD-10-CM | POA: Diagnosis not present

## 2022-10-09 DIAGNOSIS — M7552 Bursitis of left shoulder: Secondary | ICD-10-CM | POA: Diagnosis not present

## 2022-10-09 DIAGNOSIS — G8929 Other chronic pain: Secondary | ICD-10-CM | POA: Diagnosis not present

## 2022-10-09 DIAGNOSIS — M7522 Bicipital tendinitis, left shoulder: Secondary | ICD-10-CM | POA: Diagnosis not present

## 2022-10-25 DIAGNOSIS — D46Z Other myelodysplastic syndromes: Secondary | ICD-10-CM | POA: Diagnosis not present

## 2022-10-25 DIAGNOSIS — R7301 Impaired fasting glucose: Secondary | ICD-10-CM | POA: Diagnosis not present

## 2022-10-25 DIAGNOSIS — D469 Myelodysplastic syndrome, unspecified: Secondary | ICD-10-CM | POA: Diagnosis not present

## 2022-10-25 DIAGNOSIS — Z9484 Stem cells transplant status: Secondary | ICD-10-CM | POA: Diagnosis not present

## 2022-10-25 DIAGNOSIS — C9311 Chronic myelomonocytic leukemia, in remission: Secondary | ICD-10-CM | POA: Diagnosis not present

## 2022-10-25 DIAGNOSIS — E782 Mixed hyperlipidemia: Secondary | ICD-10-CM | POA: Diagnosis not present

## 2022-10-25 DIAGNOSIS — Z125 Encounter for screening for malignant neoplasm of prostate: Secondary | ICD-10-CM | POA: Diagnosis not present

## 2022-10-28 DIAGNOSIS — J982 Interstitial emphysema: Secondary | ICD-10-CM | POA: Diagnosis not present

## 2022-10-28 DIAGNOSIS — D649 Anemia, unspecified: Secondary | ICD-10-CM | POA: Diagnosis not present

## 2022-10-28 DIAGNOSIS — I10 Essential (primary) hypertension: Secondary | ICD-10-CM | POA: Diagnosis not present

## 2022-10-28 DIAGNOSIS — M25561 Pain in right knee: Secondary | ICD-10-CM | POA: Diagnosis not present

## 2022-10-28 DIAGNOSIS — M25562 Pain in left knee: Secondary | ICD-10-CM | POA: Diagnosis not present

## 2022-10-28 DIAGNOSIS — C9310 Chronic myelomonocytic leukemia not having achieved remission: Secondary | ICD-10-CM | POA: Diagnosis not present

## 2022-10-28 DIAGNOSIS — R718 Other abnormality of red blood cells: Secondary | ICD-10-CM | POA: Diagnosis not present

## 2022-10-28 DIAGNOSIS — M170 Bilateral primary osteoarthritis of knee: Secondary | ICD-10-CM | POA: Diagnosis not present

## 2022-10-28 DIAGNOSIS — J80 Acute respiratory distress syndrome: Secondary | ICD-10-CM | POA: Diagnosis not present

## 2022-10-28 DIAGNOSIS — M19079 Primary osteoarthritis, unspecified ankle and foot: Secondary | ICD-10-CM | POA: Diagnosis not present

## 2022-10-28 DIAGNOSIS — R339 Retention of urine, unspecified: Secondary | ICD-10-CM | POA: Diagnosis not present

## 2022-11-01 DIAGNOSIS — G620 Drug-induced polyneuropathy: Secondary | ICD-10-CM | POA: Diagnosis not present

## 2022-11-01 DIAGNOSIS — M25511 Pain in right shoulder: Secondary | ICD-10-CM | POA: Diagnosis not present

## 2022-11-01 DIAGNOSIS — M25512 Pain in left shoulder: Secondary | ICD-10-CM | POA: Diagnosis not present

## 2022-11-01 DIAGNOSIS — M79605 Pain in left leg: Secondary | ICD-10-CM | POA: Diagnosis not present

## 2022-11-01 DIAGNOSIS — M7542 Impingement syndrome of left shoulder: Secondary | ICD-10-CM | POA: Diagnosis not present

## 2022-11-01 DIAGNOSIS — Z79899 Other long term (current) drug therapy: Secondary | ICD-10-CM | POA: Diagnosis not present

## 2022-11-01 DIAGNOSIS — G8929 Other chronic pain: Secondary | ICD-10-CM | POA: Diagnosis not present

## 2022-11-01 DIAGNOSIS — T451X5A Adverse effect of antineoplastic and immunosuppressive drugs, initial encounter: Secondary | ICD-10-CM | POA: Diagnosis not present

## 2022-11-01 DIAGNOSIS — M19012 Primary osteoarthritis, left shoulder: Secondary | ICD-10-CM | POA: Diagnosis not present

## 2022-11-22 DIAGNOSIS — C9311 Chronic myelomonocytic leukemia, in remission: Secondary | ICD-10-CM | POA: Diagnosis not present

## 2022-11-22 DIAGNOSIS — Z9484 Stem cells transplant status: Secondary | ICD-10-CM | POA: Diagnosis not present

## 2022-11-22 DIAGNOSIS — D46Z Other myelodysplastic syndromes: Secondary | ICD-10-CM | POA: Diagnosis not present

## 2022-11-22 DIAGNOSIS — J84116 Cryptogenic organizing pneumonia: Secondary | ICD-10-CM | POA: Diagnosis not present

## 2022-11-22 DIAGNOSIS — Z23 Encounter for immunization: Secondary | ICD-10-CM | POA: Diagnosis not present

## 2022-11-22 DIAGNOSIS — D469 Myelodysplastic syndrome, unspecified: Secondary | ICD-10-CM | POA: Diagnosis not present

## 2022-12-08 DIAGNOSIS — Z9484 Stem cells transplant status: Secondary | ICD-10-CM | POA: Diagnosis not present

## 2022-12-08 DIAGNOSIS — M792 Neuralgia and neuritis, unspecified: Secondary | ICD-10-CM | POA: Diagnosis not present

## 2022-12-08 DIAGNOSIS — R2689 Other abnormalities of gait and mobility: Secondary | ICD-10-CM | POA: Diagnosis not present

## 2022-12-08 DIAGNOSIS — R296 Repeated falls: Secondary | ICD-10-CM | POA: Diagnosis not present

## 2022-12-20 DIAGNOSIS — Z9484 Stem cells transplant status: Secondary | ICD-10-CM | POA: Diagnosis not present

## 2022-12-20 DIAGNOSIS — D469 Myelodysplastic syndrome, unspecified: Secondary | ICD-10-CM | POA: Diagnosis not present

## 2022-12-20 DIAGNOSIS — D46Z Other myelodysplastic syndromes: Secondary | ICD-10-CM | POA: Diagnosis not present

## 2022-12-29 DIAGNOSIS — Z9484 Stem cells transplant status: Secondary | ICD-10-CM | POA: Diagnosis not present

## 2022-12-29 DIAGNOSIS — R208 Other disturbances of skin sensation: Secondary | ICD-10-CM | POA: Diagnosis not present

## 2022-12-29 DIAGNOSIS — M79671 Pain in right foot: Secondary | ICD-10-CM | POA: Diagnosis not present

## 2022-12-29 DIAGNOSIS — M79672 Pain in left foot: Secondary | ICD-10-CM | POA: Diagnosis not present

## 2022-12-29 DIAGNOSIS — M792 Neuralgia and neuritis, unspecified: Secondary | ICD-10-CM | POA: Diagnosis not present

## 2022-12-29 DIAGNOSIS — R2689 Other abnormalities of gait and mobility: Secondary | ICD-10-CM | POA: Diagnosis not present

## 2023-01-05 DIAGNOSIS — M4727 Other spondylosis with radiculopathy, lumbosacral region: Secondary | ICD-10-CM | POA: Diagnosis not present

## 2023-01-05 DIAGNOSIS — M47812 Spondylosis without myelopathy or radiculopathy, cervical region: Secondary | ICD-10-CM | POA: Diagnosis not present

## 2023-01-05 DIAGNOSIS — M5116 Intervertebral disc disorders with radiculopathy, lumbar region: Secondary | ICD-10-CM | POA: Diagnosis not present

## 2023-01-05 DIAGNOSIS — M5030 Other cervical disc degeneration, unspecified cervical region: Secondary | ICD-10-CM | POA: Diagnosis not present

## 2023-01-05 DIAGNOSIS — R2689 Other abnormalities of gait and mobility: Secondary | ICD-10-CM | POA: Diagnosis not present

## 2023-01-05 DIAGNOSIS — M5117 Intervertebral disc disorders with radiculopathy, lumbosacral region: Secondary | ICD-10-CM | POA: Diagnosis not present

## 2023-01-05 DIAGNOSIS — M4802 Spinal stenosis, cervical region: Secondary | ICD-10-CM | POA: Diagnosis not present

## 2023-01-05 DIAGNOSIS — M4726 Other spondylosis with radiculopathy, lumbar region: Secondary | ICD-10-CM | POA: Diagnosis not present

## 2023-01-05 DIAGNOSIS — M792 Neuralgia and neuritis, unspecified: Secondary | ICD-10-CM | POA: Diagnosis not present

## 2023-01-05 DIAGNOSIS — R296 Repeated falls: Secondary | ICD-10-CM | POA: Diagnosis not present

## 2023-01-24 DIAGNOSIS — J84116 Cryptogenic organizing pneumonia: Secondary | ICD-10-CM | POA: Diagnosis not present

## 2023-01-24 DIAGNOSIS — Z9484 Stem cells transplant status: Secondary | ICD-10-CM | POA: Diagnosis not present

## 2023-01-24 DIAGNOSIS — D46Z Other myelodysplastic syndromes: Secondary | ICD-10-CM | POA: Diagnosis not present

## 2023-01-24 DIAGNOSIS — R000 Tachycardia, unspecified: Secondary | ICD-10-CM | POA: Diagnosis not present

## 2023-01-26 DIAGNOSIS — Z118 Encounter for screening for other infectious and parasitic diseases: Secondary | ICD-10-CM | POA: Diagnosis not present

## 2023-01-26 DIAGNOSIS — Z9484 Stem cells transplant status: Secondary | ICD-10-CM | POA: Diagnosis not present

## 2023-02-21 DIAGNOSIS — D469 Myelodysplastic syndrome, unspecified: Secondary | ICD-10-CM | POA: Diagnosis not present

## 2023-02-21 DIAGNOSIS — Z9484 Stem cells transplant status: Secondary | ICD-10-CM | POA: Diagnosis not present

## 2023-02-21 DIAGNOSIS — C9311 Chronic myelomonocytic leukemia, in remission: Secondary | ICD-10-CM | POA: Diagnosis not present

## 2023-03-09 DIAGNOSIS — M818 Other osteoporosis without current pathological fracture: Secondary | ICD-10-CM | POA: Diagnosis not present

## 2023-03-09 DIAGNOSIS — Z5181 Encounter for therapeutic drug level monitoring: Secondary | ICD-10-CM | POA: Diagnosis not present

## 2023-03-21 DIAGNOSIS — M792 Neuralgia and neuritis, unspecified: Secondary | ICD-10-CM | POA: Diagnosis not present

## 2023-03-23 DIAGNOSIS — M792 Neuralgia and neuritis, unspecified: Secondary | ICD-10-CM | POA: Diagnosis not present

## 2023-03-29 DIAGNOSIS — K219 Gastro-esophageal reflux disease without esophagitis: Secondary | ICD-10-CM | POA: Diagnosis not present

## 2023-03-29 DIAGNOSIS — C9311 Chronic myelomonocytic leukemia, in remission: Secondary | ICD-10-CM | POA: Diagnosis not present

## 2023-03-29 DIAGNOSIS — G629 Polyneuropathy, unspecified: Secondary | ICD-10-CM | POA: Diagnosis not present

## 2023-03-29 DIAGNOSIS — R000 Tachycardia, unspecified: Secondary | ICD-10-CM | POA: Diagnosis not present

## 2023-03-29 DIAGNOSIS — D649 Anemia, unspecified: Secondary | ICD-10-CM | POA: Diagnosis not present

## 2023-03-29 DIAGNOSIS — Z9484 Stem cells transplant status: Secondary | ICD-10-CM | POA: Diagnosis not present

## 2023-03-29 DIAGNOSIS — I471 Supraventricular tachycardia, unspecified: Secondary | ICD-10-CM | POA: Diagnosis not present

## 2023-03-29 DIAGNOSIS — R001 Bradycardia, unspecified: Secondary | ICD-10-CM | POA: Diagnosis not present

## 2023-03-29 DIAGNOSIS — R9431 Abnormal electrocardiogram [ECG] [EKG]: Secondary | ICD-10-CM | POA: Diagnosis not present

## 2023-03-29 DIAGNOSIS — I493 Ventricular premature depolarization: Secondary | ICD-10-CM | POA: Diagnosis not present

## 2023-03-29 DIAGNOSIS — Z79899 Other long term (current) drug therapy: Secondary | ICD-10-CM | POA: Diagnosis not present

## 2023-03-29 DIAGNOSIS — R002 Palpitations: Secondary | ICD-10-CM | POA: Diagnosis not present

## 2023-03-31 DIAGNOSIS — C4441 Basal cell carcinoma of skin of scalp and neck: Secondary | ICD-10-CM | POA: Diagnosis not present

## 2023-03-31 DIAGNOSIS — Z85828 Personal history of other malignant neoplasm of skin: Secondary | ICD-10-CM | POA: Diagnosis not present

## 2023-03-31 DIAGNOSIS — Z08 Encounter for follow-up examination after completed treatment for malignant neoplasm: Secondary | ICD-10-CM | POA: Diagnosis not present

## 2023-04-02 DIAGNOSIS — R001 Bradycardia, unspecified: Secondary | ICD-10-CM | POA: Diagnosis not present

## 2023-04-20 DIAGNOSIS — R002 Palpitations: Secondary | ICD-10-CM | POA: Diagnosis not present

## 2023-04-21 DIAGNOSIS — I471 Supraventricular tachycardia, unspecified: Secondary | ICD-10-CM | POA: Diagnosis not present

## 2023-04-25 DIAGNOSIS — Z9484 Stem cells transplant status: Secondary | ICD-10-CM | POA: Diagnosis not present

## 2023-04-25 DIAGNOSIS — D469 Myelodysplastic syndrome, unspecified: Secondary | ICD-10-CM | POA: Diagnosis not present

## 2023-04-25 DIAGNOSIS — D46Z Other myelodysplastic syndromes: Secondary | ICD-10-CM | POA: Diagnosis not present

## 2023-04-25 DIAGNOSIS — C9311 Chronic myelomonocytic leukemia, in remission: Secondary | ICD-10-CM | POA: Diagnosis not present

## 2023-04-26 DIAGNOSIS — E782 Mixed hyperlipidemia: Secondary | ICD-10-CM | POA: Diagnosis not present

## 2023-04-26 DIAGNOSIS — R7301 Impaired fasting glucose: Secondary | ICD-10-CM | POA: Diagnosis not present

## 2023-04-26 DIAGNOSIS — J80 Acute respiratory distress syndrome: Secondary | ICD-10-CM | POA: Diagnosis not present

## 2023-04-29 DIAGNOSIS — R739 Hyperglycemia, unspecified: Secondary | ICD-10-CM | POA: Diagnosis not present

## 2023-04-29 DIAGNOSIS — R339 Retention of urine, unspecified: Secondary | ICD-10-CM | POA: Diagnosis not present

## 2023-04-29 DIAGNOSIS — I10 Essential (primary) hypertension: Secondary | ICD-10-CM | POA: Diagnosis not present

## 2023-04-29 DIAGNOSIS — R718 Other abnormality of red blood cells: Secondary | ICD-10-CM | POA: Diagnosis not present

## 2023-04-29 DIAGNOSIS — C9310 Chronic myelomonocytic leukemia not having achieved remission: Secondary | ICD-10-CM | POA: Diagnosis not present

## 2023-04-29 DIAGNOSIS — R58 Hemorrhage, not elsewhere classified: Secondary | ICD-10-CM | POA: Diagnosis not present

## 2023-04-29 DIAGNOSIS — D649 Anemia, unspecified: Secondary | ICD-10-CM | POA: Diagnosis not present

## 2023-04-29 DIAGNOSIS — M25561 Pain in right knee: Secondary | ICD-10-CM | POA: Diagnosis not present

## 2023-04-29 DIAGNOSIS — J982 Interstitial emphysema: Secondary | ICD-10-CM | POA: Diagnosis not present

## 2023-04-29 DIAGNOSIS — E782 Mixed hyperlipidemia: Secondary | ICD-10-CM | POA: Diagnosis not present

## 2023-04-29 DIAGNOSIS — M25562 Pain in left knee: Secondary | ICD-10-CM | POA: Diagnosis not present

## 2023-04-29 DIAGNOSIS — J80 Acute respiratory distress syndrome: Secondary | ICD-10-CM | POA: Diagnosis not present

## 2023-05-16 ENCOUNTER — Other Ambulatory Visit (HOSPITAL_COMMUNITY): Payer: Self-pay | Admitting: Nurse Practitioner

## 2023-05-16 ENCOUNTER — Ambulatory Visit (HOSPITAL_COMMUNITY)
Admission: RE | Admit: 2023-05-16 | Discharge: 2023-05-16 | Disposition: A | Discharge status: Home / Self Care | Payer: Medicare HMO | Source: Ambulatory Visit | Attending: Nurse Practitioner | Admitting: Nurse Practitioner

## 2023-05-16 DIAGNOSIS — R0989 Other specified symptoms and signs involving the circulatory and respiratory systems: Secondary | ICD-10-CM | POA: Diagnosis not present

## 2023-05-16 DIAGNOSIS — R059 Cough, unspecified: Secondary | ICD-10-CM

## 2023-05-16 DIAGNOSIS — Z452 Encounter for adjustment and management of vascular access device: Secondary | ICD-10-CM | POA: Diagnosis not present

## 2023-05-16 DIAGNOSIS — R918 Other nonspecific abnormal finding of lung field: Secondary | ICD-10-CM | POA: Diagnosis not present

## 2023-05-16 DIAGNOSIS — J159 Unspecified bacterial pneumonia: Secondary | ICD-10-CM | POA: Diagnosis not present

## 2023-05-16 DIAGNOSIS — Z20822 Contact with and (suspected) exposure to covid-19: Secondary | ICD-10-CM | POA: Diagnosis not present

## 2023-05-16 DIAGNOSIS — R051 Acute cough: Secondary | ICD-10-CM | POA: Diagnosis not present

## 2023-05-16 DIAGNOSIS — E860 Dehydration: Secondary | ICD-10-CM | POA: Diagnosis not present

## 2023-05-16 DIAGNOSIS — R0600 Dyspnea, unspecified: Secondary | ICD-10-CM | POA: Diagnosis not present

## 2023-05-20 DIAGNOSIS — M810 Age-related osteoporosis without current pathological fracture: Secondary | ICD-10-CM | POA: Diagnosis not present

## 2023-05-20 DIAGNOSIS — Z809 Family history of malignant neoplasm, unspecified: Secondary | ICD-10-CM | POA: Diagnosis not present

## 2023-05-20 DIAGNOSIS — Z5982 Transportation insecurity: Secondary | ICD-10-CM | POA: Diagnosis not present

## 2023-05-20 DIAGNOSIS — Z9484 Stem cells transplant status: Secondary | ICD-10-CM | POA: Diagnosis not present

## 2023-05-20 DIAGNOSIS — Z862 Personal history of diseases of the blood and blood-forming organs and certain disorders involving the immune mechanism: Secondary | ICD-10-CM | POA: Diagnosis not present

## 2023-05-20 DIAGNOSIS — B009 Herpesviral infection, unspecified: Secondary | ICD-10-CM | POA: Diagnosis not present

## 2023-05-20 DIAGNOSIS — R269 Unspecified abnormalities of gait and mobility: Secondary | ICD-10-CM | POA: Diagnosis not present

## 2023-05-20 DIAGNOSIS — K219 Gastro-esophageal reflux disease without esophagitis: Secondary | ICD-10-CM | POA: Diagnosis not present

## 2023-05-20 DIAGNOSIS — Z9181 History of falling: Secondary | ICD-10-CM | POA: Diagnosis not present

## 2023-05-20 DIAGNOSIS — I10 Essential (primary) hypertension: Secondary | ICD-10-CM | POA: Diagnosis not present

## 2023-05-20 DIAGNOSIS — M1990 Unspecified osteoarthritis, unspecified site: Secondary | ICD-10-CM | POA: Diagnosis not present

## 2023-05-20 DIAGNOSIS — Z8249 Family history of ischemic heart disease and other diseases of the circulatory system: Secondary | ICD-10-CM | POA: Diagnosis not present

## 2023-06-02 DIAGNOSIS — Z85828 Personal history of other malignant neoplasm of skin: Secondary | ICD-10-CM | POA: Diagnosis not present

## 2023-06-02 DIAGNOSIS — Z08 Encounter for follow-up examination after completed treatment for malignant neoplasm: Secondary | ICD-10-CM | POA: Diagnosis not present

## 2023-06-06 DIAGNOSIS — D46Z Other myelodysplastic syndromes: Secondary | ICD-10-CM | POA: Diagnosis not present

## 2023-06-06 DIAGNOSIS — C9311 Chronic myelomonocytic leukemia, in remission: Secondary | ICD-10-CM | POA: Diagnosis not present

## 2023-06-06 DIAGNOSIS — D469 Myelodysplastic syndrome, unspecified: Secondary | ICD-10-CM | POA: Diagnosis not present

## 2023-06-06 DIAGNOSIS — Z9484 Stem cells transplant status: Secondary | ICD-10-CM | POA: Diagnosis not present

## 2023-06-10 ENCOUNTER — Other Ambulatory Visit (HOSPITAL_COMMUNITY): Payer: Self-pay | Admitting: Nurse Practitioner

## 2023-06-10 DIAGNOSIS — D46Z Other myelodysplastic syndromes: Secondary | ICD-10-CM | POA: Diagnosis not present

## 2023-06-10 DIAGNOSIS — R942 Abnormal results of pulmonary function studies: Secondary | ICD-10-CM | POA: Diagnosis not present

## 2023-06-10 DIAGNOSIS — Z9484 Stem cells transplant status: Secondary | ICD-10-CM | POA: Diagnosis not present

## 2023-06-10 DIAGNOSIS — R051 Acute cough: Secondary | ICD-10-CM

## 2023-06-10 DIAGNOSIS — Z87891 Personal history of nicotine dependence: Secondary | ICD-10-CM | POA: Diagnosis not present

## 2023-06-15 DIAGNOSIS — Z87891 Personal history of nicotine dependence: Secondary | ICD-10-CM | POA: Diagnosis not present

## 2023-06-15 DIAGNOSIS — J984 Other disorders of lung: Secondary | ICD-10-CM | POA: Diagnosis not present

## 2023-07-25 DIAGNOSIS — D469 Myelodysplastic syndrome, unspecified: Secondary | ICD-10-CM | POA: Diagnosis not present

## 2023-07-25 DIAGNOSIS — R509 Fever, unspecified: Secondary | ICD-10-CM | POA: Diagnosis not present

## 2023-07-25 DIAGNOSIS — C9311 Chronic myelomonocytic leukemia, in remission: Secondary | ICD-10-CM | POA: Diagnosis not present

## 2023-07-25 DIAGNOSIS — Z9484 Stem cells transplant status: Secondary | ICD-10-CM | POA: Diagnosis not present

## 2023-07-28 DIAGNOSIS — J019 Acute sinusitis, unspecified: Secondary | ICD-10-CM | POA: Diagnosis not present

## 2023-07-28 DIAGNOSIS — R000 Tachycardia, unspecified: Secondary | ICD-10-CM | POA: Diagnosis not present

## 2023-08-29 DIAGNOSIS — D469 Myelodysplastic syndrome, unspecified: Secondary | ICD-10-CM | POA: Diagnosis not present

## 2023-08-29 DIAGNOSIS — C9311 Chronic myelomonocytic leukemia, in remission: Secondary | ICD-10-CM | POA: Diagnosis not present

## 2023-08-29 DIAGNOSIS — Z9484 Stem cells transplant status: Secondary | ICD-10-CM | POA: Diagnosis not present

## 2023-08-29 DIAGNOSIS — R509 Fever, unspecified: Secondary | ICD-10-CM | POA: Diagnosis not present

## 2023-08-31 DIAGNOSIS — H04123 Dry eye syndrome of bilateral lacrimal glands: Secondary | ICD-10-CM | POA: Diagnosis not present

## 2023-09-07 DIAGNOSIS — T50905A Adverse effect of unspecified drugs, medicaments and biological substances, initial encounter: Secondary | ICD-10-CM | POA: Diagnosis not present

## 2023-09-07 DIAGNOSIS — M818 Other osteoporosis without current pathological fracture: Secondary | ICD-10-CM | POA: Diagnosis not present

## 2023-09-28 DIAGNOSIS — M818 Other osteoporosis without current pathological fracture: Secondary | ICD-10-CM | POA: Diagnosis not present

## 2023-09-28 DIAGNOSIS — R911 Solitary pulmonary nodule: Secondary | ICD-10-CM | POA: Diagnosis not present

## 2023-09-29 DIAGNOSIS — Z9484 Stem cells transplant status: Secondary | ICD-10-CM | POA: Diagnosis not present

## 2023-09-29 DIAGNOSIS — Z5181 Encounter for therapeutic drug level monitoring: Secondary | ICD-10-CM | POA: Diagnosis not present

## 2023-09-29 DIAGNOSIS — C9311 Chronic myelomonocytic leukemia, in remission: Secondary | ICD-10-CM | POA: Diagnosis not present

## 2023-09-29 DIAGNOSIS — D469 Myelodysplastic syndrome, unspecified: Secondary | ICD-10-CM | POA: Diagnosis not present

## 2023-09-29 DIAGNOSIS — G629 Polyneuropathy, unspecified: Secondary | ICD-10-CM | POA: Diagnosis not present

## 2023-10-04 DIAGNOSIS — R002 Palpitations: Secondary | ICD-10-CM | POA: Diagnosis not present

## 2023-10-21 DIAGNOSIS — E782 Mixed hyperlipidemia: Secondary | ICD-10-CM | POA: Diagnosis not present

## 2023-10-21 DIAGNOSIS — R7301 Impaired fasting glucose: Secondary | ICD-10-CM | POA: Diagnosis not present

## 2023-10-21 DIAGNOSIS — J80 Acute respiratory distress syndrome: Secondary | ICD-10-CM | POA: Diagnosis not present

## 2023-10-24 DIAGNOSIS — M818 Other osteoporosis without current pathological fracture: Secondary | ICD-10-CM | POA: Diagnosis not present

## 2023-10-24 DIAGNOSIS — Z9484 Stem cells transplant status: Secondary | ICD-10-CM | POA: Diagnosis not present

## 2023-10-24 DIAGNOSIS — J84116 Cryptogenic organizing pneumonia: Secondary | ICD-10-CM | POA: Diagnosis not present

## 2023-10-24 DIAGNOSIS — C9311 Chronic myelomonocytic leukemia, in remission: Secondary | ICD-10-CM | POA: Diagnosis not present

## 2023-10-24 DIAGNOSIS — T50905A Adverse effect of unspecified drugs, medicaments and biological substances, initial encounter: Secondary | ICD-10-CM | POA: Diagnosis not present

## 2023-10-28 DIAGNOSIS — J982 Interstitial emphysema: Secondary | ICD-10-CM | POA: Diagnosis not present

## 2023-10-28 DIAGNOSIS — I10 Essential (primary) hypertension: Secondary | ICD-10-CM | POA: Diagnosis not present

## 2023-10-28 DIAGNOSIS — R718 Other abnormality of red blood cells: Secondary | ICD-10-CM | POA: Diagnosis not present

## 2023-10-28 DIAGNOSIS — M25562 Pain in left knee: Secondary | ICD-10-CM | POA: Diagnosis not present

## 2023-10-28 DIAGNOSIS — M25561 Pain in right knee: Secondary | ICD-10-CM | POA: Diagnosis not present

## 2023-10-28 DIAGNOSIS — D649 Anemia, unspecified: Secondary | ICD-10-CM | POA: Diagnosis not present

## 2023-10-28 DIAGNOSIS — E782 Mixed hyperlipidemia: Secondary | ICD-10-CM | POA: Diagnosis not present

## 2023-10-28 DIAGNOSIS — C9310 Chronic myelomonocytic leukemia not having achieved remission: Secondary | ICD-10-CM | POA: Diagnosis not present

## 2023-10-28 DIAGNOSIS — R739 Hyperglycemia, unspecified: Secondary | ICD-10-CM | POA: Diagnosis not present

## 2023-10-28 DIAGNOSIS — J80 Acute respiratory distress syndrome: Secondary | ICD-10-CM | POA: Diagnosis not present

## 2023-10-28 DIAGNOSIS — R58 Hemorrhage, not elsewhere classified: Secondary | ICD-10-CM | POA: Diagnosis not present

## 2023-11-21 DIAGNOSIS — D469 Myelodysplastic syndrome, unspecified: Secondary | ICD-10-CM | POA: Diagnosis not present

## 2023-11-21 DIAGNOSIS — C9311 Chronic myelomonocytic leukemia, in remission: Secondary | ICD-10-CM | POA: Diagnosis not present

## 2023-11-21 DIAGNOSIS — Z9484 Stem cells transplant status: Secondary | ICD-10-CM | POA: Diagnosis not present

## 2023-12-19 DIAGNOSIS — C9311 Chronic myelomonocytic leukemia, in remission: Secondary | ICD-10-CM | POA: Diagnosis not present

## 2023-12-19 DIAGNOSIS — Z9484 Stem cells transplant status: Secondary | ICD-10-CM | POA: Diagnosis not present

## 2023-12-19 DIAGNOSIS — D469 Myelodysplastic syndrome, unspecified: Secondary | ICD-10-CM | POA: Diagnosis not present

## 2024-01-12 DIAGNOSIS — Z85828 Personal history of other malignant neoplasm of skin: Secondary | ICD-10-CM | POA: Diagnosis not present

## 2024-01-12 DIAGNOSIS — Z08 Encounter for follow-up examination after completed treatment for malignant neoplasm: Secondary | ICD-10-CM | POA: Diagnosis not present

## 2024-01-23 DIAGNOSIS — Z9484 Stem cells transplant status: Secondary | ICD-10-CM | POA: Diagnosis not present

## 2024-01-23 DIAGNOSIS — D46Z Other myelodysplastic syndromes: Secondary | ICD-10-CM | POA: Diagnosis not present

## 2024-01-23 DIAGNOSIS — C9311 Chronic myelomonocytic leukemia, in remission: Secondary | ICD-10-CM | POA: Diagnosis not present

## 2024-01-23 DIAGNOSIS — R059 Cough, unspecified: Secondary | ICD-10-CM | POA: Diagnosis not present

## 2024-01-23 DIAGNOSIS — D469 Myelodysplastic syndrome, unspecified: Secondary | ICD-10-CM | POA: Diagnosis not present

## 2024-02-01 DIAGNOSIS — J84116 Cryptogenic organizing pneumonia: Secondary | ICD-10-CM | POA: Diagnosis not present

## 2024-02-02 DIAGNOSIS — I10 Essential (primary) hypertension: Secondary | ICD-10-CM | POA: Diagnosis not present

## 2024-02-02 DIAGNOSIS — J309 Allergic rhinitis, unspecified: Secondary | ICD-10-CM | POA: Diagnosis not present

## 2024-02-02 DIAGNOSIS — K219 Gastro-esophageal reflux disease without esophagitis: Secondary | ICD-10-CM | POA: Diagnosis not present

## 2024-02-02 DIAGNOSIS — U071 COVID-19: Secondary | ICD-10-CM | POA: Diagnosis not present

## 2024-02-02 DIAGNOSIS — C9310 Chronic myelomonocytic leukemia not having achieved remission: Secondary | ICD-10-CM | POA: Diagnosis not present

## 2024-02-02 DIAGNOSIS — R112 Nausea with vomiting, unspecified: Secondary | ICD-10-CM | POA: Diagnosis not present

## 2024-02-13 DIAGNOSIS — C9311 Chronic myelomonocytic leukemia, in remission: Secondary | ICD-10-CM | POA: Diagnosis not present

## 2024-02-13 DIAGNOSIS — K3184 Gastroparesis: Secondary | ICD-10-CM | POA: Diagnosis not present

## 2024-02-13 DIAGNOSIS — K582 Mixed irritable bowel syndrome: Secondary | ICD-10-CM | POA: Diagnosis not present

## 2024-02-13 DIAGNOSIS — K219 Gastro-esophageal reflux disease without esophagitis: Secondary | ICD-10-CM | POA: Diagnosis not present

## 2024-02-13 DIAGNOSIS — T451X5A Adverse effect of antineoplastic and immunosuppressive drugs, initial encounter: Secondary | ICD-10-CM | POA: Diagnosis not present

## 2024-02-13 DIAGNOSIS — R112 Nausea with vomiting, unspecified: Secondary | ICD-10-CM | POA: Diagnosis not present

## 2024-02-20 DIAGNOSIS — R053 Chronic cough: Secondary | ICD-10-CM | POA: Diagnosis not present

## 2024-02-20 DIAGNOSIS — Z9484 Stem cells transplant status: Secondary | ICD-10-CM | POA: Diagnosis not present

## 2024-02-20 DIAGNOSIS — C9311 Chronic myelomonocytic leukemia, in remission: Secondary | ICD-10-CM | POA: Diagnosis not present

## 2024-03-07 DIAGNOSIS — J329 Chronic sinusitis, unspecified: Secondary | ICD-10-CM | POA: Diagnosis not present

## 2024-03-08 DIAGNOSIS — M818 Other osteoporosis without current pathological fracture: Secondary | ICD-10-CM | POA: Diagnosis not present

## 2024-03-19 DIAGNOSIS — Z9484 Stem cells transplant status: Secondary | ICD-10-CM | POA: Diagnosis not present

## 2024-03-19 DIAGNOSIS — D469 Myelodysplastic syndrome, unspecified: Secondary | ICD-10-CM | POA: Diagnosis not present

## 2024-03-19 DIAGNOSIS — D46Z Other myelodysplastic syndromes: Secondary | ICD-10-CM | POA: Diagnosis not present

## 2024-03-19 DIAGNOSIS — C9311 Chronic myelomonocytic leukemia, in remission: Secondary | ICD-10-CM | POA: Diagnosis not present

## 2024-04-12 ENCOUNTER — Encounter: Payer: Self-pay | Admitting: *Deleted

## 2024-04-12 NOTE — Progress Notes (Signed)
 Hunter Smith                                          MRN: [id number]   04/12/2024   The [facility] Quality Team Specialist reviewed this patient medical record for the purposes of chart review for care gap closure. The following were reviewed: chart review for care gap closure-controlling blood pressure.    [facility] Quality Team

## 2024-04-16 DIAGNOSIS — Z9484 Stem cells transplant status: Secondary | ICD-10-CM | POA: Diagnosis not present

## 2024-04-16 DIAGNOSIS — D46Z Other myelodysplastic syndromes: Secondary | ICD-10-CM | POA: Diagnosis not present
